# Patient Record
Sex: Female | Born: 1940 | Race: White | Hispanic: No | Marital: Married | Smoking: Never smoker
Health system: Southern US, Community
[De-identification: ages and names within clinical notes are randomized; demographics above are authoritative.]

## PROBLEM LIST (undated history)

## (undated) DIAGNOSIS — J45909 Unspecified asthma, uncomplicated: Secondary | ICD-10-CM

## (undated) DIAGNOSIS — E7800 Pure hypercholesterolemia, unspecified: Secondary | ICD-10-CM

## (undated) DIAGNOSIS — Z862 Personal history of diseases of the blood and blood-forming organs and certain disorders involving the immune mechanism: Secondary | ICD-10-CM

## (undated) DIAGNOSIS — I2699 Other pulmonary embolism without acute cor pulmonale: Secondary | ICD-10-CM

## (undated) DIAGNOSIS — I639 Cerebral infarction, unspecified: Secondary | ICD-10-CM

## (undated) DIAGNOSIS — G629 Polyneuropathy, unspecified: Secondary | ICD-10-CM

## (undated) DIAGNOSIS — C801 Malignant (primary) neoplasm, unspecified: Secondary | ICD-10-CM

## (undated) DIAGNOSIS — G454 Transient global amnesia: Principal | ICD-10-CM

## (undated) DIAGNOSIS — J189 Pneumonia, unspecified organism: Secondary | ICD-10-CM

## (undated) DIAGNOSIS — C4491 Basal cell carcinoma of skin, unspecified: Secondary | ICD-10-CM

## (undated) DIAGNOSIS — K219 Gastro-esophageal reflux disease without esophagitis: Secondary | ICD-10-CM

## (undated) DIAGNOSIS — E669 Obesity, unspecified: Secondary | ICD-10-CM

## (undated) DIAGNOSIS — J449 Chronic obstructive pulmonary disease, unspecified: Secondary | ICD-10-CM

## (undated) DIAGNOSIS — R112 Nausea with vomiting, unspecified: Secondary | ICD-10-CM

## (undated) DIAGNOSIS — M1990 Unspecified osteoarthritis, unspecified site: Secondary | ICD-10-CM

## (undated) DIAGNOSIS — R002 Palpitations: Secondary | ICD-10-CM

## (undated) DIAGNOSIS — Z9889 Other specified postprocedural states: Secondary | ICD-10-CM

## (undated) DIAGNOSIS — U071 COVID-19: Secondary | ICD-10-CM

## (undated) DIAGNOSIS — G4733 Obstructive sleep apnea (adult) (pediatric): Secondary | ICD-10-CM

## (undated) DIAGNOSIS — R0602 Shortness of breath: Secondary | ICD-10-CM

## (undated) DIAGNOSIS — G459 Transient cerebral ischemic attack, unspecified: Secondary | ICD-10-CM

## (undated) DIAGNOSIS — Z8669 Personal history of other diseases of the nervous system and sense organs: Secondary | ICD-10-CM

## (undated) HISTORY — PX: KNEE SURGERY: SHX244

## (undated) HISTORY — PX: TUBAL LIGATION: SHX77

## (undated) HISTORY — DX: Transient global amnesia: G45.4

## (undated) HISTORY — PX: DILATION AND CURETTAGE OF UTERUS: SHX78

## (undated) HISTORY — PX: TONSILLECTOMY: SUR1361

## (undated) HISTORY — PX: COLONOSCOPY: SHX174

## (undated) HISTORY — PX: LUMBAR FUSION: SHX111

## (undated) HISTORY — PX: BREAST CYST EXCISION: SHX579

## (undated) HISTORY — DX: Obesity, unspecified: E66.9

## (undated) HISTORY — DX: Transient cerebral ischemic attack, unspecified: G45.9

## (undated) HISTORY — PX: BREAST BIOPSY: SHX20

---

## 1969-05-20 HISTORY — PX: FINGER SURGERY: SHX640

## 1989-05-20 DIAGNOSIS — Z8719 Personal history of other diseases of the digestive system: Secondary | ICD-10-CM

## 1989-05-20 HISTORY — DX: Personal history of other diseases of the digestive system: Z87.19

## 1999-02-07 ENCOUNTER — Other Ambulatory Visit: Admission: RE | Admit: 1999-02-07 | Discharge: 1999-02-07 | Payer: Self-pay | Admitting: *Deleted

## 1999-04-11 ENCOUNTER — Other Ambulatory Visit: Admission: RE | Admit: 1999-04-11 | Discharge: 1999-04-11 | Payer: Self-pay | Admitting: Family Medicine

## 1999-10-04 ENCOUNTER — Ambulatory Visit (HOSPITAL_COMMUNITY): Admission: RE | Admit: 1999-10-04 | Discharge: 1999-10-04 | Payer: Self-pay | Admitting: *Deleted

## 1999-10-18 ENCOUNTER — Encounter: Admission: RE | Admit: 1999-10-18 | Discharge: 1999-10-18 | Payer: Self-pay | Admitting: *Deleted

## 1999-10-18 ENCOUNTER — Encounter: Payer: Self-pay | Admitting: *Deleted

## 1999-10-24 ENCOUNTER — Other Ambulatory Visit: Admission: RE | Admit: 1999-10-24 | Discharge: 1999-10-24 | Payer: Self-pay | Admitting: *Deleted

## 2000-09-05 ENCOUNTER — Ambulatory Visit: Admission: RE | Admit: 2000-09-05 | Discharge: 2000-09-05 | Payer: Self-pay | Admitting: Pulmonary Disease

## 2000-11-18 ENCOUNTER — Other Ambulatory Visit: Admission: RE | Admit: 2000-11-18 | Discharge: 2000-11-18 | Payer: Self-pay | Admitting: Obstetrics and Gynecology

## 2001-12-08 ENCOUNTER — Other Ambulatory Visit: Admission: RE | Admit: 2001-12-08 | Discharge: 2001-12-08 | Payer: Self-pay | Admitting: Obstetrics and Gynecology

## 2003-01-06 ENCOUNTER — Other Ambulatory Visit: Admission: RE | Admit: 2003-01-06 | Discharge: 2003-01-06 | Payer: Self-pay | Admitting: Obstetrics and Gynecology

## 2003-03-29 ENCOUNTER — Ambulatory Visit (HOSPITAL_COMMUNITY): Admission: RE | Admit: 2003-03-29 | Discharge: 2003-03-29 | Payer: Self-pay | Admitting: Oncology

## 2004-07-16 ENCOUNTER — Other Ambulatory Visit: Admission: RE | Admit: 2004-07-16 | Discharge: 2004-07-16 | Payer: Self-pay | Admitting: Obstetrics and Gynecology

## 2005-10-23 ENCOUNTER — Observation Stay (HOSPITAL_COMMUNITY): Admission: EM | Admit: 2005-10-23 | Discharge: 2005-10-24 | Payer: Self-pay | Admitting: Emergency Medicine

## 2008-04-26 ENCOUNTER — Encounter: Payer: Self-pay | Admitting: Cardiovascular Disease

## 2008-11-07 ENCOUNTER — Encounter (INDEPENDENT_AMBULATORY_CARE_PROVIDER_SITE_OTHER): Payer: Self-pay | Admitting: Orthopaedic Surgery

## 2008-11-07 ENCOUNTER — Ambulatory Visit: Admission: RE | Admit: 2008-11-07 | Discharge: 2008-11-07 | Payer: Self-pay | Admitting: Orthopaedic Surgery

## 2008-11-07 ENCOUNTER — Ambulatory Visit: Payer: Self-pay | Admitting: Surgery

## 2009-02-15 ENCOUNTER — Encounter: Payer: Self-pay | Admitting: Cardiovascular Disease

## 2009-10-12 ENCOUNTER — Encounter: Payer: Self-pay | Admitting: Cardiovascular Disease

## 2010-01-11 ENCOUNTER — Encounter: Payer: Self-pay | Admitting: Cardiovascular Disease

## 2010-02-28 DIAGNOSIS — G603 Idiopathic progressive neuropathy: Secondary | ICD-10-CM

## 2010-02-28 DIAGNOSIS — G4733 Obstructive sleep apnea (adult) (pediatric): Secondary | ICD-10-CM

## 2010-02-28 DIAGNOSIS — R413 Other amnesia: Secondary | ICD-10-CM

## 2010-02-28 DIAGNOSIS — J45909 Unspecified asthma, uncomplicated: Secondary | ICD-10-CM

## 2010-03-01 ENCOUNTER — Ambulatory Visit: Payer: Self-pay | Admitting: Cardiovascular Disease

## 2010-03-01 DIAGNOSIS — R0789 Other chest pain: Secondary | ICD-10-CM | POA: Insufficient documentation

## 2010-03-01 DIAGNOSIS — R0602 Shortness of breath: Secondary | ICD-10-CM

## 2010-03-01 DIAGNOSIS — R9431 Abnormal electrocardiogram [ECG] [EKG]: Secondary | ICD-10-CM

## 2010-06-19 NOTE — Letter (Signed)
Summary: [facility]  [facility]   Imported By: Marylou Mccoy 02/28/2010 15:23:43  _____________________________________________________________________  External Attachment:    Type:   Image     Comment:   External Document

## 2010-06-19 NOTE — Letter (Signed)
Summary: [facility]   Imported By: Marylou Mccoy 02/28/2010 15:20:24  _____________________________________________________________________  External Attachment:    Type:   Image     Comment:   External Document

## 2010-06-19 NOTE — Assessment & Plan Note (Signed)
Summary: NP6/CARDIAC WORKUP   CC:  referal from Dr. Clarene Duke...pt has been sob.  History of Present Illness: Christy Stephenson is seen today at the request of Dr Clarene Duke.  She has profound exhaustion and dyspnea with an abnormal ECG.  Positive family history for CAD.  Denies depression or previous cardiac problems.  Non smoker with no chronic lung diseae.  Indicates routine blood work and thyroid have been normal.  Sedentary.  Denies SSCP but has a sense of heaviness on bad days.  Symptoms can last all day and resolve the next.  Feels exhausted with dyspnea and "pressure" that is not exertional.  No cardiac tests in the last 5 years.  Reviewed ECG from Dr Clarene Duke' office. 10/12/09  normal except for poor R wave progression and low voltage likely related to lead placement and body habitus.  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 1 Tab By Mouth Once Daily 2)  Neurontin 300 Mg Caps (Gabapentin) .Marland Kitchen.. 1 Tab By Mouth Two Times A Day 3)  Aspirin 81 Mg  Tabs (Aspirin) .Marland Kitchen.. 1 Tab By Mouth Once Daily 4)  Calcium-Vitamin D 600-200 Mg-Unit Tabs (Calcium-Vitamin D) .Marland Kitchen.. 1 Tab By Mouth Once Daily 5)  Vitamin E 400 Unit Caps (Vitamin E) .Marland Kitchen.. 1 Tab By Mouth Once Daily 6)  Prilosec 20 Mg Cpdr (Omeprazole) .Marland Kitchen.. 1 Tab By Mouth Once Daily 7)  Advair Diskus 100-50 Mcg/dose Aepb (Fluticasone-Salmeterol) .... As Directed 8)  Diclofenac Sodium 75 Mg Tbec (Diclofenac Sodium) .Marland Kitchen.. 1  Tab By Mouth Two Times A Day 9)  Estraderm 0.05 Mg/24hr Pttw (Estradiol) .... Monthly  Allergies (verified): 1)  ! Actifed Cold/allergy (Chlorpheniramine-Phenylephrine) 2)  ! Wellbutrin  Past History:  Past Medical History: Last updated: 02/28/2010 ASTHMA  HYPERSOMNIA UNSPECIFIED  IDIOPATHIC  PROGRESSIVE POLYNEUROPATHY MEMORY LOSS   Past Surgical History: Last updated: 02/28/2010  Esophagogastroduodenoscopy.  Tonsillectomy and adenoidectomy.   D&C.  Family History: Last updated: 02/28/2010 father: lung cancer Mother: Diabetes, Thyroid  Social History: Last updated: 02/28/2010  The patient denies alcohol or tobacco use.  She is a  retired [employer] fundraiser.  Review of Systems       Denies fever, malais, weight loss, blurry vision, decreased visual acuity, cough, sputum,  hemoptysis, pleuritic pain, palpitaitons, heartburn, abdominal pain, melena, lower extremity edema, claudication, or rash.   Vital Signs:  Patient profile:   70 year old female Height:      65 inches Weight:      220 pounds BMI:     36.74 Pulse rate:   74 / minute Resp:     14 per minute BP sitting:   130 / 80  (left arm)  Vitals Entered By: Kem Parkinson (March 01, 2010 9:17 AM)  Physical Exam  General:  Affect appropriate Healthy:  appears stated age HEENT: normal Neck supple with no adenopathy JVP normal no bruits no thyromegaly Lungs clear with no wheezing and good diaphragmatic motion Heart:  S1/S2 no murmur,rub, gallop or click PMI normal Abdomen: benighn, BS positve, no tenderness, no AAA no bruit.  No HSM or HJR Distal pulses intact with no bruits No edema Neuro non-focal Skin warm and dry    Impression & Recommendations:  Problem # 1:  DYSPNEA (ICD-786.05) Check echo and CPX.  No evidence of severe cardiopulmonary disease  Her updated medication list for this problem includes:    Aspirin 81 Mg Tabs (Aspirin) .Marland Kitchen... 1 tab by mouth once daily  Orders: CPX Test at [facility] (CPX Test) Echocardiogram (Echo)  Problem # 2:  OTHER CHEST PAIN (ICD-786.59) Very atypical with limited risk factors.  CPX and echo for other symtoms but dont think myovue indicated Her updated medication list for this problem includes:    Aspirin 81 Mg Tabs (Aspirin) .Marland Kitchen... 1 tab by mouth once  daily  Problem # 3:  ELECTROCARDIOGRAM, ABNORMAL (ICD-794.31) Voltage and progression likely from body habitus and lead placement  Echo to R/O RWMA's and DCM Her updated medication list for this problem includes:    Aspirin 81 Mg Tabs (Aspirin) .Marland Kitchen... 1 tab by mouth once daily  Patient Instructions: 1)  Your physician recommends that you schedule a follow-up appointment in: AFTER TESTS DONE 2)  Your physician recommends that you continue on your current medications as directed. Please refer to the Current Medication list given to you today. 3)  Your physician has requested that you have an echocardiogram.  Echocardiography is a painless test that uses sound waves to create images of your heart. It provides your doctor with information about the size and shape of your heart and how well your heart's chambers and valves are working.  This procedure takes approximately one hour. There are no restrictions for this procedure. 4)  Your physician has recommended that you have a cardiopulmonary stress test (CPX).  CPX testing is a non-invasive measurement of heart and lung function. It replaces a traditional treadmill stress test. This type of test provides a tremendous amount of information that relates not only to your present condition but also for future outcomes.  This test combines measurements of your ventilation, respiratory gas exchange in the lungs, electrocardiogram (EKG), blood pressure and physical response before, during, and following an exercise protocol.   EKG Report  Procedure date:  10/12/2009  Findings:      NSR  Low voltage Poor R wave progression

## 2010-10-05 NOTE — Cardiovascular Report (Signed)
[facility]  Patient:    Christy Stephenson, Christy Stephenson                  MRN: [id number] Proc. Date: 10/04/99 Adm. Date:  [id number] Disc. Date: [id number] Attending:  Mora Appl CC:         Thereasa Solo. Little, M.D.                        Cardiac Catheterization  INDICATIONS FOR PROCEDURE:  Chest pain and small area of reversible ischemia in anterior apex associated with EKG changes.  REFERRING PHYSICIAN:  Gaspar Garbe B. Little, M.D.  DESCRIPTION OF PROCEDURE:  After obtaining written and informed consent, the patient was brought to the cardiac catheterization lab in a postabsorptive state.  Sedation was achieved using IV Versed.  The right femoral _____ was identified using radiographic technique.  A 6-French hemostasis sheath was placed into the right femoral artery using modified Seldinger technique. Selective coronary angiography was performed using a JL4 and JR4 Judkins catheter.  Nonionic contrast was used and was hand injected.  A single plane ventriculogram was performed in the RAO position using a 6-French pigtail curved catheter.  All catheter exchanges were made over a guidewire and hemostasis was placed following each engagement.  Following review of the films, there was no identifiable disease.  Hemostasis sheath was removed and hemostasis was achieved using digital pressure.  FINDINGS:  The aortic pressure was 126/64, LV pressure is 133/15.  Single plane ventriculogram revealed normal wall motion with an ejection fraction of approximately 70%.  Left main coronary artery bifurcated into the left anterior descending and circumflex vessel.  There was no significant disease in the left main coronary artery.  Left anterior descending - the left anterior descending gave rise to a moderate D1, small D2, small D3, and into an apical recurrent branch.  There is no significant disease in the left anterior descending or its branches.  Circumflex vessel  - the circumflex vessel gave rise to a large trifurcating OM1, and a small A-V groove vessel.  There is no significant disease in the circumflex vessel.  Right coronary artery - the right coronary artery was dominant.  It gave rise to a small R-V marginal #1 and small R-V marginal #2, and it has a PDA vessel. There was no significant disease in the right coronary artery or its branches.  IMPRESSION:  Normal coronary angiography, normal ventriculogram ________ positive stress test.  RECOMMENDATION:  Consider other etiologies of chest pain. DD:  10/04/99 TD:  10/08/99 Job: [id number]

## 2010-10-05 NOTE — H&P (Signed)
[name], Stephenson                ACCOUNT NO.:  [id number]   MEDICAL RECORD NO.:  [id number]          PATIENT TYPE:  EMS   LOCATION:  MAJO                         FACILITY:  [facility]   PHYSICIAN:  John C. Madilyn Fireman, M.D.    DATE OF BIRTH:  Jul 27, 1940   DATE OF ADMISSION:  10/23/2005  DATE OF DISCHARGE:                                HISTORY & PHYSICAL   CHIEF COMPLAINT:  Melena.   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  She had  absolutely no abdominal pain, weakness, dizziness, or any orthostatic  symptoms at all.  Her vital signs are stable here, and her hemoglobin is  14.4 with a BUN of 16, PT 12.6.  She is on Celebrex 200 mg a day for 2-3  years and takes a baby aspirin a day.  She has also been on Nexium 40 mg a  day for 2 months for heartburn.   PAST MEDICAL HISTORY:  Asthma.   SURGERY:  1.  Tonsillectomy and adenoidectomy.  2.  D&C.   ALLERGIES:  ACTIFED.   SOCIAL HISTORY:  The patient denies alcohol or tobacco use.  She is a  retired [employer] fundraiser.   MEDICINES:  Celebrex, baby aspirin, Nexium 40 mg a day.   PHYSICAL EXAMINATION:  GENERAL:  A well-developed, well-nourished, white  female in no acute distress.  HEART:  Regular rate and rhythm without murmur.  LUNGS:  Clear.  ABDOMEN:  Soft, nondistended with normoactive bowel sounds.  No  hepatosplenomegaly, mass, or guarding.  RECTAL:  Reveals a semiformed maroon to black stool with a red tinge  strongly heme positive.   IMPRESSION:  Gastrointestinal bleeding, unclear whether upper or lower.   PLAN:  We will proceed with EGD today and treat accordingly.  If this is  unremarkable, we will keep in the hospital and prep for colonoscopy tomorrow  or the next day.           ______________________________  Everardo All. Madilyn Fireman, M.D.     JCH/MEDQ  D:  10/23/2005   T:  10/23/2005  Job:  [id number]   cc:   Christy Stephenson, M.D.  Fax: [phone]

## 2010-10-05 NOTE — Op Note (Signed)
NAMEUNNAMED, HINO                ACCOUNT NO.:  [id number]   MEDICAL RECORD NO.:  [id number]          PATIENT TYPE:  OBV   LOCATION:  5739                         FACILITY:  [facility]   PHYSICIAN:  John C. Madilyn Fireman, M.D.    DATE OF BIRTH:  [birth year]   DATE OF PROCEDURE:  10/24/2005  DATE OF DISCHARGE:                                 OPERATIVE REPORT   INDICATIONS FOR PROCEDURE:  GI bleeding with normal endoscopy.   PROCEDURE:  The patient was placed in the left lateral decubitus position  and placed on the pulse monitor with continuous low-flow oxygen delivered by  nasal cannula.  She was sedated with 75 mcg IV fentanyl and 6 mg IV Versed.  Olympus video colonoscope was inserted into the rectum and advanced to  cecum, confirmed by transillumination of McBurney's point and visualization  of ileocecal valve and appendiceal orifice.  Prep was excellent.  The cecum,  ascending and transverse all appeared normal with no masses, polyps,  diverticula or other mucosal abnormalities.  There were a few small  diverticula in the descending colon, a few more in the sigmoid.  There was  no stigma of hemorrhage, no old or fresh blood seen in the colon.  The  rectum appeared normal.  Retroflexed view of the anus revealed no obvious  internal hemorrhoids.  Scope was then withdrawn and the patient returned to  the recovery room in stable condition.  She tolerated the procedure well.  There were no immediate complications.   IMPRESSION:  1.  Left-sided diverticulosis.  2.  Internal hemorrhoids.   PLAN:  Observe for further bleeding, consider capsule endoscopy if she  presents at again.           ______________________________  Everardo All. Madilyn Fireman, M.D.     JCH/MEDQ  D:  10/24/2005  T:  10/25/2005  Job:  [id number]

## 2010-10-05 NOTE — Op Note (Signed)
Christy Stephenson, Christy Stephenson                ACCOUNT NO.:  [id number]   MEDICAL RECORD NO.:  [id number]          PATIENT TYPE:  OBV   LOCATION:  5739                         FACILITY:  [facility]   PHYSICIAN:  John C. Madilyn Fireman, M.D.    DATE OF BIRTH:  06/17/1940   DATE OF PROCEDURE:  10/23/2005  DATE OF DISCHARGE:                                 OPERATIVE REPORT   PROCEDURE:  Esophagogastroduodenoscopy.   INDICATIONS FOR PROCEDURE:  Melenic stools beginning today.   PROCEDURE:  The patient was placed in the left lateral decubitus position  and placed on the pulse monitor with continuous low-flow oxygen delivered by  nasal cannula.  She was sedated with 75 mcg IV fentanyl and 7.5 mg IV  Versed.  Olympus video endoscope was advanced under direct vision into the  oropharynx and esophagus.  The esophagus was straight and of normal caliber.  The squamocolumnar line was at 38 cm above a 2-cm hiatal hernia.  There is  no visible esophagitis or other abnormality of the GE junction or esophagus.  The stomach was entered, and a small amount of liquid secretions were  suctioned from the fundus.  Retroflexed view of cardia was unremarkable.  The fundus, body, antrum and pylorus all appeared normal.  The duodenum was  entered and both bulb and second portions were well inspected and appeared  to be within normal limits.  The scope was then withdrawn, and the patient  returned to the recovery room in stable condition.  She tolerated the  procedure well.  There were no immediate complications.   IMPRESSION:  Small hiatal hernia, otherwise normal study.   PLAN:  We will proceed to colonoscopy tomorrow.           ______________________________  Everardo All Madilyn Fireman, M.D.     JCH/MEDQ  D:  10/23/2005  T:  10/24/2005  Job:  [id number]   cc:   Marcelino Duster L. Vincente Poli, M.D.  Fax: [phone]

## 2010-12-07 ENCOUNTER — Other Ambulatory Visit: Payer: Self-pay | Admitting: Neurosurgery

## 2010-12-07 DIAGNOSIS — M47816 Spondylosis without myelopathy or radiculopathy, lumbar region: Secondary | ICD-10-CM

## 2010-12-10 ENCOUNTER — Other Ambulatory Visit: Payer: Self-pay

## 2010-12-24 ENCOUNTER — Ambulatory Visit
Admission: RE | Admit: 2010-12-24 | Discharge: 2010-12-24 | Disposition: A | Discharge status: Home / Self Care | Payer: Medicare Other | Source: Ambulatory Visit | Attending: Neurosurgery | Admitting: Neurosurgery

## 2010-12-24 DIAGNOSIS — M47816 Spondylosis without myelopathy or radiculopathy, lumbar region: Secondary | ICD-10-CM

## 2010-12-24 MED ORDER — IOHEXOL 180 MG/ML  SOLN
1.0000 mL | Freq: Once | INTRAMUSCULAR | Status: AC | PRN
  Administered 2010-12-24: 1 mL via EPIDURAL

## 2010-12-24 MED ORDER — METHYLPREDNISOLONE ACETATE 40 MG/ML INJ SUSP (RADIOLOG
120.0000 mg | Freq: Once | INTRAMUSCULAR | Status: AC
  Administered 2010-12-24: 120 mg via EPIDURAL

## 2011-05-21 HISTORY — PX: OTHER SURGICAL HISTORY: SHX169

## 2011-06-04 DIAGNOSIS — R319 Hematuria, unspecified: Secondary | ICD-10-CM | POA: Diagnosis not present

## 2011-06-04 DIAGNOSIS — R7989 Other specified abnormal findings of blood chemistry: Secondary | ICD-10-CM | POA: Diagnosis not present

## 2011-06-04 DIAGNOSIS — E785 Hyperlipidemia, unspecified: Secondary | ICD-10-CM | POA: Diagnosis not present

## 2011-06-04 DIAGNOSIS — R5381 Other malaise: Secondary | ICD-10-CM | POA: Diagnosis not present

## 2011-06-04 DIAGNOSIS — R7309 Other abnormal glucose: Secondary | ICD-10-CM | POA: Diagnosis not present

## 2011-06-04 DIAGNOSIS — E7800 Pure hypercholesterolemia, unspecified: Secondary | ICD-10-CM | POA: Diagnosis not present

## 2011-06-11 DIAGNOSIS — M1710 Unilateral primary osteoarthritis, unspecified knee: Secondary | ICD-10-CM | POA: Diagnosis not present

## 2011-06-11 DIAGNOSIS — M545 Low back pain: Secondary | ICD-10-CM | POA: Diagnosis not present

## 2011-06-11 DIAGNOSIS — R5383 Other fatigue: Secondary | ICD-10-CM | POA: Diagnosis not present

## 2011-06-11 DIAGNOSIS — R319 Hematuria, unspecified: Secondary | ICD-10-CM | POA: Diagnosis not present

## 2011-06-11 DIAGNOSIS — E162 Hypoglycemia, unspecified: Secondary | ICD-10-CM | POA: Diagnosis not present

## 2011-06-11 DIAGNOSIS — E785 Hyperlipidemia, unspecified: Secondary | ICD-10-CM | POA: Diagnosis not present

## 2011-06-11 DIAGNOSIS — E7800 Pure hypercholesterolemia, unspecified: Secondary | ICD-10-CM | POA: Diagnosis not present

## 2011-06-11 DIAGNOSIS — R7989 Other specified abnormal findings of blood chemistry: Secondary | ICD-10-CM | POA: Diagnosis not present

## 2011-06-11 DIAGNOSIS — R5381 Other malaise: Secondary | ICD-10-CM | POA: Diagnosis not present

## 2011-07-02 DIAGNOSIS — M6281 Muscle weakness (generalized): Secondary | ICD-10-CM | POA: Diagnosis not present

## 2011-07-02 DIAGNOSIS — N8111 Cystocele, midline: Secondary | ICD-10-CM | POA: Diagnosis not present

## 2011-07-02 DIAGNOSIS — R279 Unspecified lack of coordination: Secondary | ICD-10-CM | POA: Diagnosis not present

## 2011-07-02 DIAGNOSIS — N3946 Mixed incontinence: Secondary | ICD-10-CM | POA: Diagnosis not present

## 2011-07-17 DIAGNOSIS — M6281 Muscle weakness (generalized): Secondary | ICD-10-CM | POA: Diagnosis not present

## 2011-07-17 DIAGNOSIS — R350 Frequency of micturition: Secondary | ICD-10-CM | POA: Diagnosis not present

## 2011-07-17 DIAGNOSIS — N3946 Mixed incontinence: Secondary | ICD-10-CM | POA: Diagnosis not present

## 2011-07-17 DIAGNOSIS — R279 Unspecified lack of coordination: Secondary | ICD-10-CM | POA: Diagnosis not present

## 2011-08-02 DIAGNOSIS — N816 Rectocele: Secondary | ICD-10-CM | POA: Diagnosis not present

## 2011-08-15 ENCOUNTER — Other Ambulatory Visit (HOSPITAL_COMMUNITY): Payer: Self-pay | Admitting: Family Medicine

## 2011-08-26 ENCOUNTER — Encounter (HOSPITAL_COMMUNITY): Payer: Medicare Other

## 2011-08-26 ENCOUNTER — Ambulatory Visit (HOSPITAL_COMMUNITY): Payer: Medicare Other

## 2011-08-29 ENCOUNTER — Ambulatory Visit (HOSPITAL_COMMUNITY): Payer: Medicare Other

## 2011-08-29 ENCOUNTER — Encounter (HOSPITAL_COMMUNITY)
Admission: RE | Admit: 2011-08-29 | Discharge: 2011-08-29 | Disposition: A | Discharge status: Home / Self Care | Payer: Medicare Other | Source: Ambulatory Visit | Attending: Family Medicine | Admitting: Family Medicine

## 2011-08-29 DIAGNOSIS — Z85828 Personal history of other malignant neoplasm of skin: Secondary | ICD-10-CM | POA: Diagnosis not present

## 2011-09-03 DIAGNOSIS — Z8781 Personal history of (healed) traumatic fracture: Secondary | ICD-10-CM | POA: Diagnosis not present

## 2011-09-03 DIAGNOSIS — Z8262 Family history of osteoporosis: Secondary | ICD-10-CM | POA: Diagnosis not present

## 2011-09-03 DIAGNOSIS — M899 Disorder of bone, unspecified: Secondary | ICD-10-CM | POA: Diagnosis not present

## 2011-09-03 DIAGNOSIS — R29890 Loss of height: Secondary | ICD-10-CM | POA: Diagnosis not present

## 2011-10-07 DIAGNOSIS — S20219A Contusion of unspecified front wall of thorax, initial encounter: Secondary | ICD-10-CM | POA: Diagnosis not present

## 2011-10-07 DIAGNOSIS — S298XXA Other specified injuries of thorax, initial encounter: Secondary | ICD-10-CM | POA: Diagnosis not present

## 2011-10-07 DIAGNOSIS — R0789 Other chest pain: Secondary | ICD-10-CM | POA: Diagnosis not present

## 2011-10-07 DIAGNOSIS — Y929 Unspecified place or not applicable: Secondary | ICD-10-CM | POA: Diagnosis not present

## 2011-10-07 DIAGNOSIS — W19XXXA Unspecified fall, initial encounter: Secondary | ICD-10-CM | POA: Diagnosis not present

## 2011-10-07 DIAGNOSIS — J984 Other disorders of lung: Secondary | ICD-10-CM | POA: Diagnosis not present

## 2011-10-15 DIAGNOSIS — E785 Hyperlipidemia, unspecified: Secondary | ICD-10-CM | POA: Diagnosis not present

## 2011-10-15 DIAGNOSIS — M1710 Unilateral primary osteoarthritis, unspecified knee: Secondary | ICD-10-CM | POA: Diagnosis not present

## 2011-10-15 DIAGNOSIS — R7989 Other specified abnormal findings of blood chemistry: Secondary | ICD-10-CM | POA: Diagnosis not present

## 2011-10-15 DIAGNOSIS — R5383 Other fatigue: Secondary | ICD-10-CM | POA: Diagnosis not present

## 2011-10-15 DIAGNOSIS — E7800 Pure hypercholesterolemia, unspecified: Secondary | ICD-10-CM | POA: Diagnosis not present

## 2011-10-15 DIAGNOSIS — R319 Hematuria, unspecified: Secondary | ICD-10-CM | POA: Diagnosis not present

## 2011-10-15 DIAGNOSIS — M545 Low back pain: Secondary | ICD-10-CM | POA: Diagnosis not present

## 2011-10-22 DIAGNOSIS — R319 Hematuria, unspecified: Secondary | ICD-10-CM | POA: Diagnosis not present

## 2011-10-22 DIAGNOSIS — M5430 Sciatica, unspecified side: Secondary | ICD-10-CM | POA: Diagnosis not present

## 2011-10-22 DIAGNOSIS — R945 Abnormal results of liver function studies: Secondary | ICD-10-CM | POA: Diagnosis not present

## 2011-10-22 DIAGNOSIS — R7989 Other specified abnormal findings of blood chemistry: Secondary | ICD-10-CM | POA: Diagnosis not present

## 2011-10-22 DIAGNOSIS — E785 Hyperlipidemia, unspecified: Secondary | ICD-10-CM | POA: Diagnosis not present

## 2011-11-28 DIAGNOSIS — E785 Hyperlipidemia, unspecified: Secondary | ICD-10-CM | POA: Diagnosis not present

## 2011-11-28 DIAGNOSIS — E669 Obesity, unspecified: Secondary | ICD-10-CM | POA: Diagnosis not present

## 2011-11-28 DIAGNOSIS — G609 Hereditary and idiopathic neuropathy, unspecified: Secondary | ICD-10-CM | POA: Diagnosis not present

## 2011-11-28 DIAGNOSIS — M899 Disorder of bone, unspecified: Secondary | ICD-10-CM | POA: Diagnosis not present

## 2011-11-28 DIAGNOSIS — J45909 Unspecified asthma, uncomplicated: Secondary | ICD-10-CM | POA: Diagnosis not present

## 2011-11-28 DIAGNOSIS — Z713 Dietary counseling and surveillance: Secondary | ICD-10-CM | POA: Diagnosis not present

## 2011-11-28 DIAGNOSIS — M549 Dorsalgia, unspecified: Secondary | ICD-10-CM | POA: Diagnosis not present

## 2012-02-17 DIAGNOSIS — H2510 Age-related nuclear cataract, unspecified eye: Secondary | ICD-10-CM | POA: Diagnosis not present

## 2012-03-20 DIAGNOSIS — Z1231 Encounter for screening mammogram for malignant neoplasm of breast: Secondary | ICD-10-CM | POA: Diagnosis not present

## 2012-03-23 DIAGNOSIS — H16229 Keratoconjunctivitis sicca, not specified as Sjogren's, unspecified eye: Secondary | ICD-10-CM | POA: Diagnosis not present

## 2012-03-23 DIAGNOSIS — H2510 Age-related nuclear cataract, unspecified eye: Secondary | ICD-10-CM | POA: Diagnosis not present

## 2012-03-23 DIAGNOSIS — H35369 Drusen (degenerative) of macula, unspecified eye: Secondary | ICD-10-CM | POA: Diagnosis not present

## 2012-04-01 DIAGNOSIS — R079 Chest pain, unspecified: Secondary | ICD-10-CM | POA: Diagnosis not present

## 2012-04-07 DIAGNOSIS — H2510 Age-related nuclear cataract, unspecified eye: Secondary | ICD-10-CM | POA: Diagnosis not present

## 2012-04-07 DIAGNOSIS — H2589 Other age-related cataract: Secondary | ICD-10-CM | POA: Diagnosis not present

## 2012-04-07 DIAGNOSIS — H269 Unspecified cataract: Secondary | ICD-10-CM | POA: Diagnosis not present

## 2012-05-14 DIAGNOSIS — H2510 Age-related nuclear cataract, unspecified eye: Secondary | ICD-10-CM | POA: Diagnosis not present

## 2012-05-14 DIAGNOSIS — R0789 Other chest pain: Secondary | ICD-10-CM | POA: Diagnosis not present

## 2012-05-20 DIAGNOSIS — G459 Transient cerebral ischemic attack, unspecified: Secondary | ICD-10-CM

## 2012-05-20 HISTORY — PX: EYE SURGERY: SHX253

## 2012-05-20 HISTORY — DX: Transient cerebral ischemic attack, unspecified: G45.9

## 2012-06-02 DIAGNOSIS — H2510 Age-related nuclear cataract, unspecified eye: Secondary | ICD-10-CM | POA: Diagnosis not present

## 2012-06-02 DIAGNOSIS — H269 Unspecified cataract: Secondary | ICD-10-CM | POA: Diagnosis not present

## 2012-06-10 DIAGNOSIS — J45909 Unspecified asthma, uncomplicated: Secondary | ICD-10-CM | POA: Diagnosis not present

## 2012-06-10 DIAGNOSIS — E785 Hyperlipidemia, unspecified: Secondary | ICD-10-CM | POA: Diagnosis not present

## 2012-06-10 DIAGNOSIS — E669 Obesity, unspecified: Secondary | ICD-10-CM | POA: Diagnosis not present

## 2012-06-10 DIAGNOSIS — R9431 Abnormal electrocardiogram [ECG] [EKG]: Secondary | ICD-10-CM | POA: Diagnosis not present

## 2012-06-10 DIAGNOSIS — R0789 Other chest pain: Secondary | ICD-10-CM | POA: Diagnosis not present

## 2012-06-19 DIAGNOSIS — R9431 Abnormal electrocardiogram [ECG] [EKG]: Secondary | ICD-10-CM | POA: Diagnosis not present

## 2012-06-19 DIAGNOSIS — E785 Hyperlipidemia, unspecified: Secondary | ICD-10-CM | POA: Diagnosis not present

## 2012-06-19 DIAGNOSIS — E669 Obesity, unspecified: Secondary | ICD-10-CM | POA: Diagnosis not present

## 2012-06-19 DIAGNOSIS — R0789 Other chest pain: Secondary | ICD-10-CM | POA: Diagnosis not present

## 2012-06-19 DIAGNOSIS — J45909 Unspecified asthma, uncomplicated: Secondary | ICD-10-CM | POA: Diagnosis not present

## 2012-06-30 ENCOUNTER — Other Ambulatory Visit: Payer: Self-pay | Admitting: Interventional Cardiology

## 2012-06-30 DIAGNOSIS — E785 Hyperlipidemia, unspecified: Secondary | ICD-10-CM | POA: Diagnosis not present

## 2012-06-30 DIAGNOSIS — R9439 Abnormal result of other cardiovascular function study: Secondary | ICD-10-CM | POA: Diagnosis not present

## 2012-06-30 DIAGNOSIS — R9431 Abnormal electrocardiogram [ECG] [EKG]: Secondary | ICD-10-CM | POA: Diagnosis not present

## 2012-06-30 DIAGNOSIS — R9430 Abnormal result of cardiovascular function study, unspecified: Secondary | ICD-10-CM | POA: Diagnosis not present

## 2012-06-30 DIAGNOSIS — R072 Precordial pain: Secondary | ICD-10-CM | POA: Diagnosis not present

## 2012-07-01 DIAGNOSIS — R300 Dysuria: Secondary | ICD-10-CM | POA: Diagnosis not present

## 2012-07-01 DIAGNOSIS — R9439 Abnormal result of other cardiovascular function study: Secondary | ICD-10-CM | POA: Diagnosis not present

## 2012-07-01 DIAGNOSIS — J45909 Unspecified asthma, uncomplicated: Secondary | ICD-10-CM | POA: Diagnosis not present

## 2012-07-01 DIAGNOSIS — N390 Urinary tract infection, site not specified: Secondary | ICD-10-CM | POA: Diagnosis not present

## 2012-07-02 ENCOUNTER — Ambulatory Visit (HOSPITAL_COMMUNITY)
Admission: RE | Admit: 2012-07-02 | Payer: Medicare Other | Source: Ambulatory Visit | Admitting: Interventional Cardiology

## 2012-07-02 ENCOUNTER — Encounter (HOSPITAL_COMMUNITY): Admission: RE | Payer: Self-pay | Source: Ambulatory Visit

## 2012-07-02 SURGERY — LEFT HEART CATHETERIZATION WITH CORONARY ANGIOGRAM
Anesthesia: LOCAL

## 2012-07-09 ENCOUNTER — Ambulatory Visit (HOSPITAL_COMMUNITY)
Admission: RE | Admit: 2012-07-09 | Payer: Medicare Other | Source: Ambulatory Visit | Admitting: Interventional Cardiology

## 2012-07-09 ENCOUNTER — Encounter (HOSPITAL_COMMUNITY): Payer: Self-pay | Admitting: *Deleted

## 2012-07-09 ENCOUNTER — Encounter (HOSPITAL_COMMUNITY)
Admission: EM | Disposition: A | Discharge status: Home / Self Care | Payer: Self-pay | Source: Home / Self Care | Attending: Emergency Medicine

## 2012-07-09 ENCOUNTER — Observation Stay (HOSPITAL_COMMUNITY)
Admission: EM | Admit: 2012-07-09 | Discharge: 2012-07-09 | Disposition: A | Discharge status: Home / Self Care | Payer: Medicare Other | Attending: Interventional Cardiology | Admitting: Interventional Cardiology

## 2012-07-09 ENCOUNTER — Other Ambulatory Visit: Payer: Self-pay

## 2012-07-09 DIAGNOSIS — R079 Chest pain, unspecified: Principal | ICD-10-CM | POA: Insufficient documentation

## 2012-07-09 DIAGNOSIS — I2510 Atherosclerotic heart disease of native coronary artery without angina pectoris: Secondary | ICD-10-CM | POA: Insufficient documentation

## 2012-07-09 DIAGNOSIS — I10 Essential (primary) hypertension: Secondary | ICD-10-CM | POA: Diagnosis not present

## 2012-07-09 DIAGNOSIS — E669 Obesity, unspecified: Secondary | ICD-10-CM | POA: Diagnosis not present

## 2012-07-09 DIAGNOSIS — E785 Hyperlipidemia, unspecified: Secondary | ICD-10-CM | POA: Diagnosis not present

## 2012-07-09 DIAGNOSIS — I748 Embolism and thrombosis of other arteries: Secondary | ICD-10-CM | POA: Insufficient documentation

## 2012-07-09 DIAGNOSIS — R0609 Other forms of dyspnea: Secondary | ICD-10-CM | POA: Diagnosis not present

## 2012-07-09 DIAGNOSIS — G4733 Obstructive sleep apnea (adult) (pediatric): Secondary | ICD-10-CM | POA: Diagnosis not present

## 2012-07-09 DIAGNOSIS — R0989 Other specified symptoms and signs involving the circulatory and respiratory systems: Secondary | ICD-10-CM | POA: Insufficient documentation

## 2012-07-09 DIAGNOSIS — R0982 Postnasal drip: Secondary | ICD-10-CM | POA: Diagnosis not present

## 2012-07-09 DIAGNOSIS — R9439 Abnormal result of other cardiovascular function study: Secondary | ICD-10-CM | POA: Diagnosis present

## 2012-07-09 HISTORY — DX: Unspecified asthma, uncomplicated: J45.909

## 2012-07-09 HISTORY — PX: CARDIAC CATHETERIZATION: SHX172

## 2012-07-09 HISTORY — DX: Obstructive sleep apnea (adult) (pediatric): G47.33

## 2012-07-09 HISTORY — DX: Gastro-esophageal reflux disease without esophagitis: K21.9

## 2012-07-09 HISTORY — DX: Unspecified osteoarthritis, unspecified site: M19.90

## 2012-07-09 HISTORY — DX: Polyneuropathy, unspecified: G62.9

## 2012-07-09 HISTORY — PX: LEFT HEART CATHETERIZATION WITH CORONARY ANGIOGRAM: SHX5451

## 2012-07-09 HISTORY — DX: Pure hypercholesterolemia, unspecified: E78.00

## 2012-07-09 HISTORY — DX: Shortness of breath: R06.02

## 2012-07-09 LAB — CBC
MCV: 91.7 fL (ref 78.0–100.0)
Platelets: 220 10*3/uL (ref 150–400)
RBC: 4.47 MIL/uL (ref 3.87–5.11)
WBC: 6.9 10*3/uL (ref 4.0–10.5)

## 2012-07-09 LAB — TROPONIN I: Troponin I: <0.3 ng/mL (ref <0.30)

## 2012-07-09 LAB — BASIC METABOLIC PANEL
CO2: 27 mEq/L (ref 19–32)
Chloride: 107 mEq/L (ref 96–112)
Creatinine, Ser: 0.86 mg/dL (ref 0.50–1.10)
Potassium: 4 mEq/L (ref 3.5–5.1)
Sodium: 143 mEq/L (ref 135–145)

## 2012-07-09 LAB — PROTIME-INR
INR: 0.91 (ref 0.00–1.49)
Prothrombin Time: 12.2 seconds (ref 11.6–15.2)

## 2012-07-09 SURGERY — LEFT HEART CATHETERIZATION WITH CORONARY ANGIOGRAM
Anesthesia: LOCAL

## 2012-07-09 MED ORDER — PANTOPRAZOLE SODIUM 40 MG PO TBEC: 40.0000 mg | DELAYED_RELEASE_TABLET | Freq: Every day | ORAL | Status: DC

## 2012-07-09 MED ORDER — DIAZEPAM 5 MG PO TABS
5.0000 mg | ORAL_TABLET | ORAL | Status: AC
  Administered 2012-07-09: 5 mg via ORAL
  Filled 2012-07-09: qty 1

## 2012-07-09 MED ORDER — ASPIRIN 81 MG PO CHEW: 243.0000 mg | CHEWABLE_TABLET | Freq: Once | ORAL | Status: AC

## 2012-07-09 MED ORDER — SODIUM CHLORIDE 0.9 % IV SOLN: 250.0000 mL | INTRAVENOUS | Status: DC | PRN

## 2012-07-09 MED ORDER — SODIUM CHLORIDE 0.9 % IV SOLN: INTRAVENOUS | Status: AC

## 2012-07-09 MED ORDER — VERAPAMIL HCL 2.5 MG/ML IV SOLN
INTRAVENOUS | Status: AC
  Filled 2012-07-09: qty 2

## 2012-07-09 MED ORDER — ALBUTEROL SULFATE HFA 108 (90 BASE) MCG/ACT IN AERS: 2.0000 | INHALATION_SPRAY | Freq: Four times a day (QID) | RESPIRATORY_TRACT | Status: DC | PRN

## 2012-07-09 MED ORDER — SODIUM CHLORIDE 0.9 % IV SOLN: INTRAVENOUS | Status: DC

## 2012-07-09 MED ORDER — HEPARIN SODIUM (PORCINE) 1000 UNIT/ML IJ SOLN
INTRAMUSCULAR | Status: AC
  Filled 2012-07-09: qty 1

## 2012-07-09 MED ORDER — LIDOCAINE HCL (PF) 1 % IJ SOLN
INTRAMUSCULAR | Status: AC
  Filled 2012-07-09: qty 30

## 2012-07-09 MED ORDER — ADULT MULTIVITAMIN W/MINERALS CH: 1.0000 | ORAL_TABLET | Freq: Every day | ORAL | Status: DC

## 2012-07-09 MED ORDER — MIDAZOLAM HCL 2 MG/2ML IJ SOLN
INTRAMUSCULAR | Status: AC
  Filled 2012-07-09: qty 2

## 2012-07-09 MED ORDER — ASPIRIN EC 81 MG PO TBEC: 81.0000 mg | DELAYED_RELEASE_TABLET | Freq: Every day | ORAL | Status: DC

## 2012-07-09 MED ORDER — NITROGLYCERIN 0.4 MG SL SUBL: 0.4000 mg | SUBLINGUAL_TABLET | SUBLINGUAL | Status: DC | PRN

## 2012-07-09 MED ORDER — MOMETASONE FURO-FORMOTEROL FUM 100-5 MCG/ACT IN AERO: 2.0000 | INHALATION_SPRAY | Freq: Two times a day (BID) | RESPIRATORY_TRACT | Status: DC

## 2012-07-09 MED ORDER — CIPROFLOXACIN HCL 500 MG PO TABS: 500.0000 mg | ORAL_TABLET | Freq: Two times a day (BID) | ORAL | Status: DC

## 2012-07-09 MED ORDER — ESTRADIOL-NORETHINDRONE ACET 1-0.5 MG PO TABS: 1.0000 | ORAL_TABLET | Freq: Every day | ORAL | Status: DC

## 2012-07-09 MED ORDER — VITAMIN D 50 MCG (2000 UT) PO CAPS: 2000.0000 [IU] | ORAL_CAPSULE | Freq: Every day | ORAL | Status: DC

## 2012-07-09 MED ORDER — HEPARIN (PORCINE) IN NACL 2-0.9 UNIT/ML-% IJ SOLN
INTRAMUSCULAR | Status: AC
  Filled 2012-07-09: qty 2000

## 2012-07-09 MED ORDER — SODIUM CHLORIDE 0.9 % IJ SOLN: 3.0000 mL | INTRAMUSCULAR | Status: DC | PRN

## 2012-07-09 MED ORDER — ASPIRIN 325 MG PO TABS
325.0000 mg | ORAL_TABLET | ORAL | Status: DC
Start: 2012-07-09 — End: 2012-07-09

## 2012-07-09 MED ORDER — DICLOFENAC SODIUM 75 MG PO TBEC: 75.0000 mg | DELAYED_RELEASE_TABLET | Freq: Every day | ORAL | Status: DC

## 2012-07-09 MED ORDER — ONDANSETRON HCL 4 MG/2ML IJ SOLN: 4.0000 mg | Freq: Four times a day (QID) | INTRAMUSCULAR | Status: DC | PRN

## 2012-07-09 MED ORDER — VITAMIN B-12 5000 MCG SL SUBL: 1.0000 | SUBLINGUAL_TABLET | Freq: Every day | SUBLINGUAL | Status: DC

## 2012-07-09 MED ORDER — ACETAMINOPHEN 325 MG PO TABS: 650.0000 mg | ORAL_TABLET | ORAL | Status: DC | PRN

## 2012-07-09 MED ORDER — GABAPENTIN 300 MG PO CAPS: 300.0000 mg | ORAL_CAPSULE | Freq: Four times a day (QID) | ORAL | Status: DC

## 2012-07-09 MED ORDER — FENTANYL CITRATE 0.05 MG/ML IJ SOLN
INTRAMUSCULAR | Status: AC
  Filled 2012-07-09: qty 2

## 2012-07-09 MED ORDER — SENNOSIDES 8.6 MG PO TABS: 1.0000 | ORAL_TABLET | Freq: Every day | ORAL | Status: DC

## 2012-07-09 MED ORDER — ASPIRIN 81 MG PO CHEW: 324.0000 mg | CHEWABLE_TABLET | ORAL | Status: DC

## 2012-07-09 MED ORDER — ASPIRIN 81 MG PO CHEW
CHEWABLE_TABLET | ORAL | Status: AC
  Administered 2012-07-09: 243 mg via ORAL
  Filled 2012-07-09: qty 3

## 2012-07-09 NOTE — ED Notes (Signed)
Pt has had chest pressure for the past month and had a cardiac stress test which she failed.  She is scheduled to have a cardiac cath in a week.  Pt has had increasing chest pressure today with pain in jaw and some nausea.  Pt here for evaluation of this.

## 2012-07-09 NOTE — H&P (Addendum)
See the recently completed H&P.  The patient comes in today with complaints of chest tenderness that had been continuous. Her EKG is without significant abnormality. We have been planning to perform diagnostic catheterization. With very emergency room presentation we will proceed with coronary angiography to exclude CAD as the source of her symptoms.  Current suspicion for underlying coronary disease producing angina at rest. Other possibilities include noncardiac source including reflux, lung disease, musculoskeletal, versus other.

## 2012-07-09 NOTE — CV Procedure (Signed)
     Diagnostic Cardiac Catheterization Report  Christy Stephenson  72 y.o.  female May 06, 1941  Procedure Date: 07/09/2012 Referring Physician: Clent Ridges MD Primary Cardiologist: HWBSmith, III   PROCEDURE:  Left heart catheterization with selective coronary angiography, left ventriculogram.  INDICATIONS:  Right jaw discomfort, dyspnea, and abnormal nuclear study.  The risks, benefits, and details of the procedure were explained to the patient.  The patient verbalized understanding and wanted to proceed.  Informed written consent was obtained.  PROCEDURE TECHNIQUE:  After Xylocaine anesthesia a 5 French sheath was placed in the right radial artery with a single anterior needle wall stick.   A Versacore wire was then used to advance the multipurpose catheter to the subclavian artery. The Versacore would not course into the innominate artery. We then advanced a multipurpose catheter further in the subclavian and did a hand injection. This demonstrated occlusion of the innominate artery.  5000 units of heparin arterywas administered after placing the sheath of the right radial artery.  We then  went to the right femoral approach. After 1% local Xylocaine anesthesia a 5 French sheath was placed in the right femoral artery. Coronary angiography was done using a  5 [place] A2 MP  catheter.  Left ventriculography was done using a The same  catheter.    CONTRAST:  Total of  80 cc.  COMPLICATIONS:  None.    HEMODYNAMICS:  Aortic pressure was  137/67 mmHg ; LV pressure was  142/3 mmHg ; LVEDP  17 mmHg.  There was no gradient between the left ventricle and aorta.    ANGIOGRAPHIC DATA:   The left main coronary artery is  normal .  The left anterior descending artery is normal .  The left circumflex artery is  Normal .  The right coronary artery is  dominant and normal .  LEFT VENTRICULOGRAM:  Left ventricular angiogram was done in the 30 RAO projection and revealed normal left ventricular wall motion  and systolic function with an estimated ejection fraction of  60%.   IMPRESSIONS:  1. 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  2. Normal coronary arteries.  3. Normal left ventricular function with upper normal LVEDP.    RECOMMENDATION:  Aspirin, statin therapy, bilateral carotid Doppler study as an outpatient. Plan home later today.Marland Kitchen
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
## 2012-07-12 NOTE — Discharge Summary (Signed)
Patient ID: Christy Stephenson MRN: [id number] DOB/AGE: 07/15/1940 72 y.o.  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 Placed in obsevation and cath performed the same day. No significant coronary obstruction was found. 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  Upper extremity and carotid doppler will be done as OP.   Discharge Exam: Blood pressure 148/45, pulse 69, temperature 98.3 F (36.8 C), temperature source Oral, resp. rate 16, SpO2 98.00%.   No abnormality found on exam at discharge. The right radial is patent and the the right femoral cath sight unremarkable with no hematoma or local complication. Labs:   Lab Results  Component Value Date   WBC 6.9 07/09/2012   HGB 14.0 07/09/2012   HCT 41.0 07/09/2012   MCV 91.7 07/09/2012   PLT 220 07/09/2012    Recent Labs Lab 07/09/12 1106  NA 143  K 4.0  CL 107  CO2 27  BUN 15  CREATININE 0.86  CALCIUM 9.4  GLUCOSE 89   Lab Results  Component Value Date   TROPONINI <0.30 07/09/2012      Radiology: No new  EKG: normal  FOLLOW UP PLANS AND APPOINTMENTS    Medication List    TAKE these medications       albuterol 108 (90 BASE) MCG/ACT inhaler  Commonly known as:  PROVENTIL HFA;VENTOLIN HFA  Inhale 2 puffs into the lungs every 6 (six) hours as needed for wheezing or shortness of breath.     aspirin EC 81 MG tablet  Take 81 mg by mouth daily.     ciprofloxacin 500 MG tablet  Commonly known as:  CIPRO  Take 500 mg by mouth 2  (two) times daily. Starting 07/03/12 for 7 days. Pt on day 6     diclofenac 75 MG EC tablet  Commonly known as:  VOLTAREN  Take 75 mg by mouth daily.     estradiol-norethindrone 1-0.5 MG per tablet  Commonly known as:  ACTIVELLA  Take 1 tablet by mouth daily.     Fluticasone-Salmeterol 100-50 MCG/DOSE Aepb  Commonly known as:  ADVAIR  Inhale 1 puff into the lungs every 12 (twelve) hours.     gabapentin 300 MG capsule  Commonly known as:  NEURONTIN  Take 300 mg by mouth 4 (four) times daily.     multivitamin with minerals Tabs  Take 1 tablet by mouth daily.     omeprazole 20 MG capsule  Commonly known as:  PRILOSEC  Take 20 mg by mouth 2 (two) times daily.     senna 8.6 MG tablet  Commonly known as:  SENOKOT  Take 1 tablet by mouth daily.     VITAMIN B-1 PO  Take 1 tablet by mouth 4 (four) times daily.     Vitamin B-12 5000 MCG Subl  Place 1 tablet under the tongue daily.     Vitamin D 2000 UNITS Caps  Take 2,000 Units by mouth daily.         BRING ALL MEDICATIONS WITH YOU TO FOLLOW UP APPOINTMENTS  Time spent with patient to include physician time:  Signed: Veatrice Kells  W 07/12/2012, 12:43 PM

## 2012-07-17 ENCOUNTER — Other Ambulatory Visit (HOSPITAL_COMMUNITY): Payer: Self-pay | Admitting: Internal Medicine

## 2012-07-23 ENCOUNTER — Ambulatory Visit (HOSPITAL_COMMUNITY)
Admission: RE | Admit: 2012-07-23 | Discharge: 2012-07-23 | Disposition: A | Discharge status: Home / Self Care | Payer: Medicare Other | Source: Ambulatory Visit | Attending: Internal Medicine | Admitting: Internal Medicine

## 2012-07-23 DIAGNOSIS — J45909 Unspecified asthma, uncomplicated: Secondary | ICD-10-CM | POA: Insufficient documentation

## 2012-07-23 LAB — PULMONARY FUNCTION TEST

## 2012-07-23 MED ORDER — ALBUTEROL SULFATE (5 MG/ML) 0.5% IN NEBU
2.5000 mg | INHALATION_SOLUTION | Freq: Once | RESPIRATORY_TRACT | Status: AC
  Administered 2012-07-23: 2.5 mg via RESPIRATORY_TRACT

## 2012-08-04 DIAGNOSIS — J479 Bronchiectasis, uncomplicated: Secondary | ICD-10-CM | POA: Diagnosis not present

## 2012-08-25 DIAGNOSIS — I639 Cerebral infarction, unspecified: Secondary | ICD-10-CM

## 2012-08-25 HISTORY — DX: Cerebral infarction, unspecified: I63.9

## 2012-08-26 ENCOUNTER — Other Ambulatory Visit: Payer: Self-pay | Admitting: Internal Medicine

## 2012-08-26 DIAGNOSIS — M129 Arthropathy, unspecified: Secondary | ICD-10-CM | POA: Diagnosis not present

## 2012-08-26 DIAGNOSIS — I2510 Atherosclerotic heart disease of native coronary artery without angina pectoris: Secondary | ICD-10-CM | POA: Diagnosis not present

## 2012-08-26 DIAGNOSIS — E785 Hyperlipidemia, unspecified: Secondary | ICD-10-CM | POA: Diagnosis not present

## 2012-08-26 DIAGNOSIS — I6789 Other cerebrovascular disease: Secondary | ICD-10-CM | POA: Diagnosis not present

## 2012-08-26 DIAGNOSIS — J449 Chronic obstructive pulmonary disease, unspecified: Secondary | ICD-10-CM | POA: Diagnosis not present

## 2012-08-26 DIAGNOSIS — G459 Transient cerebral ischemic attack, unspecified: Secondary | ICD-10-CM | POA: Diagnosis not present

## 2012-08-26 DIAGNOSIS — Z823 Family history of stroke: Secondary | ICD-10-CM | POA: Diagnosis not present

## 2012-08-26 DIAGNOSIS — I6350 Cerebral infarction due to unspecified occlusion or stenosis of unspecified cerebral artery: Secondary | ICD-10-CM | POA: Diagnosis not present

## 2012-08-27 ENCOUNTER — Ambulatory Visit
Admission: RE | Admit: 2012-08-27 | Discharge: 2012-08-27 | Disposition: A | Discharge status: Home / Self Care | Payer: Medicare Other | Source: Ambulatory Visit | Attending: Internal Medicine | Admitting: Internal Medicine

## 2012-08-27 DIAGNOSIS — G459 Transient cerebral ischemic attack, unspecified: Secondary | ICD-10-CM

## 2012-08-27 DIAGNOSIS — G9389 Other specified disorders of brain: Secondary | ICD-10-CM | POA: Diagnosis not present

## 2012-09-15 ENCOUNTER — Ambulatory Visit (INDEPENDENT_AMBULATORY_CARE_PROVIDER_SITE_OTHER): Payer: Medicare Other | Admitting: Neurology

## 2012-09-15 ENCOUNTER — Encounter: Payer: Self-pay | Admitting: Neurology

## 2012-09-15 VITALS — BP 126/81 | HR 74 | Ht 66.0 in | Wt 233.0 lb

## 2012-09-15 DIAGNOSIS — G454 Transient global amnesia: Secondary | ICD-10-CM

## 2012-09-15 HISTORY — DX: Transient global amnesia: G45.4

## 2012-09-15 NOTE — Progress Notes (Signed)
Reason for visit: Amnesia  Christy Stephenson is a 72 y.o. female  History of present illness:  Christy Stephenson is a 22 year old right-handed white female with a history of an event that occurred approximately 3 weeks ago. The patient had just gone to bed, and then she began asking the same question again and again. The husband realized that there was something wrong with her, and he took her to the emergency room at [facility]. The patient underwent a CT scan of the head that was unremarkable, and she was observed for several hours. Within 2 hours, the episodes seem to resolve. The patient was increased from 81 mg to 325 mg daily of aspirin. The patient had just undergone a 2-D echocardiogram and a carotid Doppler study prior to this hospitalization, and she was told of these were unremarkable. The results of the studies are not available to me. The patient has since been set up for MRI evaluation of the brain that showed minimal small vessel disease, and MRA of the head that was unremarkable. The patient is sent to this office for further evaluation. During the 2 or three-hour event, the patient demonstrated no slurred speech, weakness of the extremities, gait instability, and she did not report a headache. The patient has never had similar events before or since.   Past Medical History  Diagnosis Date  . Asthma   . Arthritis     osteo  . Hypercholesteremia   . Shortness of breath   . Obstructive sleep apnea 07/09/2012  . Peripheral neuropathy   . GERD (gastroesophageal reflux disease)   . Asthma   . TIA (transient ischemic attack)   . 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 Obesity     Past Surgical History  Procedure Laterality Date  . Tubal ligation    . Knee surgery Right   . Dilation and curettage of uterus    . Tonsillectomy    . Cardiac catheterization  07/09/2012  . Breast cyst excision    . Cataracts Left 2013  . Finger surgery Right 1971    ring finger repair    Family  History  Problem Relation Age of Onset  . Diabetes Mellitus II Mother   . CVA Father   . Heart attack Father   . Lung cancer Father   . Tuberculosis Sister     bovine    Social history:  reports that she has never smoked. She has never used smokeless tobacco. She reports that  drinks alcohol. She reports that she does not use illicit drugs.  Medications:  Current Outpatient Prescriptions on File Prior to Visit  Medication Sig Dispense Refill  . aspirin EC 81 MG tablet Take 81 mg by mouth daily.      . Cholecalciferol (VITAMIN D) 2000 UNITS CAPS Take 2,000 Units by mouth daily.      . Cyanocobalamin (VITAMIN B-12) 5000 MCG SUBL Place 1 tablet under the tongue daily.      . diclofenac (VOLTAREN) 75 MG EC tablet Take 75 mg by mouth daily.      Marland Kitchen estradiol-norethindrone (ACTIVELLA) 1-0.5 MG per tablet Take 1 tablet by mouth daily.      Marland Kitchen gabapentin (NEURONTIN) 300 MG capsule Take 300 mg by mouth 4 (four) times daily.      . Multiple Vitamin (MULTIVITAMIN WITH MINERALS) TABS Take 1 tablet by mouth daily.      Marland Kitchen omeprazole (PRILOSEC) 20 MG capsule Take 20 mg by mouth 2 (two) times daily.      Marland Kitchen  senna (SENOKOT) 8.6 MG tablet Take 1 tablet by mouth daily.      . Thiamine HCl (VITAMIN B-1 PO) Take 1 tablet by mouth 4 (four) times daily.       No current facility-administered medications on file prior to visit.    Allergies:  Allergies  Allergen Reactions  . Actifed (Triprolidine-Pse) Palpitations  . Bupropion Hcl Itching and Rash    ROS:  Out of a complete 14 system review of symptoms, the patient complains only of the following symptoms, and all other reviewed systems are negative.  Weight gain  Short of breath Incontinence of bladder Headache, numbness Dizziness Decreased energy  Blood pressure 126/81, pulse 74, height 5\' 6"  (1.676 m), weight 233 lb (105.688 kg).  Physical Exam  General: The patient is alert and cooperative at the time of the examination. The patient is  moderately obese.   Head: Pupils are equal, round, and reactive to light. Discs are flat bilaterally.  Neck: The neck is supple, no carotid bruits are noted.  Respiratory: The respiratory examination is clear.  Cardiovascular: The cardiovascular examination reveals a regular rate and rhythm, no obvious murmurs or rubs are noted.  Skin: Extremities are without significant edema.  Neurologic Exam  Mental status:  Cranial nerves: Facial symmetry is present. There is good sensation of the face to pinprick and soft touch bilaterally. The strength of the facial muscles and the muscles to head turning and shoulder shrug are normal bilaterally. Speech is well enunciated, no aphasia or dysarthria is noted. Extraocular movements are full. Visual fields are full.  Motor: The motor testing reveals 5 over 5 strength of all 4 extremities. Good symmetric motor tone is noted throughout.  Sensory: Sensory testing is intact to pinprick, soft touch, vibration sensation, and position sense on all 4 extremities. No evidence of extinction is noted.  Coordination: Cerebellar testing reveals good finger-nose-finger and heel-to-shin bilaterally.  Gait and station: Gait is normal. Tandem gait is normal. Romberg is negative. No drift is seen.  Reflexes: Deep tendon reflexes are symmetric and normal bilaterally. The ankle jerk reflexes are well-maintained.  Toes are downgoing bilaterally.   Assessment/Plan:  One. Transient global amnesia  The patient appeared to have a brief episode of transient global amnesia. The patient had a normal sensorium, but she has no memory or recollection of the events that occurred for 2 or 3 hours. The MRI study of the brain was unremarkable. The patient will be set up for an EEG study, and she will remain on aspirin. The patient will followup through this office if needed.  Marlan Palau MD 09/15/2012 7:19 PM  [facility] [address] [place], [place] [address]  Phone [phone] Fax [phone]

## 2012-09-15 NOTE — Patient Instructions (Signed)
Transient Global Amnesia Your exam shows you may have a rare problem that causes temporary amnesia, an inability to remember what has happened in the past several hours or day. Transient global amnesia (TGA) means you cannot remember recent events, even though you may look and act normally. There are no physical problems in TGA; your vision, strength, coordination, and sensations are all normal. TGA occurs most often in older patients, and in patients with high blood pressure. The exact cause of TGA is not known, although it is thought to be due to vascular disease in your brain. There is usually a complete return to normal memory capacity after an episode is over. About 20-30% of patients with TGA will have more than one episode, and some studies show a slight increased risk for stroke. Although no special treatment is needed, taking up to one adult aspirin daily reduces the risk of having a stroke. You should consider taking aspirin daily if you are not allergic to it. Medical evaluation may require specialized scans to check for stroke or other brain problems, an EEG (brain wave test), or blood tests. Avoid alcohol or any sedating medicines until you are completely recovered. Call your doctor right away if your memory is not fully recovered after 24 hours, or if you have any other serious problems including:  Severe headache, nausea, vomiting, fever, or other symptoms of an infection.  Weakness, numbness, difficulty with movement, or incoordination.  Blurred or double vision, unusual sleepiness, seizures, or fainting. Document Released: 06/13/2004 Document Revised: 07/29/2011 Document Reviewed: 05/06/2005 [facility] Patient Information 2013 [place], [place].

## 2012-09-17 ENCOUNTER — Telehealth: Payer: Self-pay | Admitting: Neurology

## 2012-09-17 ENCOUNTER — Ambulatory Visit (INDEPENDENT_AMBULATORY_CARE_PROVIDER_SITE_OTHER): Payer: Medicare Other

## 2012-09-17 DIAGNOSIS — G454 Transient global amnesia: Secondary | ICD-10-CM

## 2012-09-17 NOTE — Procedures (Signed)
  History:  Christy Stephenson is a 72 year old patient with an episode of transient amnesia lasting 2 or 3 hours that occurred 3 weeks prior to this evaluation. The patient is being evaluated for this episode that is thought to represent transient global amnesia.  This is a routine EEG. No skull defects are noted. Medications include aspirin, vitamin B12, diclofenac, gabapentin, multivitamins, Prilosec, Senokot, and thiamine.  EEG classification: Normal awake and drowsy  Description of the recording: The background rhythms of this recording consists of a fairly well modulated medium amplitude alpha rhythm of 9 Hz that is reactive to eye opening and closure. As the record progresses, the patient appears to remain in the waking state throughout the recording. Photic stimulation was performed, resulting in a bilateral and symmetric photic driving response. Hyperventilation was also performed, resulting in a minimal buildup of the background rhythm activities without significant slowing seen. Toward the end of the recording, the patient enters the drowsy state with slight symmetric slowing seen. The patient never enters stage II sleep. At no time during the recording does there appear to be evidence of spike or spike wave discharges or evidence of focal slowing. EKG monitor shows no evidence of cardiac rhythm abnormalities with a heart rate of 78.  Impression: This is a normal EEG recording in the waking and drowsy state. No evidence of ictal or interictal discharges are seen.

## 2012-09-17 NOTE — Telephone Encounter (Signed)
I called patient. The EEG study was unremarkable. No further workup is indicated. The patient is to remain on aspirin.

## 2012-09-22 ENCOUNTER — Other Ambulatory Visit: Payer: Medicare Other

## 2012-09-22 ENCOUNTER — Encounter: Payer: Self-pay | Admitting: Internal Medicine

## 2012-09-22 ENCOUNTER — Ambulatory Visit (INDEPENDENT_AMBULATORY_CARE_PROVIDER_SITE_OTHER): Payer: Medicare Other | Admitting: Internal Medicine

## 2012-09-22 VITALS — BP 110/78 | HR 77 | Ht 64.5 in | Wt 233.2 lb

## 2012-09-22 DIAGNOSIS — J45909 Unspecified asthma, uncomplicated: Secondary | ICD-10-CM

## 2012-09-22 DIAGNOSIS — G4733 Obstructive sleep apnea (adult) (pediatric): Secondary | ICD-10-CM

## 2012-09-22 DIAGNOSIS — J984 Other disorders of lung: Secondary | ICD-10-CM

## 2012-09-22 DIAGNOSIS — R918 Other nonspecific abnormal finding of lung field: Secondary | ICD-10-CM

## 2012-09-22 DIAGNOSIS — J8410 Pulmonary fibrosis, unspecified: Secondary | ICD-10-CM

## 2012-09-22 MED ORDER — UMECLIDINIUM-VILANTEROL 62.5-25 MCG/INH IN AEPB: 1.0000 | INHALATION_SPRAY | Freq: Every day | RESPIRATORY_TRACT | Status: DC

## 2012-09-22 NOTE — Progress Notes (Signed)
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 Husband here. Has been aware of shortness of breath over the last 6 months with exertion. Dr. Halina Andreas Cardiology had ruled out cardiac dyspnea. He sent her to [facility] for CT. Her first husband and father smoked. Rare cough. Using Spiriva, Advair 250. Some help. History of pneumonia as a child and diagnosed with asthma but no seasonal rhinitis. She has a history of granulomas. Sister died of tuberculosis but her own PPD skin test was always negative. History of anemia. History of sleep apnea treated CPAP 12 [facility]. Former Engineer, civil (consulting). PFT [facility] 07/23/12- mild obstructive airways disease with response to bronchodilator. Normal lung volumes, normal diffusion. FVC 2.36/79%, FEV1 1.89/83%, FEV1/FVC 0.80, FEF 25-75% 1.31/69%. TLC 96%, DLCO 95%. CT angiogram chest chest [facility] 08/04/2012: Prior granulomatous disease. 5 mm none, supplied nodule right mid lung. Bilateral lower lobe bronchiectatic change and underlying emphysema. Hiatal hernia. Fatty liver.  Prior to Admission medications   Medication Sig Start Date End Date Taking? Authorizing Provider  aspirin 325 MG tablet Take 325 mg by mouth daily.   Yes Historical Provider, MD  Cholecalciferol (VITAMIN D) 2000 UNITS CAPS Take 2,000 Units by mouth daily.   Yes Historical Provider, MD  Cyanocobalamin (VITAMIN B-12) 5000 MCG SUBL Place 1 tablet under the tongue daily.   Yes Historical Provider, MD  diclofenac (VOLTAREN) 75 MG EC tablet Take 75 mg by mouth 2 (two) times daily.    Yes Historical Provider, MD  estradiol-norethindrone (ACTIVELLA) 1-0.5 MG per tablet Take 1 tablet by mouth daily.   Yes Historical Provider, MD  Fluticasone Propionate (FLONASE NA) Place into the nose. 2 puffs in each nostril as needed   Yes Historical Provider, MD   Fluticasone-Salmeterol (ADVAIR) 250-50 MCG/DOSE AEPB Inhale 1 puff into the lungs every 12 (twelve) hours.   Yes Historical Provider, MD  gabapentin (NEURONTIN) 300 MG capsule Take 300 mg by mouth 4 (four) times daily.   Yes Historical Provider, MD  loratadine (CLARITIN) 10 MG tablet Take 10 mg by mouth daily as needed.    Yes Historical Provider, MD  Magnesium 200 MG TABS Take 400 mg by mouth daily.   Yes Historical Provider, MD  Multiple Vitamin (MULTIVITAMIN WITH MINERALS) TABS Take 1 tablet by mouth daily.   Yes Historical Provider, MD  omeprazole (PRILOSEC) 20 MG capsule Take 20 mg by mouth daily.    Yes Historical Provider, MD  senna (SENOKOT) 8.6 MG tablet Take 1 tablet by mouth daily.   Yes Historical Provider, MD  Thiamine HCl (VITAMIN B-1 PO) Take 1 tablet by mouth 4 (four) times daily.   Yes Historical Provider, MD  Tiotropium Bromide Monohydrate (SPIRIVA HANDIHALER IN) Inhale into the lungs. daily   Yes Historical Provider, MD  Umeclidinium-Vilanterol (ANORO ELLIPTA) 62.5-25 MCG/INH AEPB Inhale 1 puff into the lungs daily. RINSE MOUTH AFTER USE 09/22/12   Waymon Budge, MD    Family History  Problem Relation Age of Onset  . Diabetes Mellitus II Mother   . CVA Father   . Heart attack Father   . Lung cancer Father   . Tuberculosis Sister     bovine   History  Substance Use Topics  . Smoking status: Never Smoker   . Smokeless tobacco: Never Used     Comment: Had tried when younger  . Alcohol Use: Yes     Comment:  one glass of wine 2 times yearly   ROS-see HPI Constitutional:   No-   weight loss, night sweats, fevers, chills, fatigue, lassitude. HEENT:   No-  headaches, difficulty swallowing, tooth/dental problems, sore throat,       No-  sneezing, itching, ear ache, nasal congestion, post nasal drip,  CV:  No-   chest pain, orthopnea, PND, swelling in lower extremities, anasarca,                                  dizziness, palpitations Resp: +  shortness of breath with  exertion or at rest.              No-   productive cough,  No non-productive cough,  No- coughing up of blood.              No-   change in color of mucus.  No- wheezing.   Skin: No-   rash or lesions. GI:  No-   heartburn, indigestion, abdominal pain, nausea, vomiting, diarrhea,                 change in bowel habits, loss of appetite GU: No-   dysuria, change in color of urine, no urgency or frequency.  No- flank pain. MS:  No-   joint pain or swelling.  No- decreased range of motion.  No- back pain. Neuro-     nothing unusual Psych:  No- change in mood or affect. No depression or anxiety.  No memory loss.  OBJ- Physical Exam General- Alert, Oriented, Affect-appropriate, Distress- none acute. overweight Skin- rash-none, lesions- none, excoriation- none Lymphadenopathy- none Head- atraumatic            Eyes- Gross vision intact, PERRLA, conjunctivae and secretions clear            Ears- Hearing, canals-normal            Nose- Clear, no-Septal dev, mucus, polyps, erosion, perforation             Throat- Mallampati III , mucosa clear , drainage- none, tonsils- atrophic Neck- flexible , trachea midline, no stridor , thyroid nl, carotid no bruit Chest - symmetrical excursion , unlabored           Heart/CV- RRR , no murmur , no gallop  , no rub, nl s1 s2                           - JVD- none , edema- none, stasis changes- none, varices- none           Lung- + few crackles left base, wheeze- none, cough- none , dullness-none, rub- none           Chest wall-  Abd- tender-no, distended-no, bowel sounds-present, HSM- no Br/ Gen/ Rectal- Not done, not indicated Extrem- cyanosis- none, clubbing, none, atrophy- none, strength- nl Neuro- grossly intact to observation

## 2012-09-22 NOTE — Patient Instructions (Addendum)
Order- lab- TB Quantiferon gold assay      Dx lung nodules  We will have you return in 6 months unles you need me sooner. At that time, we will schedule repeat CT chest at [facility] without contrast to look again at the lung nodules  Sample Anoro Ellipta    1 puff then rinse, one time daily      Try this intead of Advair and Spiriva. When the sample runs out, then go back to your Advair and Spiriva as before. See if you can tell any difference.  See if you can walk and exercise to build some stamina this summer to help with shortness of breath on exertion.

## 2012-09-24 LAB — QUANTIFERON TB GOLD ASSAY (BLOOD)
Mitogen value: 9.17 IU/mL
Quantiferon Nil Value: 0.02 IU/mL
Quantiferon Tb Ag Minus Nil Value: 0.02 IU/mL

## 2012-09-29 DIAGNOSIS — Z124 Encounter for screening for malignant neoplasm of cervix: Secondary | ICD-10-CM | POA: Diagnosis not present

## 2012-10-03 DIAGNOSIS — J8410 Pulmonary fibrosis, unspecified: Secondary | ICD-10-CM | POA: Insufficient documentation

## 2012-10-03 NOTE — Assessment & Plan Note (Signed)
Grown PPD skin test were always negative. We need to make sure nodules are stable. Plan- Quantiferon TB Gold Assay. Followup chest CT, noncontrast, in 6 months as per radiology, to be done at [facility] for comparison.

## 2012-10-03 NOTE — Assessment & Plan Note (Signed)
There is not major pulmonary abnormality demonstrated on PFT. This should not be enough to cause significant dyspnea on exertion unless she was having more bronchospasm earlier in the spring. Her CT scan indicated at least some emphysema. Plan-I encouraged an exercise program to build some stamina and loose some weight. Try sample Anoro inhaler.

## 2012-10-13 ENCOUNTER — Other Ambulatory Visit: Payer: Self-pay

## 2012-10-22 ENCOUNTER — Telehealth: Payer: Self-pay | Admitting: Internal Medicine

## 2012-10-22 MED ORDER — FLUTICASONE PROPIONATE 50 MCG/ACT NA SUSP: 2.0000 | Freq: Every day | NASAL | Status: DC

## 2012-10-22 MED ORDER — TIOTROPIUM BROMIDE MONOHYDRATE 18 MCG IN CAPS: 18.0000 ug | ORAL_CAPSULE | Freq: Every day | RESPIRATORY_TRACT | Status: DC

## 2012-10-22 MED ORDER — FLUTICASONE-SALMETEROL 250-50 MCG/DOSE IN AEPB: 1.0000 | INHALATION_SPRAY | Freq: Two times a day (BID) | RESPIRATORY_TRACT | Status: DC

## 2012-10-22 NOTE — Telephone Encounter (Signed)
I spoke with pt. She stated the anoro helped her "COPD" but did not help with her "asthma". She stated when she would use the inhaler she would have an "asthma cough". When asked for pt to better explain she just stated " you know I have both COPD and asthma". She is wanting to go back on advair and requesting RX for flonase. Please advise Dr. Maple Hudson thanks Last OV 09/22/12 Pending 03/25/13

## 2012-10-22 NOTE — Telephone Encounter (Signed)
Per CY- d/c Anoro; refill her previous Advair 1 puff BID with prn refill and refill Flonase.

## 2012-10-22 NOTE — Telephone Encounter (Signed)
Refill sent and pt aware. Annleigh Knueppel, CMA  

## 2012-12-07 ENCOUNTER — Telehealth: Payer: Self-pay | Admitting: Internal Medicine

## 2012-12-07 DIAGNOSIS — J479 Bronchiectasis, uncomplicated: Secondary | ICD-10-CM

## 2012-12-07 DIAGNOSIS — R042 Hemoptysis: Secondary | ICD-10-CM

## 2012-12-07 MED ORDER — AMOXICILLIN-POT CLAVULANATE 875-125 MG PO TABS: 1.0000 | ORAL_TABLET | Freq: Two times a day (BID) | ORAL | Status: DC

## 2012-12-07 NOTE — Telephone Encounter (Signed)
Pt returned call. Christy Stephenson °

## 2012-12-07 NOTE — Telephone Encounter (Signed)
Would like to hold off on repeat CT a little longer- don't think we would see much change yet.Instead would like to treat now for bronchitis seen at last CT. Order- augmentin 875 # 14, 1 twice daily            Lab- Sputum culture and sens for routine and AFB.    Dx bronchiectasis, hemoptysis

## 2012-12-07 NOTE — Telephone Encounter (Signed)
Called spoke with patient Pink-tinged sputum x1 episode last week and x3 the week before.  Asked pt for the amount per episode and she replied "oh gosh, hardly any.  Maybe an eighth of a teaspoon" Not mixed with any discolored mucus Pt denies any increased SOB, wheezing, chest tightness, f/c/s. She would like her CT and appt with CY moved up  Dr Maple Hudson please advise, thank you.

## 2012-12-07 NOTE — Telephone Encounter (Signed)
Per 5.6.14 ov w/ CY: Patient Instructions    Order- lab- TB Quantiferon gold assay Dx lung nodules  We will have you return in 6 months unles you need me sooner. At that time, we will schedule repeat CT chest at [facility] without contrast to look again at the lung nodules  Sample Anoro Ellipta 1 puff then rinse, one time daily Try this intead of Advair and Spiriva. When the sample runs out, then go back to your Advair and Spiriva as before. See if you can tell any difference.  See if you can walk and exercise to build some stamina this summer to help with shortness of breath on exertion.    LMOM TCB x1 for pt

## 2012-12-07 NOTE — Telephone Encounter (Signed)
Called spoke with patient, advised of CY's recs as stated below Pt okay with holding the CT and beginning the augmentin w/ sputum culture Rx sent to verified pharmacy Order placed for culture as CY dictated below Pt to come at her convenience to pick up a specimen cup to take home - lab is aware (spoke with Chip Boer) Nothing further needed at this time; will sign off.

## 2012-12-15 ENCOUNTER — Other Ambulatory Visit: Payer: Medicare Other

## 2013-02-15 DIAGNOSIS — J45909 Unspecified asthma, uncomplicated: Secondary | ICD-10-CM | POA: Diagnosis not present

## 2013-02-15 DIAGNOSIS — G459 Transient cerebral ischemic attack, unspecified: Secondary | ICD-10-CM | POA: Diagnosis not present

## 2013-02-15 DIAGNOSIS — G4733 Obstructive sleep apnea (adult) (pediatric): Secondary | ICD-10-CM | POA: Diagnosis not present

## 2013-02-15 DIAGNOSIS — K219 Gastro-esophageal reflux disease without esophagitis: Secondary | ICD-10-CM | POA: Diagnosis not present

## 2013-02-15 DIAGNOSIS — E785 Hyperlipidemia, unspecified: Secondary | ICD-10-CM | POA: Diagnosis not present

## 2013-02-15 DIAGNOSIS — I771 Stricture of artery: Secondary | ICD-10-CM | POA: Diagnosis not present

## 2013-02-15 DIAGNOSIS — G609 Hereditary and idiopathic neuropathy, unspecified: Secondary | ICD-10-CM | POA: Diagnosis not present

## 2013-02-25 DIAGNOSIS — R197 Diarrhea, unspecified: Secondary | ICD-10-CM | POA: Diagnosis not present

## 2013-02-25 DIAGNOSIS — R1011 Right upper quadrant pain: Secondary | ICD-10-CM | POA: Diagnosis not present

## 2013-03-10 DIAGNOSIS — R933 Abnormal findings on diagnostic imaging of other parts of digestive tract: Secondary | ICD-10-CM | POA: Diagnosis not present

## 2013-03-12 ENCOUNTER — Other Ambulatory Visit (HOSPITAL_COMMUNITY): Payer: Self-pay | Admitting: Gastroenterology

## 2013-03-12 DIAGNOSIS — K838 Other specified diseases of biliary tract: Secondary | ICD-10-CM

## 2013-03-15 ENCOUNTER — Ambulatory Visit (HOSPITAL_COMMUNITY)
Admission: RE | Admit: 2013-03-15 | Discharge: 2013-03-15 | Disposition: A | Discharge status: Home / Self Care | Payer: Medicare Other | Source: Ambulatory Visit | Attending: Gastroenterology | Admitting: Gastroenterology

## 2013-03-15 DIAGNOSIS — R1011 Right upper quadrant pain: Secondary | ICD-10-CM | POA: Diagnosis not present

## 2013-03-15 DIAGNOSIS — K838 Other specified diseases of biliary tract: Secondary | ICD-10-CM

## 2013-03-15 DIAGNOSIS — R109 Unspecified abdominal pain: Secondary | ICD-10-CM | POA: Diagnosis not present

## 2013-03-15 MED ORDER — SINCALIDE 5 MCG IJ SOLR
INTRAMUSCULAR | Status: AC
  Administered 2013-03-15: 4.73 ug
  Filled 2013-03-15: qty 5

## 2013-03-15 MED ORDER — TECHNETIUM TC 99M MEBROFENIN IV KIT
5.0000 | PACK | Freq: Once | INTRAVENOUS | Status: AC | PRN
  Administered 2013-03-15: 5 via INTRAVENOUS

## 2013-03-15 MED ORDER — STERILE WATER FOR INJECTION IJ SOLN
INTRAMUSCULAR | Status: AC
  Filled 2013-03-15: qty 10

## 2013-03-23 ENCOUNTER — Telehealth: Payer: Self-pay | Admitting: Internal Medicine

## 2013-03-23 DIAGNOSIS — R911 Solitary pulmonary nodule: Secondary | ICD-10-CM

## 2013-03-23 NOTE — Telephone Encounter (Signed)
Ok to order CT chest without contrast to be done at [facility] where she had the last one, for dx lung nodules We can discuss report at next ov here.

## 2013-03-23 NOTE — Telephone Encounter (Signed)
Pt is aware that order will be placed for CT. Nothing further is needed at this time.

## 2013-03-23 NOTE — Telephone Encounter (Signed)
Per last OV with CDY on 09/22/12:  Patient Instructions    Order- lab- TB Quantiferon gold assay Dx lung nodules  We will have you return in 6 months unles you need me sooner. At that time, we will schedule repeat CT chest at [facility] without contrast to look again at the lung nodules  Sample Anoro Ellipta 1 puff then rinse, one time daily Try this intead of Advair and Spiriva. When the sample runs out, then go back to your Advair and Spiriva as before. See if you can tell any difference.  See if you can walk and exercise to build some stamina this summer to help with shortness of breath on exertion.    ------  Dr. Maple Hudson, did you want pt to have CT Chest prior to her OV with you on 11/6 or was this going to be ordered during upcoming appt?  Pt is calling because she hasn't heard anything about having CT Chest scheduled yet.  Please advise.  Thank you.

## 2013-03-24 DIAGNOSIS — R911 Solitary pulmonary nodule: Secondary | ICD-10-CM | POA: Diagnosis not present

## 2013-03-25 ENCOUNTER — Encounter: Payer: Self-pay | Admitting: Internal Medicine

## 2013-03-25 ENCOUNTER — Ambulatory Visit (INDEPENDENT_AMBULATORY_CARE_PROVIDER_SITE_OTHER): Payer: Medicare Other | Admitting: Internal Medicine

## 2013-03-25 VITALS — BP 130/62 | HR 76 | Ht 64.5 in | Wt 212.8 lb

## 2013-03-25 DIAGNOSIS — J984 Other disorders of lung: Secondary | ICD-10-CM

## 2013-03-25 DIAGNOSIS — J45909 Unspecified asthma, uncomplicated: Secondary | ICD-10-CM | POA: Diagnosis not present

## 2013-03-25 DIAGNOSIS — J8410 Pulmonary fibrosis, unspecified: Secondary | ICD-10-CM

## 2013-03-25 DIAGNOSIS — J4520 Mild intermittent asthma, uncomplicated: Secondary | ICD-10-CM

## 2013-03-25 NOTE — Patient Instructions (Signed)
Flu vax- hi dose  We will be happy to see you again if needed. For now you can follow with your primary physician at [facility]

## 2013-03-25 NOTE — Progress Notes (Signed)
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 Husband here. Has been aware of shortness of breath over the last 6 months with exertion. Dr. Halina Andreas Cardiology had ruled out cardiac dyspnea. He sent her to [facility] for [facility]. Her first husband and father smoked. Rare cough. Using Spiriva, Advair 250. Some help. History of pneumonia as a child and diagnosed with asthma but no seasonal rhinitis. She has a history of granulomas. Sister died of tuberculosis but her own PPD skin test was always negative. History of anemia. History of sleep apnea treated CPAP 12 [facility]. Former Engineer, civil (consulting). PFT [facility] 07/23/12- mild obstructive airways disease with response to bronchodilator. Normal lung volumes, normal diffusion. FVC 2.36/79%, FEV1 1.89/83%, FEV1/FVC 0.80, FEF 25-75% 1.31/69%. TLC 96%, DLCO 95%. CT angiogram chest chest [facility] 08/04/2012: Prior granulomatous disease. 5 mm none, supplied nodule right mid lung. Bilateral lower lobe bronchiectatic change and underlying emphysema. Hiatal hernia. Fatty liver.  03/25/13- 71yoF never smoker, lung nodules, emphysema-based on Ct Chest 08-04-12; Mild obstructive/ asthma. Husband here. FOLLOWS FOR: Pt states she has been doing well since last visit; Denies any troubles with SOB, wheezing, cough, or congesiton. She feels well and denies fever, night sweats, significant cough or swollen glands. Quant AFB assay 10/15/12- NEG CT chest at [facility] 03/24/13- prior granulomatous disease. Stable right middle lobe nodule which likely represents a benign calcified granuloma  ROS-see HPI Constitutional:   No-   weight loss, night sweats, fevers, chills, fatigue, lassitude. HEENT:   No-  headaches, difficulty swallowing, tooth/dental problems, sore throat,       No-  sneezing, itching, ear ache, nasal congestion,  post nasal drip,  CV:  No-   chest pain, orthopnea, PND, swelling in lower extremities, anasarca, dizziness, palpitations Resp: +  shortness of breath with exertion or at rest.              No-   productive cough,  No non-productive cough,  No- coughing up of blood.              No-   change in color of mucus.  No- wheezing.   Skin: No-   rash or lesions. GI:  No-   heartburn, indigestion, abdominal pain, nausea, vomiting, GU: . MS:  No-   joint pain or swelling.   Neuro-     nothing unusual Psych:  No- change in mood or affect. No depression or anxiety.  No memory loss.  OBJ- Physical Exam General- Alert, Oriented, Affect-appropriate, Distress- none acute. overweight Skin- rash-none, lesions- none, excoriation- none Lymphadenopathy- none Head- atraumatic            Eyes- Gross vision intact, PERRLA, conjunctivae and secretions clear            Ears- Hearing, canals-normal            Nose- Clear, no-Septal dev, mucus, polyps, erosion, perforation             Throat- Mallampati III , mucosa clear , drainage- none, tonsils- atrophic Neck- flexible , trachea midline, no stridor , thyroid nl, carotid no bruit Chest - symmetrical excursion , unlabored           Heart/CV- RRR , no murmur , no gallop  , no rub, nl s1 s2                           -  JVD- none , edema- none, stasis changes- none, varices- none           Lung-  clear, wheeze- none, cough- none , dullness-none, rub- none           Chest wall-  Abd-  Br/ Gen/ Rectal- Not done, not indicated Extrem- cyanosis- none, clubbing, none, atrophy- none, strength- nl Neuro- grossly intact to observation

## 2013-04-09 NOTE — Assessment & Plan Note (Signed)
Mild obstructive airways disease which may be a very mild asthma pattern, given response to bronchodilator. Plan-flu shot. She can followup with her primary physician and we will see her again as needed

## 2013-04-09 NOTE — Assessment & Plan Note (Signed)
Quant AFB assay 10/15/12- NEG  CT chest at [facility] 03/24/13- prior granulomatous disease. Stable right middle lobe nodule which likely represents a benign calcified granuloma Nonspecific old granulomatous disease. This looks inactive now.

## 2013-04-13 DIAGNOSIS — R197 Diarrhea, unspecified: Secondary | ICD-10-CM | POA: Diagnosis not present

## 2013-04-13 DIAGNOSIS — R1011 Right upper quadrant pain: Secondary | ICD-10-CM | POA: Diagnosis not present

## 2013-04-21 ENCOUNTER — Encounter: Payer: Self-pay | Admitting: Internal Medicine

## 2013-04-23 DIAGNOSIS — K6389 Other specified diseases of intestine: Secondary | ICD-10-CM | POA: Diagnosis not present

## 2013-04-23 DIAGNOSIS — K5730 Diverticulosis of large intestine without perforation or abscess without bleeding: Secondary | ICD-10-CM | POA: Diagnosis not present

## 2013-04-23 DIAGNOSIS — R197 Diarrhea, unspecified: Secondary | ICD-10-CM | POA: Diagnosis not present

## 2013-05-16 DIAGNOSIS — J040 Acute laryngitis: Secondary | ICD-10-CM | POA: Diagnosis not present

## 2013-05-27 DIAGNOSIS — R1011 Right upper quadrant pain: Secondary | ICD-10-CM | POA: Diagnosis not present

## 2013-05-27 DIAGNOSIS — K449 Diaphragmatic hernia without obstruction or gangrene: Secondary | ICD-10-CM | POA: Diagnosis not present

## 2013-05-27 DIAGNOSIS — K319 Disease of stomach and duodenum, unspecified: Secondary | ICD-10-CM | POA: Diagnosis not present

## 2013-05-28 DIAGNOSIS — H2640 Unspecified secondary cataract: Secondary | ICD-10-CM | POA: Diagnosis not present

## 2013-05-31 ENCOUNTER — Ambulatory Visit
Admission: RE | Admit: 2013-05-31 | Discharge: 2013-05-31 | Disposition: A | Discharge status: Home / Self Care | Payer: Medicare Other | Source: Ambulatory Visit | Attending: Internal Medicine | Admitting: Internal Medicine

## 2013-05-31 ENCOUNTER — Other Ambulatory Visit: Payer: Self-pay | Admitting: Internal Medicine

## 2013-05-31 DIAGNOSIS — R05 Cough: Secondary | ICD-10-CM | POA: Diagnosis not present

## 2013-05-31 DIAGNOSIS — R490 Dysphonia: Secondary | ICD-10-CM | POA: Diagnosis not present

## 2013-05-31 DIAGNOSIS — J040 Acute laryngitis: Secondary | ICD-10-CM | POA: Diagnosis not present

## 2013-05-31 DIAGNOSIS — R059 Cough, unspecified: Secondary | ICD-10-CM | POA: Diagnosis not present

## 2013-06-25 ENCOUNTER — Ambulatory Visit
Admission: RE | Admit: 2013-06-25 | Discharge: 2013-06-25 | Disposition: A | Discharge status: Home / Self Care | Payer: Medicare Other | Source: Ambulatory Visit | Attending: Internal Medicine | Admitting: Internal Medicine

## 2013-06-25 ENCOUNTER — Other Ambulatory Visit: Payer: Self-pay | Admitting: Internal Medicine

## 2013-06-25 DIAGNOSIS — M4800 Spinal stenosis, site unspecified: Secondary | ICD-10-CM | POA: Diagnosis not present

## 2013-06-25 DIAGNOSIS — R1011 Right upper quadrant pain: Secondary | ICD-10-CM | POA: Diagnosis not present

## 2013-06-25 DIAGNOSIS — M5030 Other cervical disc degeneration, unspecified cervical region: Secondary | ICD-10-CM | POA: Diagnosis not present

## 2013-06-25 DIAGNOSIS — M542 Cervicalgia: Secondary | ICD-10-CM

## 2013-06-25 DIAGNOSIS — M4120 Other idiopathic scoliosis, site unspecified: Secondary | ICD-10-CM | POA: Diagnosis not present

## 2013-06-25 DIAGNOSIS — M4802 Spinal stenosis, cervical region: Secondary | ICD-10-CM | POA: Diagnosis not present

## 2013-06-25 DIAGNOSIS — M4310 Spondylolisthesis, site unspecified: Secondary | ICD-10-CM | POA: Diagnosis not present

## 2013-06-25 DIAGNOSIS — M549 Dorsalgia, unspecified: Secondary | ICD-10-CM | POA: Diagnosis not present

## 2013-06-25 DIAGNOSIS — M5137 Other intervertebral disc degeneration, lumbosacral region: Secondary | ICD-10-CM | POA: Diagnosis not present

## 2013-06-25 DIAGNOSIS — M47812 Spondylosis without myelopathy or radiculopathy, cervical region: Secondary | ICD-10-CM | POA: Diagnosis not present

## 2013-06-25 DIAGNOSIS — R197 Diarrhea, unspecified: Secondary | ICD-10-CM | POA: Diagnosis not present

## 2013-06-25 DIAGNOSIS — R413 Other amnesia: Secondary | ICD-10-CM | POA: Diagnosis not present

## 2013-06-30 ENCOUNTER — Other Ambulatory Visit: Payer: Medicare Other

## 2013-07-02 ENCOUNTER — Other Ambulatory Visit: Payer: Medicare Other

## 2013-07-26 ENCOUNTER — Encounter: Payer: Self-pay | Admitting: Physical Medicine & Rehabilitation

## 2013-08-16 DIAGNOSIS — J45909 Unspecified asthma, uncomplicated: Secondary | ICD-10-CM | POA: Diagnosis not present

## 2013-08-16 DIAGNOSIS — M549 Dorsalgia, unspecified: Secondary | ICD-10-CM | POA: Diagnosis not present

## 2013-08-16 DIAGNOSIS — K219 Gastro-esophageal reflux disease without esophagitis: Secondary | ICD-10-CM | POA: Diagnosis not present

## 2013-08-16 DIAGNOSIS — Z87448 Personal history of other diseases of urinary system: Secondary | ICD-10-CM | POA: Diagnosis not present

## 2013-08-31 DIAGNOSIS — M47817 Spondylosis without myelopathy or radiculopathy, lumbosacral region: Secondary | ICD-10-CM | POA: Diagnosis not present

## 2013-08-31 DIAGNOSIS — M47812 Spondylosis without myelopathy or radiculopathy, cervical region: Secondary | ICD-10-CM | POA: Diagnosis not present

## 2013-09-02 ENCOUNTER — Other Ambulatory Visit: Payer: Self-pay | Admitting: Neurosurgery

## 2013-09-02 DIAGNOSIS — M47817 Spondylosis without myelopathy or radiculopathy, lumbosacral region: Secondary | ICD-10-CM

## 2013-09-09 ENCOUNTER — Ambulatory Visit
Admission: RE | Admit: 2013-09-09 | Discharge: 2013-09-09 | Disposition: A | Discharge status: Home / Self Care | Payer: Medicare Other | Source: Ambulatory Visit | Attending: Neurosurgery | Admitting: Neurosurgery

## 2013-09-09 DIAGNOSIS — M47817 Spondylosis without myelopathy or radiculopathy, lumbosacral region: Secondary | ICD-10-CM

## 2013-09-09 DIAGNOSIS — M5137 Other intervertebral disc degeneration, lumbosacral region: Secondary | ICD-10-CM | POA: Diagnosis not present

## 2013-09-09 DIAGNOSIS — M4120 Other idiopathic scoliosis, site unspecified: Secondary | ICD-10-CM | POA: Diagnosis not present

## 2013-09-09 DIAGNOSIS — M48061 Spinal stenosis, lumbar region without neurogenic claudication: Secondary | ICD-10-CM | POA: Diagnosis not present

## 2013-09-21 ENCOUNTER — Ambulatory Visit: Payer: Medicare Other | Admitting: Physical Medicine & Rehabilitation

## 2013-09-28 DIAGNOSIS — M47817 Spondylosis without myelopathy or radiculopathy, lumbosacral region: Secondary | ICD-10-CM | POA: Diagnosis not present

## 2013-09-28 DIAGNOSIS — M47812 Spondylosis without myelopathy or radiculopathy, cervical region: Secondary | ICD-10-CM | POA: Diagnosis not present

## 2013-11-04 ENCOUNTER — Other Ambulatory Visit: Payer: Self-pay | Admitting: Internal Medicine

## 2013-11-15 DIAGNOSIS — D485 Neoplasm of uncertain behavior of skin: Secondary | ICD-10-CM | POA: Diagnosis not present

## 2013-11-15 DIAGNOSIS — L820 Inflamed seborrheic keratosis: Secondary | ICD-10-CM | POA: Diagnosis not present

## 2013-11-25 ENCOUNTER — Other Ambulatory Visit: Payer: Self-pay | Admitting: Internal Medicine

## 2013-12-02 ENCOUNTER — Other Ambulatory Visit: Payer: Self-pay | Admitting: Internal Medicine

## 2013-12-02 DIAGNOSIS — Z01810 Encounter for preprocedural cardiovascular examination: Secondary | ICD-10-CM | POA: Diagnosis not present

## 2013-12-02 DIAGNOSIS — M47817 Spondylosis without myelopathy or radiculopathy, lumbosacral region: Secondary | ICD-10-CM | POA: Diagnosis not present

## 2013-12-02 DIAGNOSIS — J40 Bronchitis, not specified as acute or chronic: Secondary | ICD-10-CM | POA: Diagnosis not present

## 2013-12-02 DIAGNOSIS — Z01818 Encounter for other preprocedural examination: Secondary | ICD-10-CM | POA: Diagnosis not present

## 2013-12-03 DIAGNOSIS — Z136 Encounter for screening for cardiovascular disorders: Secondary | ICD-10-CM | POA: Diagnosis not present

## 2013-12-03 NOTE — Telephone Encounter (Signed)
Pt last seen 03-25-13 with check out instructions as follows:  Patient Instructions     Flu vax- hi dose  We will be happy to see you again if needed. For now you can follow with your primary physician at [facility], Please advise if you want to continue filling Advair for patient or have her get through her PCP; pt does not have f/u appt scheduled. Thanks.

## 2013-12-03 NOTE — Telephone Encounter (Signed)
Ok to refill 

## 2013-12-07 DIAGNOSIS — M47817 Spondylosis without myelopathy or radiculopathy, lumbosacral region: Secondary | ICD-10-CM | POA: Diagnosis not present

## 2013-12-07 DIAGNOSIS — I951 Orthostatic hypotension: Secondary | ICD-10-CM | POA: Diagnosis not present

## 2013-12-07 DIAGNOSIS — Z79899 Other long term (current) drug therapy: Secondary | ICD-10-CM | POA: Diagnosis not present

## 2013-12-07 DIAGNOSIS — M439 Deforming dorsopathy, unspecified: Secondary | ICD-10-CM | POA: Diagnosis not present

## 2013-12-07 DIAGNOSIS — M539 Dorsopathy, unspecified: Secondary | ICD-10-CM | POA: Diagnosis not present

## 2013-12-07 DIAGNOSIS — E7800 Pure hypercholesterolemia, unspecified: Secondary | ICD-10-CM | POA: Diagnosis not present

## 2013-12-07 DIAGNOSIS — J449 Chronic obstructive pulmonary disease, unspecified: Secondary | ICD-10-CM | POA: Diagnosis not present

## 2013-12-07 DIAGNOSIS — G4730 Sleep apnea, unspecified: Secondary | ICD-10-CM | POA: Diagnosis not present

## 2013-12-07 DIAGNOSIS — E669 Obesity, unspecified: Secondary | ICD-10-CM | POA: Diagnosis not present

## 2013-12-07 DIAGNOSIS — Z888 Allergy status to other drugs, medicaments and biological substances status: Secondary | ICD-10-CM | POA: Diagnosis not present

## 2013-12-07 DIAGNOSIS — Z6834 Body mass index (BMI) 34.0-34.9, adult: Secondary | ICD-10-CM | POA: Diagnosis not present

## 2013-12-07 DIAGNOSIS — J4489 Other specified chronic obstructive pulmonary disease: Secondary | ICD-10-CM | POA: Diagnosis not present

## 2013-12-07 DIAGNOSIS — IMO0002 Reserved for concepts with insufficient information to code with codable children: Secondary | ICD-10-CM | POA: Diagnosis not present

## 2013-12-15 DIAGNOSIS — M47817 Spondylosis without myelopathy or radiculopathy, lumbosacral region: Secondary | ICD-10-CM | POA: Diagnosis not present

## 2013-12-15 DIAGNOSIS — M4120 Other idiopathic scoliosis, site unspecified: Secondary | ICD-10-CM | POA: Diagnosis not present

## 2013-12-15 DIAGNOSIS — Z9889 Other specified postprocedural states: Secondary | ICD-10-CM | POA: Diagnosis not present

## 2014-01-11 ENCOUNTER — Other Ambulatory Visit: Payer: Self-pay | Admitting: Internal Medicine

## 2014-02-17 DIAGNOSIS — Z23 Encounter for immunization: Secondary | ICD-10-CM | POA: Diagnosis not present

## 2014-02-17 DIAGNOSIS — G609 Hereditary and idiopathic neuropathy, unspecified: Secondary | ICD-10-CM | POA: Diagnosis not present

## 2014-02-17 DIAGNOSIS — Z1389 Encounter for screening for other disorder: Secondary | ICD-10-CM | POA: Diagnosis not present

## 2014-02-17 DIAGNOSIS — Z0000 Encounter for general adult medical examination without abnormal findings: Secondary | ICD-10-CM | POA: Diagnosis not present

## 2014-02-17 DIAGNOSIS — G4733 Obstructive sleep apnea (adult) (pediatric): Secondary | ICD-10-CM | POA: Diagnosis not present

## 2014-02-17 DIAGNOSIS — J45909 Unspecified asthma, uncomplicated: Secondary | ICD-10-CM | POA: Diagnosis not present

## 2014-02-17 DIAGNOSIS — M8580 Other specified disorders of bone density and structure, unspecified site: Secondary | ICD-10-CM | POA: Diagnosis not present

## 2014-02-17 DIAGNOSIS — E785 Hyperlipidemia, unspecified: Secondary | ICD-10-CM | POA: Diagnosis not present

## 2014-02-21 DIAGNOSIS — M8580 Other specified disorders of bone density and structure, unspecified site: Secondary | ICD-10-CM | POA: Diagnosis not present

## 2014-02-21 DIAGNOSIS — M899 Disorder of bone, unspecified: Secondary | ICD-10-CM | POA: Diagnosis not present

## 2014-03-24 ENCOUNTER — Other Ambulatory Visit: Payer: Self-pay | Admitting: Internal Medicine

## 2014-03-29 ENCOUNTER — Other Ambulatory Visit: Payer: Self-pay | Admitting: Internal Medicine

## 2014-04-05 ENCOUNTER — Telehealth: Payer: Self-pay | Admitting: Internal Medicine

## 2014-04-05 MED ORDER — TIOTROPIUM BROMIDE MONOHYDRATE 18 MCG IN CAPS: ORAL_CAPSULE | RESPIRATORY_TRACT | Status: DC

## 2014-04-05 NOTE — Telephone Encounter (Signed)
lmtcb for pt. Pt last saw CY on 03/25/2013. Pt will need OV with CY.

## 2014-04-05 NOTE — Telephone Encounter (Signed)
Spiriva refill sent to [facility] per pt request- #30 x 1 refill Pt scheduled for OV 05/16/14 at 4pm with CY Nothing further needed.

## 2014-04-07 DIAGNOSIS — M4302 Spondylolysis, cervical region: Secondary | ICD-10-CM | POA: Diagnosis not present

## 2014-04-07 DIAGNOSIS — M47816 Spondylosis without myelopathy or radiculopathy, lumbar region: Secondary | ICD-10-CM | POA: Diagnosis not present

## 2014-04-25 ENCOUNTER — Other Ambulatory Visit: Payer: Self-pay | Admitting: Internal Medicine

## 2014-04-28 ENCOUNTER — Encounter (HOSPITAL_COMMUNITY): Payer: Self-pay | Admitting: Interventional Cardiology

## 2014-05-16 ENCOUNTER — Encounter: Payer: Self-pay | Admitting: Internal Medicine

## 2014-05-16 ENCOUNTER — Ambulatory Visit (INDEPENDENT_AMBULATORY_CARE_PROVIDER_SITE_OTHER): Payer: Medicare Other | Admitting: Internal Medicine

## 2014-05-16 VITALS — BP 124/70 | HR 82 | Ht 64.5 in | Wt 212.4 lb

## 2014-05-16 DIAGNOSIS — J4520 Mild intermittent asthma, uncomplicated: Secondary | ICD-10-CM | POA: Diagnosis not present

## 2014-05-16 DIAGNOSIS — G4733 Obstructive sleep apnea (adult) (pediatric): Secondary | ICD-10-CM | POA: Diagnosis not present

## 2014-05-16 DIAGNOSIS — J8410 Pulmonary fibrosis, unspecified: Secondary | ICD-10-CM

## 2014-05-16 DIAGNOSIS — J984 Other disorders of lung: Secondary | ICD-10-CM

## 2014-05-16 MED ORDER — FLUTICASONE-SALMETEROL 250-50 MCG/DOSE IN AEPB: INHALATION_SPRAY | RESPIRATORY_TRACT | Status: DC

## 2014-05-16 MED ORDER — TIOTROPIUM BROMIDE MONOHYDRATE 18 MCG IN CAPS: ORAL_CAPSULE | RESPIRATORY_TRACT | Status: DC

## 2014-05-16 NOTE — Assessment & Plan Note (Signed)
This is looking stable and benign. Discussed occasional CXR.

## 2014-05-16 NOTE — Assessment & Plan Note (Signed)
Asthma mild intermittent. Plan- drop off Spiriva and reduce Advair to 1 puff daily as we watch stability with meds revealed. Resume previous schedule if needed.

## 2014-05-16 NOTE — Patient Instructions (Addendum)
Try reducing Advair to 1 x daily  Try staying off the Spiriva, restarting when you feel you need it.  Refill scripts sent  Please call if we can help

## 2014-05-16 NOTE — Assessment & Plan Note (Signed)
Managed by her PCP. She describes good compliance and control.

## 2014-05-16 NOTE — Progress Notes (Signed)
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 Husband here. Has been aware of shortness of breath over the last 6 months with exertion. Dr. Cecille Aver Cardiology had ruled out cardiac dyspnea. He sent her to [facility] for [place]. Her first husband and father smoked. Rare cough. Using Spiriva, Advair 250. Some help. History of pneumonia as a child and diagnosed with asthma but no seasonal rhinitis. She has a history of granulomas. Sister died of tuberculosis but her own PPD skin test was always negative. History of anemia. History of sleep apnea treated CPAP 12 [facility]. Former Marine scientist. PFT [facility] 07/23/12- mild obstructive airways disease with response to bronchodilator. Normal lung volumes, normal diffusion. FVC 2.36/79%, FEV1 1.89/83%, FEV1/FVC 0.80, FEF 25-75% 1.31/69%. TLC 96%, DLCO 95%. CT angiogram chest chest [facility] 08/04/2012: Prior granulomatous disease. 5 mm none, supplied nodule right mid lung. Bilateral lower lobe bronchiectatic change and underlying emphysema. Hiatal hernia. Fatty liver.  03/25/13- 71yoF never smoker, lung nodules, emphysema-based on Ct Chest 08-04-12; Mild obstructive/ asthma. Husband here. FOLLOWS FOR: Pt states she has been doing well since last visit; Denies any troubles with SOB, wheezing, cough, or congesiton. She feels well and denies fever, night sweats, significant cough or swollen glands. Quant AFB assay 10/15/12- NEG CT chest at [facility] 03/24/13- prior granulomatous disease. Stable right middle lobe nodule which likely represents a benign calcified granuloma  05/16/14-71yoF never smoker, lung nodules/ granulomas, emphysema-based on Ct Chest 08-04-12; Mild obstructive/ asthma, complicated by polyneuropathy, hx OSA   Husband here FOLLOWS FOR: Denies any SOB, wheezing, cough, or congestion. OSA-CPAP 12/  [facility] Pt managed by PCP Lumbar spine sgy uncomplicated this summer. Asthma stable on Advair and Spiriva. Never rescue inhaler or sleep disturbance. CXR 05/31/13 IMPRESSION: No interval change or acute cardiopulmonary disease. Old granulomatous disease, pulmonary parenchymal scarring and old left rib fractures. Electronically Signed  By: Dereck Ligas M.D.  On: 05/31/2013 17:12  ROS-see HPI Constitutional:   No-   weight loss, night sweats, fevers, chills, fatigue, lassitude. HEENT:   No-  headaches, difficulty swallowing, tooth/dental problems, sore throat,       No-  sneezing, itching, ear ache, nasal congestion, post nasal drip,  CV:  No-   chest pain, orthopnea, PND, swelling in lower extremities, anasarca, dizziness, palpitations Resp: +  shortness of breath with exertion or at rest.              No-   productive cough,  No non-productive cough,  No- coughing up of blood.              No-   change in color of mucus.  No- wheezing.   Skin: No-   rash or lesions. GI:  No-   heartburn, indigestion, abdominal pain, nausea, vomiting, GU: . MS:  No-   joint pain or swelling.   Neuro-     nothing unusual Psych:  No- change in mood or affect. No depression or anxiety.  No memory loss.  OBJ- Physical Exam General- Alert, Oriented, Affect-appropriate, Distress- none acute. overweight Skin- rash-none, lesions- none, excoriation- none Lymphadenopathy- none Head- atraumatic            Eyes- Gross vision intact, PERRLA, conjunctivae and secretions clear            Ears- Hearing, canals-normal            Nose- Clear, no-Septal  dev, mucus, polyps, erosion, perforation             Throat- Mallampati III , mucosa clear , drainage- none, tonsils- atrophic Neck- flexible , trachea midline, no stridor , thyroid nl, carotid no bruit Chest - symmetrical excursion , unlabored           Heart/CV- RRR , no murmur , no gallop  , no rub, nl s1 s2                           - JVD+1 cm , edema-  none, stasis changes- none, varices- none           Lung-  clear, wheeze- none, cough- none , dullness-none, rub- none           Chest wall-  Abd-  Br/ Gen/ Rectal- Not done, not indicated Extrem- cyanosis- none, clubbing, none, atrophy- none, strength- nl Neuro- grossly intact to observation

## 2014-08-19 DIAGNOSIS — M4302 Spondylolysis, cervical region: Secondary | ICD-10-CM | POA: Diagnosis not present

## 2014-08-19 DIAGNOSIS — M47816 Spondylosis without myelopathy or radiculopathy, lumbar region: Secondary | ICD-10-CM | POA: Diagnosis not present

## 2014-09-19 DIAGNOSIS — Z1231 Encounter for screening mammogram for malignant neoplasm of breast: Secondary | ICD-10-CM | POA: Diagnosis not present

## 2014-09-19 DIAGNOSIS — Z01419 Encounter for gynecological examination (general) (routine) without abnormal findings: Secondary | ICD-10-CM | POA: Diagnosis not present

## 2014-09-22 ENCOUNTER — Other Ambulatory Visit: Payer: Self-pay | Admitting: Obstetrics and Gynecology

## 2014-09-22 DIAGNOSIS — R928 Other abnormal and inconclusive findings on diagnostic imaging of breast: Secondary | ICD-10-CM

## 2014-09-29 ENCOUNTER — Ambulatory Visit
Admission: RE | Admit: 2014-09-29 | Discharge: 2014-09-29 | Disposition: A | Discharge status: Home / Self Care | Payer: Medicare Other | Source: Ambulatory Visit | Attending: Obstetrics and Gynecology | Admitting: Obstetrics and Gynecology

## 2014-09-29 DIAGNOSIS — R928 Other abnormal and inconclusive findings on diagnostic imaging of breast: Secondary | ICD-10-CM | POA: Diagnosis not present

## 2014-10-26 DIAGNOSIS — H43819 Vitreous degeneration, unspecified eye: Secondary | ICD-10-CM | POA: Diagnosis not present

## 2014-10-26 DIAGNOSIS — H5712 Ocular pain, left eye: Secondary | ICD-10-CM | POA: Diagnosis not present

## 2014-10-26 DIAGNOSIS — H2640 Unspecified secondary cataract: Secondary | ICD-10-CM | POA: Diagnosis not present

## 2014-11-22 DIAGNOSIS — M47816 Spondylosis without myelopathy or radiculopathy, lumbar region: Secondary | ICD-10-CM | POA: Diagnosis not present

## 2014-12-26 DIAGNOSIS — M1711 Unilateral primary osteoarthritis, right knee: Secondary | ICD-10-CM | POA: Diagnosis not present

## 2015-02-21 DIAGNOSIS — Z0001 Encounter for general adult medical examination with abnormal findings: Secondary | ICD-10-CM | POA: Diagnosis not present

## 2015-02-21 DIAGNOSIS — G609 Hereditary and idiopathic neuropathy, unspecified: Secondary | ICD-10-CM | POA: Diagnosis not present

## 2015-02-21 DIAGNOSIS — Z79899 Other long term (current) drug therapy: Secondary | ICD-10-CM | POA: Diagnosis not present

## 2015-02-21 DIAGNOSIS — E785 Hyperlipidemia, unspecified: Secondary | ICD-10-CM | POA: Diagnosis not present

## 2015-02-21 DIAGNOSIS — K219 Gastro-esophageal reflux disease without esophagitis: Secondary | ICD-10-CM | POA: Diagnosis not present

## 2015-02-21 DIAGNOSIS — G4733 Obstructive sleep apnea (adult) (pediatric): Secondary | ICD-10-CM | POA: Diagnosis not present

## 2015-02-21 DIAGNOSIS — Z23 Encounter for immunization: Secondary | ICD-10-CM | POA: Diagnosis not present

## 2015-02-21 DIAGNOSIS — Z1389 Encounter for screening for other disorder: Secondary | ICD-10-CM | POA: Diagnosis not present

## 2015-02-21 DIAGNOSIS — J4520 Mild intermittent asthma, uncomplicated: Secondary | ICD-10-CM | POA: Diagnosis not present

## 2015-03-22 DIAGNOSIS — M47816 Spondylosis without myelopathy or radiculopathy, lumbar region: Secondary | ICD-10-CM | POA: Diagnosis not present

## 2015-03-22 DIAGNOSIS — M4302 Spondylolysis, cervical region: Secondary | ICD-10-CM | POA: Diagnosis not present

## 2015-04-03 DIAGNOSIS — G4733 Obstructive sleep apnea (adult) (pediatric): Secondary | ICD-10-CM | POA: Diagnosis not present

## 2015-04-25 DIAGNOSIS — G609 Hereditary and idiopathic neuropathy, unspecified: Secondary | ICD-10-CM | POA: Diagnosis not present

## 2015-04-25 DIAGNOSIS — M1711 Unilateral primary osteoarthritis, right knee: Secondary | ICD-10-CM | POA: Diagnosis not present

## 2015-04-25 DIAGNOSIS — M47817 Spondylosis without myelopathy or radiculopathy, lumbosacral region: Secondary | ICD-10-CM | POA: Diagnosis not present

## 2015-04-25 DIAGNOSIS — M25561 Pain in right knee: Secondary | ICD-10-CM | POA: Diagnosis not present

## 2015-05-25 ENCOUNTER — Other Ambulatory Visit: Payer: Self-pay | Admitting: Internal Medicine

## 2015-05-25 DIAGNOSIS — Z888 Allergy status to other drugs, medicaments and biological substances status: Secondary | ICD-10-CM | POA: Diagnosis not present

## 2015-05-25 DIAGNOSIS — J45909 Unspecified asthma, uncomplicated: Secondary | ICD-10-CM | POA: Diagnosis not present

## 2015-05-25 DIAGNOSIS — Z7982 Long term (current) use of aspirin: Secondary | ICD-10-CM | POA: Diagnosis not present

## 2015-05-25 DIAGNOSIS — Z7951 Long term (current) use of inhaled steroids: Secondary | ICD-10-CM | POA: Diagnosis not present

## 2015-05-25 DIAGNOSIS — Z8249 Family history of ischemic heart disease and other diseases of the circulatory system: Secondary | ICD-10-CM | POA: Diagnosis not present

## 2015-05-25 DIAGNOSIS — Z833 Family history of diabetes mellitus: Secondary | ICD-10-CM | POA: Diagnosis not present

## 2015-05-25 DIAGNOSIS — M2241 Chondromalacia patellae, right knee: Secondary | ICD-10-CM | POA: Diagnosis not present

## 2015-05-25 DIAGNOSIS — Z823 Family history of stroke: Secondary | ICD-10-CM | POA: Diagnosis not present

## 2015-05-25 DIAGNOSIS — M1711 Unilateral primary osteoarthritis, right knee: Secondary | ICD-10-CM | POA: Diagnosis not present

## 2015-05-25 DIAGNOSIS — Z79899 Other long term (current) drug therapy: Secondary | ICD-10-CM | POA: Diagnosis not present

## 2015-05-25 DIAGNOSIS — M25561 Pain in right knee: Secondary | ICD-10-CM | POA: Diagnosis not present

## 2015-05-25 DIAGNOSIS — Z809 Family history of malignant neoplasm, unspecified: Secondary | ICD-10-CM | POA: Diagnosis not present

## 2015-06-19 DIAGNOSIS — Z79899 Other long term (current) drug therapy: Secondary | ICD-10-CM | POA: Diagnosis not present

## 2015-06-19 DIAGNOSIS — M1711 Unilateral primary osteoarthritis, right knee: Secondary | ICD-10-CM | POA: Diagnosis not present

## 2015-06-19 DIAGNOSIS — M2241 Chondromalacia patellae, right knee: Secondary | ICD-10-CM | POA: Diagnosis not present

## 2015-06-19 DIAGNOSIS — Z7982 Long term (current) use of aspirin: Secondary | ICD-10-CM | POA: Diagnosis not present

## 2015-06-19 DIAGNOSIS — Z7951 Long term (current) use of inhaled steroids: Secondary | ICD-10-CM | POA: Diagnosis not present

## 2015-06-19 DIAGNOSIS — M25561 Pain in right knee: Secondary | ICD-10-CM | POA: Diagnosis not present

## 2015-07-25 DIAGNOSIS — Z79891 Long term (current) use of opiate analgesic: Secondary | ICD-10-CM | POA: Diagnosis not present

## 2015-07-25 DIAGNOSIS — M25561 Pain in right knee: Secondary | ICD-10-CM | POA: Diagnosis not present

## 2015-08-23 DIAGNOSIS — M4302 Spondylolysis, cervical region: Secondary | ICD-10-CM | POA: Diagnosis not present

## 2015-08-23 DIAGNOSIS — M47816 Spondylosis without myelopathy or radiculopathy, lumbar region: Secondary | ICD-10-CM | POA: Diagnosis not present

## 2015-09-25 DIAGNOSIS — L820 Inflamed seborrheic keratosis: Secondary | ICD-10-CM | POA: Diagnosis not present

## 2015-09-25 DIAGNOSIS — B079 Viral wart, unspecified: Secondary | ICD-10-CM | POA: Diagnosis not present

## 2015-09-27 DIAGNOSIS — Z1231 Encounter for screening mammogram for malignant neoplasm of breast: Secondary | ICD-10-CM | POA: Diagnosis not present

## 2015-09-28 DIAGNOSIS — G4733 Obstructive sleep apnea (adult) (pediatric): Secondary | ICD-10-CM | POA: Diagnosis not present

## 2015-10-23 ENCOUNTER — Other Ambulatory Visit: Payer: Self-pay | Admitting: Internal Medicine

## 2015-11-09 DIAGNOSIS — M1711 Unilateral primary osteoarthritis, right knee: Secondary | ICD-10-CM | POA: Diagnosis not present

## 2015-12-19 DIAGNOSIS — M1711 Unilateral primary osteoarthritis, right knee: Secondary | ICD-10-CM | POA: Diagnosis not present

## 2015-12-28 DIAGNOSIS — M1711 Unilateral primary osteoarthritis, right knee: Secondary | ICD-10-CM | POA: Diagnosis not present

## 2016-01-05 DIAGNOSIS — M1711 Unilateral primary osteoarthritis, right knee: Secondary | ICD-10-CM | POA: Diagnosis not present

## 2016-02-20 DIAGNOSIS — M4302 Spondylolysis, cervical region: Secondary | ICD-10-CM | POA: Diagnosis not present

## 2016-02-20 DIAGNOSIS — M47816 Spondylosis without myelopathy or radiculopathy, lumbar region: Secondary | ICD-10-CM | POA: Diagnosis not present

## 2016-02-22 DIAGNOSIS — E669 Obesity, unspecified: Secondary | ICD-10-CM | POA: Diagnosis not present

## 2016-02-22 DIAGNOSIS — Z23 Encounter for immunization: Secondary | ICD-10-CM | POA: Diagnosis not present

## 2016-02-22 DIAGNOSIS — G894 Chronic pain syndrome: Secondary | ICD-10-CM | POA: Diagnosis not present

## 2016-02-22 DIAGNOSIS — M8588 Other specified disorders of bone density and structure, other site: Secondary | ICD-10-CM | POA: Diagnosis not present

## 2016-02-22 DIAGNOSIS — Z6834 Body mass index (BMI) 34.0-34.9, adult: Secondary | ICD-10-CM | POA: Diagnosis not present

## 2016-02-22 DIAGNOSIS — Z0000 Encounter for general adult medical examination without abnormal findings: Secondary | ICD-10-CM | POA: Diagnosis not present

## 2016-02-22 DIAGNOSIS — J45909 Unspecified asthma, uncomplicated: Secondary | ICD-10-CM | POA: Diagnosis not present

## 2016-02-22 DIAGNOSIS — G609 Hereditary and idiopathic neuropathy, unspecified: Secondary | ICD-10-CM | POA: Diagnosis not present

## 2016-02-22 DIAGNOSIS — M1711 Unilateral primary osteoarthritis, right knee: Secondary | ICD-10-CM | POA: Diagnosis not present

## 2016-02-22 DIAGNOSIS — K219 Gastro-esophageal reflux disease without esophagitis: Secondary | ICD-10-CM | POA: Diagnosis not present

## 2016-02-22 DIAGNOSIS — M810 Age-related osteoporosis without current pathological fracture: Secondary | ICD-10-CM | POA: Diagnosis not present

## 2016-02-22 DIAGNOSIS — M8580 Other specified disorders of bone density and structure, unspecified site: Secondary | ICD-10-CM | POA: Diagnosis not present

## 2016-02-22 DIAGNOSIS — G4733 Obstructive sleep apnea (adult) (pediatric): Secondary | ICD-10-CM | POA: Diagnosis not present

## 2016-02-22 DIAGNOSIS — E785 Hyperlipidemia, unspecified: Secondary | ICD-10-CM | POA: Diagnosis not present

## 2016-02-22 DIAGNOSIS — Z1389 Encounter for screening for other disorder: Secondary | ICD-10-CM | POA: Diagnosis not present

## 2016-02-26 DIAGNOSIS — M8588 Other specified disorders of bone density and structure, other site: Secondary | ICD-10-CM | POA: Diagnosis not present

## 2016-02-28 DIAGNOSIS — L821 Other seborrheic keratosis: Secondary | ICD-10-CM | POA: Diagnosis not present

## 2016-05-16 DIAGNOSIS — I872 Venous insufficiency (chronic) (peripheral): Secondary | ICD-10-CM | POA: Diagnosis not present

## 2016-05-16 DIAGNOSIS — L308 Other specified dermatitis: Secondary | ICD-10-CM | POA: Diagnosis not present

## 2016-08-09 DIAGNOSIS — H26493 Other secondary cataract, bilateral: Secondary | ICD-10-CM | POA: Diagnosis not present

## 2016-08-28 DIAGNOSIS — Z961 Presence of intraocular lens: Secondary | ICD-10-CM | POA: Diagnosis not present

## 2016-08-28 DIAGNOSIS — H04123 Dry eye syndrome of bilateral lacrimal glands: Secondary | ICD-10-CM | POA: Diagnosis not present

## 2016-08-28 DIAGNOSIS — H26493 Other secondary cataract, bilateral: Secondary | ICD-10-CM | POA: Diagnosis not present

## 2016-08-28 DIAGNOSIS — H1851 Endothelial corneal dystrophy: Secondary | ICD-10-CM | POA: Diagnosis not present

## 2016-09-10 DIAGNOSIS — H43393 Other vitreous opacities, bilateral: Secondary | ICD-10-CM | POA: Diagnosis not present

## 2016-09-27 DIAGNOSIS — Z124 Encounter for screening for malignant neoplasm of cervix: Secondary | ICD-10-CM | POA: Diagnosis not present

## 2016-09-27 DIAGNOSIS — Z1231 Encounter for screening mammogram for malignant neoplasm of breast: Secondary | ICD-10-CM | POA: Diagnosis not present

## 2016-09-27 DIAGNOSIS — Z6835 Body mass index (BMI) 35.0-35.9, adult: Secondary | ICD-10-CM | POA: Diagnosis not present

## 2016-09-27 DIAGNOSIS — Z01419 Encounter for gynecological examination (general) (routine) without abnormal findings: Secondary | ICD-10-CM | POA: Diagnosis not present

## 2016-09-27 DIAGNOSIS — N3945 Continuous leakage: Secondary | ICD-10-CM | POA: Diagnosis not present

## 2016-11-05 DIAGNOSIS — G4733 Obstructive sleep apnea (adult) (pediatric): Secondary | ICD-10-CM | POA: Diagnosis not present

## 2017-01-22 ENCOUNTER — Other Ambulatory Visit: Payer: Self-pay | Admitting: Physician Assistant

## 2017-01-22 DIAGNOSIS — R197 Diarrhea, unspecified: Secondary | ICD-10-CM | POA: Diagnosis not present

## 2017-01-22 DIAGNOSIS — R1011 Right upper quadrant pain: Secondary | ICD-10-CM

## 2017-01-22 DIAGNOSIS — K838 Other specified diseases of biliary tract: Secondary | ICD-10-CM

## 2017-01-22 DIAGNOSIS — R110 Nausea: Secondary | ICD-10-CM | POA: Diagnosis not present

## 2017-01-24 ENCOUNTER — Ambulatory Visit
Admission: RE | Admit: 2017-01-24 | Discharge: 2017-01-24 | Disposition: A | Discharge status: Home / Self Care | Payer: Medicare Other | Source: Ambulatory Visit | Attending: Physician Assistant | Admitting: Physician Assistant

## 2017-01-24 DIAGNOSIS — R110 Nausea: Secondary | ICD-10-CM

## 2017-01-24 DIAGNOSIS — K8020 Calculus of gallbladder without cholecystitis without obstruction: Secondary | ICD-10-CM | POA: Diagnosis not present

## 2017-01-24 DIAGNOSIS — K838 Other specified diseases of biliary tract: Secondary | ICD-10-CM

## 2017-01-24 DIAGNOSIS — R1011 Right upper quadrant pain: Secondary | ICD-10-CM

## 2017-01-28 ENCOUNTER — Other Ambulatory Visit (HOSPITAL_COMMUNITY): Payer: Self-pay | Admitting: Physician Assistant

## 2017-01-28 DIAGNOSIS — K838 Other specified diseases of biliary tract: Secondary | ICD-10-CM

## 2017-01-28 DIAGNOSIS — R1011 Right upper quadrant pain: Secondary | ICD-10-CM

## 2017-01-30 ENCOUNTER — Other Ambulatory Visit (HOSPITAL_COMMUNITY): Payer: Self-pay | Admitting: Physician Assistant

## 2017-01-30 DIAGNOSIS — R1011 Right upper quadrant pain: Secondary | ICD-10-CM

## 2017-01-30 DIAGNOSIS — K838 Other specified diseases of biliary tract: Secondary | ICD-10-CM

## 2017-02-03 ENCOUNTER — Ambulatory Visit (HOSPITAL_COMMUNITY)
Admission: RE | Admit: 2017-02-03 | Discharge: 2017-02-03 | Disposition: A | Discharge status: Home / Self Care | Payer: Medicare Other | Source: Ambulatory Visit | Attending: Physician Assistant | Admitting: Physician Assistant

## 2017-02-03 DIAGNOSIS — K838 Other specified diseases of biliary tract: Secondary | ICD-10-CM | POA: Insufficient documentation

## 2017-02-03 DIAGNOSIS — R1011 Right upper quadrant pain: Secondary | ICD-10-CM

## 2017-02-03 LAB — POCT I-STAT CREATININE: CREATININE: 1 mg/dL (ref 0.44–1.00)

## 2017-02-03 MED ORDER — GADOBENATE DIMEGLUMINE 529 MG/ML IV SOLN
20.0000 mL | Freq: Once | INTRAVENOUS | Status: AC | PRN
  Administered 2017-02-03: 20 mL via INTRAVENOUS

## 2017-02-05 DIAGNOSIS — G4733 Obstructive sleep apnea (adult) (pediatric): Secondary | ICD-10-CM | POA: Diagnosis not present

## 2017-02-12 ENCOUNTER — Emergency Department (HOSPITAL_COMMUNITY)
Admission: EM | Admit: 2017-02-12 | Discharge: 2017-02-12 | Disposition: A | Discharge status: Home / Self Care | Payer: Medicare Other | Attending: Emergency Medicine | Admitting: Emergency Medicine

## 2017-02-12 ENCOUNTER — Encounter (HOSPITAL_COMMUNITY): Payer: Self-pay | Admitting: Nurse Practitioner

## 2017-02-12 DIAGNOSIS — Z5321 Procedure and treatment not carried out due to patient leaving prior to being seen by health care provider: Secondary | ICD-10-CM | POA: Insufficient documentation

## 2017-02-12 DIAGNOSIS — R109 Unspecified abdominal pain: Secondary | ICD-10-CM | POA: Insufficient documentation

## 2017-02-12 LAB — COMPREHENSIVE METABOLIC PANEL
ALBUMIN: 4 g/dL (ref 3.5–5.0)
ALT: 20 U/L (ref 14–54)
ANION GAP: 10 (ref 5–15)
AST: 22 U/L (ref 15–41)
Alkaline Phosphatase: 172 U/L — ABNORMAL HIGH (ref 38–126)
BILIRUBIN TOTAL: 0.6 mg/dL (ref 0.3–1.2)
BUN: 9 mg/dL (ref 6–20)
CHLORIDE: 105 mmol/L (ref 101–111)
CO2: 24 mmol/L (ref 22–32)
Calcium: 9.3 mg/dL (ref 8.9–10.3)
Creatinine, Ser: 0.93 mg/dL (ref 0.44–1.00)
GFR calc Af Amer: >60 mL/min (ref >60)
GFR calc non Af Amer: 59 mL/min — ABNORMAL LOW (ref >60)
Glucose, Bld: 94 mg/dL (ref 65–99)
POTASSIUM: 4.6 mmol/L (ref 3.5–5.1)
SODIUM: 139 mmol/L (ref 135–145)
TOTAL PROTEIN: 7 g/dL (ref 6.5–8.1)

## 2017-02-12 LAB — CBC
HEMATOCRIT: 39.2 % (ref 36.0–46.0)
HEMOGLOBIN: 13.4 g/dL (ref 12.0–15.0)
MCH: 31.2 pg (ref 26.0–34.0)
MCHC: 34.2 g/dL (ref 30.0–36.0)
MCV: 91.4 fL (ref 78.0–100.0)
Platelets: 254 10*3/uL (ref 150–400)
RBC: 4.29 MIL/uL (ref 3.87–5.11)
RDW: 12.5 % (ref 11.5–15.5)
WBC: 8.2 10*3/uL (ref 4.0–10.5)

## 2017-02-12 LAB — LIPASE, BLOOD: LIPASE: 20 U/L (ref 11–51)

## 2017-02-12 MED ORDER — ONDANSETRON 4 MG PO TBDP
4.0000 mg | ORAL_TABLET | Freq: Once | ORAL | Status: AC | PRN
  Administered 2017-02-12: 4 mg via ORAL
  Filled 2017-02-12: qty 1

## 2017-02-12 NOTE — ED Triage Notes (Signed)
Patient reports having RUQ/RLQ pain. She reports she has known gallstones and has an appointment with [facility] on next week. However symptoms started again on yesterday. She contacted them this morning and they could not see her and sent her to ER for work up. Patient reports they informed her that Dr. Windle Guard is oncall and it may be that she has to have surgery sooner than later. Patient is ambulatory but guarding abdomen. Continuous nausea and vomiting.

## 2017-02-12 NOTE — ED Notes (Signed)
Pt left without being seen states she didn't want to wait anymore

## 2017-02-14 ENCOUNTER — Ambulatory Visit: Payer: Self-pay | Admitting: Surgery

## 2017-02-14 DIAGNOSIS — K8010 Calculus of gallbladder with chronic cholecystitis without obstruction: Secondary | ICD-10-CM | POA: Diagnosis not present

## 2017-02-14 NOTE — H&P (Signed)
Christy Stephenson 02/14/2017 9:42 AM Location: [facility] Patient #: [id number] DOB: 25-Sep-1940 Married / Language: English / Race: White Female  History of Present Illness [name] A. Randie Tallarico MD; 02/14/2017 10:42 AM) Patient words: Patient said request of Christy Goody PA for gallstones. The patient is a history of right upper quadrant bowel pain dating back 2 years ago. She had extensive workup which was negative. The patient is recurred after eating fatty greasy meals and ultrasound was done which showed a gallstone. She had an MRCP which was negative for obstruction. Her pains become more frequent and she desires cholecystectomy.                           CLINICAL DATA: Right upper quadrant abdominal pain. EXAM: ULTRASOUND ABDOMEN LIMITED RIGHT UPPER QUADRANT COMPARISON: Ultrasound of February 25, 2013. FINDINGS: Gallbladder: Probable small gallstone measuring 5 mm is noted. No gallbladder wall thickening or pericholecystic fluid is noted. The presence or absence of sonographic Percell Miller sign was not reported by the technologist. Common bile duct: Diameter: 9.5 mm which is mildly dilated and increased compared to prior exam. Liver: Focal calcification is seen in right hepatic lobe. Echogenicity is within normal limits. Portal vein is patent on color Doppler imaging with normal direction of blood flow towards the liver. IMPRESSION: Small solitary gallstone without evidence of cholecystitis. Common bile duct is dilated at 9.5 mm ; correlation with liver function tests or MRCP is recommended to rule out distal common bile duct obstruction. Right hepatic lobe calcification is noted which most likely is benign. No significant hepatic abnormality is noted. Electronically Signed By: Marijo Conception, M.D. On: 01/24/2017 09:53                Abnormal ultrasound. Dilated common bile duct. RIGHT upper quadrant pain for  EXAM: MRI  ABDOMEN WITHOUT AND WITH CONTRAST (INCLUDING MRCP)  TECHNIQUE: Multiplanar multisequence MR imaging of the abdomen was performed both before and after the administration of intravenous contrast. Heavily T2-weighted images of the biliary and pancreatic ducts were obtained, and three-dimensional MRCP images were rendered by post processing.  CONTRAST: 73mL MULTIHANCE GADOBENATE DIMEGLUMINE 529 MG/ML IV SOLN  COMPARISON: Ultrasound 01/24/2017  FINDINGS: Lower chest: Lung bases are clear.  Hepatobiliary: The common hepatic duct measures 7 mm. The common bile duct through the pancreatic head measures 7 mm. This mild dilatation is fusiform without obstructing lesion. No filling defects within the common bile duct. The common bile duct tapers normally to the ampulla. No pancreatic duct dilatation.  There is no intrahepatic duct dilatation. Gallbladder is normal without evidence of gallstones.  There is no focal hepatic lesion. No enhancing hepatic lesion.  Pancreas: Normal pancreatic parenchymal intensity. No ductal dilatation or inflammation.  Spleen: Normal spleen.  Adrenals/urinary tract: Adrenal glands and kidneys are normal.  Stomach/Bowel: Stomach and limited of the small bowel is unremarkable  Vascular/Lymphatic: Abdominal aortic normal caliber. No retroperitoneal periportal lymphadenopathy.  Musculoskeletal: No aggressive osseous lesion  IMPRESSION: 1. Minimal dilatation of the common bile duct is favored to represent benign senescent dilatation. No obstructing lesion. 2. Normal gallbladder. 3. Normal liver parenchyma. 4. Normal pancreas.   Electronically Signed By: [name] M.D. On: 02/04/2017 08:43.  The patient is a 76 year old female.   Past Surgical History Christy Stephenson, [place]; 02/14/2017 9:42 AM) Breast Biopsy Bilateral. Cataract Surgery Bilateral. Knee Surgery Right. Tonsillectomy  Diagnostic Studies History Christy Stephenson, [place];  02/14/2017 9:42 AM) Colonoscopy 1-5 years  ago Mammogram within last year Pap Smear 1-5 years ago  Allergies Christy Stephenson, [place]; 02/14/2017 9:46 AM) Lipitor *ANTIHYPERLIPIDEMICS* Wellbutrin *ANTIDEPRESSANTS* Actifed *COUGH/COLD/ALLERGY* Allergies Reconciled  Medication History Christy Stephenson, RMA; 02/14/2017 9:49 AM) Diclofenac Sodium (75MG  Tablet DR, Oral) Active. Estradiol (1MG  Tablet, Oral) Active. Gabapentin (600MG  Tablet, Oral) Active. Progesterone Micronized (100MG  Capsule, Oral) Active. NexIUM (20MG  Capsule DR, Oral) Active. Multivitamin Adult (Oral) Active. Vitamin C (100MG  Tablet, Oral) Active. Vitamin D (Cholecalciferol) (1000UNIT Capsule, Oral) Active. Vitamin E (100UNIT Capsule, Oral) Active. Advair Diskus (250-50MCG/DOSE Aero Pow Br Act, Inhalation) Active. Gabapentin (600MG  Tablet, Oral) Active. Calcium (500MG  Tablet, Oral) Active. Medications Reconciled  Social History Christy Stephenson, [place]; 02/14/2017 9:42 AM) Caffeine use Carbonated beverages, Coffee. Illicit drug use Prefer to discuss with provider. No alcohol use Tobacco use Never smoker.  Family History Christy Stephenson, [place]; 02/14/2017 9:42 AM) Alcohol Abuse Father. Arthritis Mother. Cerebrovascular Accident Father. Diabetes Mellitus Mother. Heart Disease Father. Heart disease in female family member before age 75 Hypertension Father, Mother. Respiratory Condition Father. Thyroid problems Mother.  Pregnancy / Birth History Christy Stephenson, [place]; 02/14/2017 9:42 AM) Age at menarche 69 years. Age of menopause 49-50 Contraceptive History Contraceptive implant, Oral contraceptives. Gravida 2 Maternal age 65-20 Para 2 Regular periods  Other Problems Christy Stephenson, [place]; 02/14/2017 9:42 AM) Arthritis Cerebrovascular Accident Gastroesophageal Reflux Disease Hemorrhoids Hypercholesterolemia Lump In Breast Migraine Headache Sleep Apnea     Review of Systems  Christy Stephenson RMA; 02/14/2017 9:42 AM) General Present- Appetite Loss and Chills. Not Present- Fatigue, Fever, Night Sweats, Weight Gain and Weight Loss. Skin Not Present- Change in Wart/Mole, Dryness, Hives, Jaundice, New Lesions, Non-Healing Wounds, Rash and Ulcer. HEENT Not Present- Earache, Hearing Loss, Hoarseness, Nose Bleed, Oral Ulcers, Ringing in the Ears, Seasonal Allergies, Sinus Pain, Sore Throat, Visual Disturbances, Wears glasses/contact lenses and Yellow Eyes. Respiratory Not Present- Bloody sputum, Chronic Cough, Difficulty Breathing, Snoring and Wheezing. Breast Not Present- Breast Mass, Breast Pain, Nipple Discharge and Skin Changes. Cardiovascular Not Present- Chest Pain, Difficulty Breathing Lying Down, Leg Cramps, Palpitations, Rapid Heart Rate, Shortness of Breath and Swelling of Extremities. Gastrointestinal Present- Abdominal Pain, Bloating, Chronic diarrhea, Excessive gas, Gets full quickly at meals and Nausea. Not Present- Bloody Stool, Change in Bowel Habits, Constipation, Difficulty Swallowing, Hemorrhoids, Indigestion, Rectal Pain and Vomiting. Female Genitourinary Present- Urgency. Not Present- Frequency, Nocturia, Painful Urination and Pelvic Pain. Musculoskeletal Present- Joint Stiffness. Not Present- Back Pain, Joint Pain, Muscle Pain, Muscle Weakness and Swelling of Extremities. Neurological Present- Numbness. Not Present- Decreased Memory, Fainting, Headaches, Seizures, Tingling, Tremor, Trouble walking and Weakness. Psychiatric Not Present- Anxiety, Bipolar, Change in Sleep Pattern, Depression, Fearful and Frequent crying. Endocrine Present- Cold Intolerance. Not Present- Excessive Hunger, Hair Changes, Heat Intolerance, Hot flashes and New Diabetes. Hematology Not Present- Blood Thinners, Easy Bruising, Excessive bleeding, Gland problems, HIV and Persistent Infections.  Vitals Mardene Celeste King RMA; 02/14/2017 9:45 AM) 02/14/2017 9:44 AM Weight: 204 lb Height:  67in Body Surface Area: 2.04 m Body Mass Index: 31.95 kg/m  Temp.: 98.58F  Pulse: 75 (Regular)  BP: 135/72 (Sitting, Left Arm, Standard)      Physical Exam (Johara Lodwick A. Tonyetta Berko MD; 02/14/2017 10:35 AM)  General Mental Status-Alert. General Appearance-Consistent with stated age. Hydration-Well hydrated. Voice-Normal.  Head and Neck Head-normocephalic, atraumatic with no lesions or palpable masses.  Eye Eyeball - Bilateral-Extraocular movements intact. Sclera/Conjunctiva - Bilateral-No scleral icterus.  Chest and Lung Exam Chest and lung exam reveals -quiet, even and easy respiratory effort with no use of accessory muscles and on  auscultation, normal breath sounds, no adventitious sounds and normal vocal resonance. Inspection Chest Wall - Normal. Back - normal.  Cardiovascular Cardiovascular examination reveals -on palpation PMI is normal in location and amplitude, no palpable S3 or S4. Normal cardiac borders., normal heart sounds, regular rate and rhythm with no murmurs, carotid auscultation reveals no bruits and normal pedal pulses bilaterally.  Abdomen Inspection Inspection of the abdomen reveals - No Hernias. Skin - Scar - no surgical scars. Palpation/Percussion Palpation and Percussion of the abdomen reveal - Soft, Non Tender, No Rebound tenderness, No Rigidity (guarding) and No hepatosplenomegaly. Auscultation Auscultation of the abdomen reveals - Bowel sounds normal.  Neurologic Neurologic evaluation reveals -alert and oriented x 3 with no impairment of recent or remote memory. Mental Status-Normal.  Musculoskeletal Normal Exam - Left-Upper Extremity Strength Normal and Lower Extremity Strength Normal. Normal Exam - Right-Upper Extremity Strength Normal, Lower Extremity Weakness.    Assessment & Plan (Dynasty Holquin A. Ayaansh Smail MD; 02/14/2017 10:41 AM)  CHRONIC CHOLECYSTITIS WITH CALCULUS (K80.10) Impression: recommend laparoscopic  cholecystecomy The procedure has been discussed with the patient. Risks of laparoscopic cholecystectomy include bleeding, infection, bile duct injury, leak, death, open surgery, diarrhea, other surgery, organ injury, blood vessel injury, DVT, and additional care.  Current Plans You are being scheduled for surgery- Our schedulers will call you.  You should hear from our office's scheduling department within 5 working days about the location, date, and time of surgery. We try to make accommodations for patient's preferences in scheduling surgery, but sometimes the OR schedule or the surgeon's schedule prevents [place] from making those accommodations.  If you have not heard from our office [phone]) in 5 working days, call the office and ask for your surgeon's nurse.  If you have other questions about your diagnosis, plan, or surgery, call the office and ask for your surgeon's nurse.  Pt Education - Pamphlet Given - Laparoscopic Gallbladder Surgery: discussed with patient and provided information. Written instructions provided The anatomy & physiology of hepatobiliary & pancreatic function was discussed. The pathophysiology of gallbladder dysfunction was discussed. Natural history risks without surgery was discussed. I feel the risks of no intervention will lead to serious problems that outweigh the operative risks; therefore, I recommended cholecystectomy to remove the pathology. I explained laparoscopic techniques with possible need for an open approach. Probable cholangiogram to evaluate the bilary tract was explained as well.  Risks such as bleeding, infection, abscess, leak, injury to other organs, need for further treatment, heart attack, death, and other risks were discussed. I noted a good likelihood this will help address the problem. Possibility that this will not correct all abdominal symptoms was explained. Goals of post-operative recovery were discussed as well. We will work  to minimize complications. An educational handout further explaining the pathology and treatment options was given as well. Questions were answered. The patient expresses understanding & wishes to proceed with surgery.  Pt Education - [facility] Laparosopic Post Op HCI Pt Education - [facility] Good Bowel Health Pt Education - Laparoscopic Cholecystectomy: gallbladder

## 2017-02-17 ENCOUNTER — Encounter (HOSPITAL_COMMUNITY): Payer: Self-pay | Admitting: *Deleted

## 2017-02-17 MED ORDER — CEFAZOLIN SODIUM-DEXTROSE 2-4 GM/100ML-% IV SOLN
2.0000 g | INTRAVENOUS | Status: AC
  Administered 2017-02-18: 2 g via INTRAVENOUS
  Filled 2017-02-17: qty 100

## 2017-02-17 NOTE — Progress Notes (Addendum)
Christy Stephenson denies chest pain or shortness of breath. Pt does not see a cardiologist.  Patient has sleep apnea and has a CPAP, was tested over 7 years ago at [facility]. I instructed patient to bring nasal pillows, just in case she is admitted.  Patient had labs drawn in ED on 02/12/17 , Alk Phos was 176- no need to repeat per Angela Kabbe NP. 

## 2017-02-18 ENCOUNTER — Ambulatory Visit (HOSPITAL_COMMUNITY): Payer: Medicare Other

## 2017-02-18 ENCOUNTER — Encounter (HOSPITAL_COMMUNITY)
Admission: RE | Disposition: A | Discharge status: Home / Self Care | Payer: Self-pay | Source: Ambulatory Visit | Attending: Surgery

## 2017-02-18 ENCOUNTER — Ambulatory Visit (HOSPITAL_COMMUNITY): Payer: Medicare Other | Admitting: Anesthesiology

## 2017-02-18 ENCOUNTER — Ambulatory Visit (HOSPITAL_COMMUNITY)
Admission: RE | Admit: 2017-02-18 | Discharge: 2017-02-18 | Disposition: A | Discharge status: Home / Self Care | Payer: Medicare Other | Source: Ambulatory Visit | Attending: Surgery | Admitting: Surgery

## 2017-02-18 ENCOUNTER — Encounter (HOSPITAL_COMMUNITY): Payer: Self-pay

## 2017-02-18 DIAGNOSIS — Z79899 Other long term (current) drug therapy: Secondary | ICD-10-CM | POA: Diagnosis not present

## 2017-02-18 DIAGNOSIS — Z8673 Personal history of transient ischemic attack (TIA), and cerebral infarction without residual deficits: Secondary | ICD-10-CM | POA: Diagnosis not present

## 2017-02-18 DIAGNOSIS — Z7982 Long term (current) use of aspirin: Secondary | ICD-10-CM | POA: Insufficient documentation

## 2017-02-18 DIAGNOSIS — E7800 Pure hypercholesterolemia, unspecified: Secondary | ICD-10-CM | POA: Insufficient documentation

## 2017-02-18 DIAGNOSIS — R0789 Other chest pain: Secondary | ICD-10-CM | POA: Diagnosis not present

## 2017-02-18 DIAGNOSIS — K8010 Calculus of gallbladder with chronic cholecystitis without obstruction: Secondary | ICD-10-CM | POA: Insufficient documentation

## 2017-02-18 DIAGNOSIS — K8020 Calculus of gallbladder without cholecystitis without obstruction: Secondary | ICD-10-CM | POA: Diagnosis not present

## 2017-02-18 DIAGNOSIS — G4730 Sleep apnea, unspecified: Secondary | ICD-10-CM | POA: Insufficient documentation

## 2017-02-18 DIAGNOSIS — Z419 Encounter for procedure for purposes other than remedying health state, unspecified: Secondary | ICD-10-CM

## 2017-02-18 DIAGNOSIS — M1990 Unspecified osteoarthritis, unspecified site: Secondary | ICD-10-CM | POA: Insufficient documentation

## 2017-02-18 DIAGNOSIS — K219 Gastro-esophageal reflux disease without esophagitis: Secondary | ICD-10-CM | POA: Diagnosis not present

## 2017-02-18 DIAGNOSIS — R0600 Dyspnea, unspecified: Secondary | ICD-10-CM | POA: Diagnosis not present

## 2017-02-18 DIAGNOSIS — K811 Chronic cholecystitis: Secondary | ICD-10-CM | POA: Diagnosis not present

## 2017-02-18 DIAGNOSIS — G4733 Obstructive sleep apnea (adult) (pediatric): Secondary | ICD-10-CM | POA: Diagnosis not present

## 2017-02-18 HISTORY — DX: Polyneuropathy, unspecified: G62.9

## 2017-02-18 HISTORY — DX: Cerebral infarction, unspecified: I63.9

## 2017-02-18 HISTORY — DX: Pneumonia, unspecified organism: J18.9

## 2017-02-18 HISTORY — PX: CHOLECYSTECTOMY: SHX55

## 2017-02-18 HISTORY — DX: Malignant (primary) neoplasm, unspecified: C80.1

## 2017-02-18 SURGERY — LAPAROSCOPIC CHOLECYSTECTOMY WITH INTRAOPERATIVE CHOLANGIOGRAM
Anesthesia: General | Site: Abdomen

## 2017-02-18 MED ORDER — NEOSTIGMINE METHYLSULFATE 5 MG/5ML IV SOSY
PREFILLED_SYRINGE | INTRAVENOUS | Status: DC | PRN
  Administered 2017-02-18: 4 mg via INTRAVENOUS

## 2017-02-18 MED ORDER — OXYCODONE HCL 5 MG PO TABS: 5.0000 mg | ORAL_TABLET | Freq: Four times a day (QID) | ORAL | 0 refills | Status: DC | PRN

## 2017-02-18 MED ORDER — HYDROCODONE-ACETAMINOPHEN 5-325 MG PO TABS
1.0000 | ORAL_TABLET | ORAL | Status: DC | PRN
  Administered 2017-02-18: 1 via ORAL

## 2017-02-18 MED ORDER — FENTANYL CITRATE (PF) 100 MCG/2ML IJ SOLN
INTRAMUSCULAR | Status: AC
  Filled 2017-02-18: qty 2

## 2017-02-18 MED ORDER — METOCLOPRAMIDE HCL 5 MG/ML IJ SOLN
10.0000 mg | Freq: Once | INTRAMUSCULAR | Status: AC | PRN
  Administered 2017-02-18: 10 mg via INTRAVENOUS

## 2017-02-18 MED ORDER — ONDANSETRON HCL 4 MG/2ML IJ SOLN
INTRAMUSCULAR | Status: DC | PRN
  Administered 2017-02-18: 4 mg via INTRAVENOUS

## 2017-02-18 MED ORDER — ROCURONIUM BROMIDE 10 MG/ML (PF) SYRINGE
PREFILLED_SYRINGE | INTRAVENOUS | Status: AC
  Filled 2017-02-18: qty 5

## 2017-02-18 MED ORDER — CHLORHEXIDINE GLUCONATE CLOTH 2 % EX PADS: 6.0000 | MEDICATED_PAD | Freq: Once | CUTANEOUS | Status: DC

## 2017-02-18 MED ORDER — FENTANYL CITRATE (PF) 250 MCG/5ML IJ SOLN
INTRAMUSCULAR | Status: AC
  Filled 2017-02-18: qty 5

## 2017-02-18 MED ORDER — DEXAMETHASONE SODIUM PHOSPHATE 10 MG/ML IJ SOLN
INTRAMUSCULAR | Status: AC
  Filled 2017-02-18: qty 1

## 2017-02-18 MED ORDER — NEOSTIGMINE METHYLSULFATE 5 MG/5ML IV SOSY
PREFILLED_SYRINGE | INTRAVENOUS | Status: AC
  Filled 2017-02-18: qty 5

## 2017-02-18 MED ORDER — SODIUM CHLORIDE 0.9 % IV SOLN
INTRAVENOUS | Status: DC | PRN
  Administered 2017-02-18: 20 mL

## 2017-02-18 MED ORDER — DEXAMETHASONE SODIUM PHOSPHATE 10 MG/ML IJ SOLN
INTRAMUSCULAR | Status: DC | PRN
  Administered 2017-02-18: 10 mg via INTRAVENOUS

## 2017-02-18 MED ORDER — 0.9 % SODIUM CHLORIDE (POUR BTL) OPTIME
TOPICAL | Status: DC | PRN
  Administered 2017-02-18: 1000 mL

## 2017-02-18 MED ORDER — ONDANSETRON HCL 4 MG/2ML IJ SOLN
INTRAMUSCULAR | Status: AC
  Filled 2017-02-18: qty 2

## 2017-02-18 MED ORDER — SODIUM CHLORIDE 0.9 % IR SOLN
Status: DC | PRN
  Administered 2017-02-18: 1000 mL

## 2017-02-18 MED ORDER — IOPAMIDOL (ISOVUE-300) INJECTION 61%
INTRAVENOUS | Status: AC
  Filled 2017-02-18: qty 50

## 2017-02-18 MED ORDER — MEPERIDINE HCL 25 MG/ML IJ SOLN: 6.2500 mg | INTRAMUSCULAR | Status: DC | PRN

## 2017-02-18 MED ORDER — FENTANYL CITRATE (PF) 100 MCG/2ML IJ SOLN
INTRAMUSCULAR | Status: DC | PRN
  Administered 2017-02-18: 50 ug via INTRAVENOUS
  Administered 2017-02-18 (×2): 100 ug via INTRAVENOUS

## 2017-02-18 MED ORDER — METOCLOPRAMIDE HCL 5 MG/ML IJ SOLN
INTRAMUSCULAR | Status: AC
  Filled 2017-02-18: qty 2

## 2017-02-18 MED ORDER — LACTATED RINGERS IV SOLN
INTRAVENOUS | Status: DC
  Administered 2017-02-18 (×3): via INTRAVENOUS

## 2017-02-18 MED ORDER — BUPIVACAINE-EPINEPHRINE (PF) 0.25% -1:200000 IJ SOLN
INTRAMUSCULAR | Status: AC
  Filled 2017-02-18: qty 30

## 2017-02-18 MED ORDER — HYDROCODONE-ACETAMINOPHEN 5-325 MG PO TABS
ORAL_TABLET | ORAL | Status: AC
  Filled 2017-02-18: qty 1

## 2017-02-18 MED ORDER — ROCURONIUM BROMIDE 10 MG/ML (PF) SYRINGE
PREFILLED_SYRINGE | INTRAVENOUS | Status: DC | PRN
  Administered 2017-02-18: 50 mg via INTRAVENOUS

## 2017-02-18 MED ORDER — BUPIVACAINE-EPINEPHRINE 0.25% -1:200000 IJ SOLN
INTRAMUSCULAR | Status: DC | PRN
  Administered 2017-02-18: 10 mL

## 2017-02-18 MED ORDER — LIDOCAINE 2% (20 MG/ML) 5 ML SYRINGE
INTRAMUSCULAR | Status: DC | PRN
  Administered 2017-02-18: 60 mg via INTRAVENOUS

## 2017-02-18 MED ORDER — LIDOCAINE 2% (20 MG/ML) 5 ML SYRINGE
INTRAMUSCULAR | Status: AC
  Filled 2017-02-18: qty 5

## 2017-02-18 MED ORDER — FENTANYL CITRATE (PF) 100 MCG/2ML IJ SOLN
25.0000 ug | INTRAMUSCULAR | Status: DC | PRN
  Administered 2017-02-18 (×5): 25 ug via INTRAVENOUS

## 2017-02-18 MED ORDER — PROPOFOL 10 MG/ML IV BOLUS
INTRAVENOUS | Status: DC | PRN
  Administered 2017-02-18: 100 mg via INTRAVENOUS
  Administered 2017-02-18: 10 mg via INTRAVENOUS

## 2017-02-18 MED ORDER — GLYCOPYRROLATE 0.2 MG/ML IV SOSY
PREFILLED_SYRINGE | INTRAVENOUS | Status: DC | PRN
  Administered 2017-02-18: 0.6 mg via INTRAVENOUS

## 2017-02-18 SURGICAL SUPPLY — 42 items
ADH SKN CLS APL DERMABOND .7 (GAUZE/BANDAGES/DRESSINGS) ×1
APPLIER CLIP ROT 10 11.4 M/L (STAPLE) ×3
APR CLP MED LRG 11.4X10 (STAPLE) ×1
BAG SPEC RTRVL LRG 6X4 10 (ENDOMECHANICALS) ×1
BLADE CLIPPER SURG (BLADE) IMPLANT
CANISTER SUCT 3000ML PPV (MISCELLANEOUS) ×3 IMPLANT
CHLORAPREP W/TINT 26ML (MISCELLANEOUS) ×3 IMPLANT
CLIP APPLIE ROT 10 11.4 M/L (STAPLE) ×1 IMPLANT
COVER MAYO STAND STRL (DRAPES) ×3 IMPLANT
COVER SURGICAL LIGHT HANDLE (MISCELLANEOUS) ×3 IMPLANT
DERMABOND ADVANCED (GAUZE/BANDAGES/DRESSINGS) ×2
DERMABOND ADVANCED .7 DNX12 (GAUZE/BANDAGES/DRESSINGS) ×1 IMPLANT
DRAPE C-ARM 42X72 X-RAY (DRAPES) ×3 IMPLANT
DRAPE WARM FLUID 44X44 (DRAPE) ×3 IMPLANT
ELECT REM PT RETURN 9FT ADLT (ELECTROSURGICAL) ×3
ELECTRODE REM PT RTRN 9FT ADLT (ELECTROSURGICAL) ×1 IMPLANT
GLOVE BIO SURGEON STRL SZ8 (GLOVE) ×3 IMPLANT
GLOVE BIOGEL PI IND STRL 8 (GLOVE) ×1 IMPLANT
GLOVE BIOGEL PI INDICATOR 8 (GLOVE) ×2
GOWN STRL REUS W/ TWL LRG LVL3 (GOWN DISPOSABLE) ×2 IMPLANT
GOWN STRL REUS W/ TWL XL LVL3 (GOWN DISPOSABLE) ×1 IMPLANT
GOWN STRL REUS W/TWL LRG LVL3 (GOWN DISPOSABLE) ×4
GOWN STRL REUS W/TWL XL LVL3 (GOWN DISPOSABLE) ×3
KIT BASIN OR (CUSTOM PROCEDURE TRAY) ×3 IMPLANT
KIT ROOM TURNOVER OR (KITS) ×3 IMPLANT
NS IRRIG 1000ML POUR BTL (IV SOLUTION) ×3 IMPLANT
PAD ARMBOARD 7.5X6 YLW CONV (MISCELLANEOUS) ×3 IMPLANT
POUCH SPECIMEN RETRIEVAL 10MM (ENDOMECHANICALS) ×3 IMPLANT
SCISSORS LAP 5X35 DISP (ENDOMECHANICALS) ×3 IMPLANT
SET CHOLANGIOGRAPH 5 50 .035 (SET/KITS/TRAYS/PACK) ×3 IMPLANT
SET IRRIG TUBING LAPAROSCOPIC (IRRIGATION / IRRIGATOR) ×3 IMPLANT
SLEEVE ENDOPATH XCEL 5M (ENDOMECHANICALS) ×3 IMPLANT
SPECIMEN JAR SMALL (MISCELLANEOUS) ×3 IMPLANT
SUT MNCRL AB 4-0 PS2 18 (SUTURE) ×3 IMPLANT
SUT VICRYL 0 UR6 27IN ABS (SUTURE) ×3 IMPLANT
TOWEL OR 17X24 6PK STRL BLUE (TOWEL DISPOSABLE) ×3 IMPLANT
TOWEL OR 17X26 10 PK STRL BLUE (TOWEL DISPOSABLE) ×3 IMPLANT
TRAY LAPAROSCOPIC MC (CUSTOM PROCEDURE TRAY) ×3 IMPLANT
TROCAR XCEL BLUNT TIP 100MML (ENDOMECHANICALS) ×3 IMPLANT
TROCAR XCEL NON-BLD 11X100MML (ENDOMECHANICALS) ×3 IMPLANT
TROCAR XCEL NON-BLD 5MMX100MML (ENDOMECHANICALS) ×3 IMPLANT
TUBING INSUFFLATION (TUBING) ×3 IMPLANT

## 2017-02-18 NOTE — Interval H&P Note (Signed)
History and Physical Interval Note:  02/18/2017 9:59 AM  Christy Stephenson  has presented today for surgery, with the diagnosis of GALLSTONES  The various methods of treatment have been discussed with the patient and family. After consideration of risks, benefits and other options for treatment, the patient has consented to  Procedure(s): LAPAROSCOPIC CHOLECYSTECTOMY WITH INTRAOPERATIVE CHOLANGIOGRAM (N/A) as a surgical intervention .  The patient's history has been reviewed, patient examined, no change in status, stable for surgery.  I have reviewed the patient's chart and labs.  Questions were answered to the patient's satisfaction.     Allen Basista A.

## 2017-02-18 NOTE — Progress Notes (Signed)
Pt walked to br voided qs in toilet daughter and pt ready to go states they do not need to talk to anyone else breathing clear

## 2017-02-18 NOTE — Anesthesia Postprocedure Evaluation (Addendum)
Anesthesia Post Note  Patient: Nasira A Resendes  Procedure(s) Performed: LAPAROSCOPIC CHOLECYSTECTOMY WITH INTRAOPERATIVE CHOLANGIOGRAM (N/A Abdomen)     Patient location during evaluation: PACU Anesthesia Type: General Level of consciousness: awake and alert Pain management: pain level controlled Vital Signs Assessment: post-procedure vital signs reviewed and stable Respiratory status: spontaneous breathing, nonlabored ventilation, respiratory function stable and patient connected to nasal cannula oxygen Cardiovascular status: blood pressure returned to baseline and stable Postop Assessment: no apparent nausea or vomiting Anesthetic complications: no    Last Vitals:  Vitals:   02/18/17 1253 02/18/17 1310  BP: 138/69 124/60  Pulse: 65 77  Resp: 15 12  Temp:  36.5 C  SpO2: 95% 94%    Last Pain:  Vitals:   02/18/17 1310  TempSrc:   PainSc: 2                  Araf Clugston DAVID

## 2017-02-18 NOTE — H&P (View-Only) (Signed)
Christy Stephenson 02/14/2017 9:42 AM Location: [facility] Patient #: [id number] DOB: [birth year] Married / Language: English / Race: White Female  History of Present Illness [name] A. Jameeka Marcy MD; 02/14/2017 10:42 AM) Patient words: Patient said request of [name] PA for gallstones. The patient is a history of right upper quadrant bowel pain dating back 2 years ago. She had extensive workup which was negative. The patient is recurred after eating fatty greasy meals and ultrasound was done which showed a gallstone. She had an MRCP which was negative for obstruction. Her pains become more frequent and she desires cholecystectomy.                           CLINICAL DATA: Right upper quadrant abdominal pain. EXAM: ULTRASOUND ABDOMEN LIMITED RIGHT UPPER QUADRANT COMPARISON: Ultrasound of February 25, 2013. FINDINGS: Gallbladder: Probable small gallstone measuring 5 mm is noted. No gallbladder wall thickening or pericholecystic fluid is noted. The presence or absence of sonographic Percell Miller sign was not reported by the technologist. Common bile duct: Diameter: 9.5 mm which is mildly dilated and increased compared to prior exam. Liver: Focal calcification is seen in right hepatic lobe. Echogenicity is within normal limits. Portal vein is patent on color Doppler imaging with normal direction of blood flow towards the liver. IMPRESSION: Small solitary gallstone without evidence of cholecystitis. Common bile duct is dilated at 9.5 mm ; correlation with liver function tests or MRCP is recommended to rule out distal common bile duct obstruction. Right hepatic lobe calcification is noted which most likely is benign. No significant hepatic abnormality is noted. Electronically Signed By: Marijo Conception, M.D. On: 01/24/2017 09:53                Abnormal ultrasound. Dilated common bile duct. RIGHT upper quadrant pain for  EXAM: MRI  ABDOMEN WITHOUT AND WITH CONTRAST (INCLUDING MRCP)  TECHNIQUE: Multiplanar multisequence MR imaging of the abdomen was performed both before and after the administration of intravenous contrast. Heavily T2-weighted images of the biliary and pancreatic ducts were obtained, and three-dimensional MRCP images were rendered by post processing.  CONTRAST: 43mL MULTIHANCE GADOBENATE DIMEGLUMINE 529 MG/ML IV SOLN  COMPARISON: Ultrasound 01/24/2017  FINDINGS: Lower chest: Lung bases are clear.  Hepatobiliary: The common hepatic duct measures 7 mm. The common bile duct through the pancreatic head measures 7 mm. This mild dilatation is fusiform without obstructing lesion. No filling defects within the common bile duct. The common bile duct tapers normally to the ampulla. No pancreatic duct dilatation.  There is no intrahepatic duct dilatation. Gallbladder is normal without evidence of gallstones.  There is no focal hepatic lesion. No enhancing hepatic lesion.  Pancreas: Normal pancreatic parenchymal intensity. No ductal dilatation or inflammation.  Spleen: Normal spleen.  Adrenals/urinary tract: Adrenal glands and kidneys are normal.  Stomach/Bowel: Stomach and limited of the small bowel is unremarkable  Vascular/Lymphatic: Abdominal aortic normal caliber. No retroperitoneal periportal lymphadenopathy.  Musculoskeletal: No aggressive osseous lesion  IMPRESSION: 1. Minimal dilatation of the common bile duct is favored to represent benign senescent dilatation. No obstructing lesion. 2. Normal gallbladder. 3. Normal liver parenchyma. 4. Normal pancreas.   Electronically Signed By: [name] M.D. On: 02/04/2017 08:43.  The patient is a 76 year old female.   Past Surgical History [name], [place]; 02/14/2017 9:42 AM) Breast Biopsy Bilateral. Cataract Surgery Bilateral. Knee Surgery Right. Tonsillectomy  Diagnostic Studies History [name], [place];  02/14/2017 9:42 AM) Colonoscopy 1-5 years  ago Mammogram within last year Pap Smear 1-5 years ago  Allergies [name], [place]; 02/14/2017 9:46 AM) Lipitor *ANTIHYPERLIPIDEMICS* Wellbutrin *ANTIDEPRESSANTS* Actifed *COUGH/COLD/ALLERGY* Allergies Reconciled  Medication History [name], RMA; 02/14/2017 9:49 AM) Diclofenac Sodium (75MG  Tablet DR, Oral) Active. Estradiol (1MG  Tablet, Oral) Active. Gabapentin (600MG  Tablet, Oral) Active. Progesterone Micronized (100MG  Capsule, Oral) Active. NexIUM (20MG  Capsule DR, Oral) Active. Multivitamin Adult (Oral) Active. Vitamin C (100MG  Tablet, Oral) Active. Vitamin D (Cholecalciferol) (1000UNIT Capsule, Oral) Active. Vitamin E (100UNIT Capsule, Oral) Active. Advair Diskus (250-50MCG/DOSE Aero Pow Br Act, Inhalation) Active. Gabapentin (600MG  Tablet, Oral) Active. Calcium (500MG  Tablet, Oral) Active. Medications Reconciled  Social History [name], [place]; 02/14/2017 9:42 AM) Caffeine use Carbonated beverages, Coffee. Illicit drug use Prefer to discuss with provider. No alcohol use Tobacco use Never smoker.  Family History [name], [place]; 02/14/2017 9:42 AM) Alcohol Abuse Father. Arthritis Mother. Cerebrovascular Accident Father. Diabetes Mellitus Mother. Heart Disease Father. Heart disease in female family member before age 24 Hypertension Father, Mother. Respiratory Condition Father. Thyroid problems Mother.  Pregnancy / Birth History [name], [place]; 02/14/2017 9:42 AM) Age at menarche 64 years. Age of menopause 72-50 Contraceptive History Contraceptive implant, Oral contraceptives. Gravida 2 Maternal age 59-20 Para 2 Regular periods  Other Problems [name], [place]; 02/14/2017 9:42 AM) Arthritis Cerebrovascular Accident Gastroesophageal Reflux Disease Hemorrhoids Hypercholesterolemia Lump In Breast Migraine Headache Sleep Apnea     Review of Systems  [name] RMA; 02/14/2017 9:42 AM) General Present- Appetite Loss and Chills. Not Present- Fatigue, Fever, Night Sweats, Weight Gain and Weight Loss. Skin Not Present- Change in Wart/Mole, Dryness, Hives, Jaundice, New Lesions, Non-Healing Wounds, Rash and Ulcer. HEENT Not Present- Earache, Hearing Loss, Hoarseness, Nose Bleed, Oral Ulcers, Ringing in the Ears, Seasonal Allergies, Sinus Pain, Sore Throat, Visual Disturbances, Wears glasses/contact lenses and Yellow Eyes. Respiratory Not Present- Bloody sputum, Chronic Cough, Difficulty Breathing, Snoring and Wheezing. Breast Not Present- Breast Mass, Breast Pain, Nipple Discharge and Skin Changes. Cardiovascular Not Present- Chest Pain, Difficulty Breathing Lying Down, Leg Cramps, Palpitations, Rapid Heart Rate, Shortness of Breath and Swelling of Extremities. Gastrointestinal Present- Abdominal Pain, Bloating, Chronic diarrhea, Excessive gas, Gets full quickly at meals and Nausea. Not Present- Bloody Stool, Change in Bowel Habits, Constipation, Difficulty Swallowing, Hemorrhoids, Indigestion, Rectal Pain and Vomiting. Female Genitourinary Present- Urgency. Not Present- Frequency, Nocturia, Painful Urination and Pelvic Pain. Musculoskeletal Present- Joint Stiffness. Not Present- Back Pain, Joint Pain, Muscle Pain, Muscle Weakness and Swelling of Extremities. Neurological Present- Numbness. Not Present- Decreased Memory, Fainting, Headaches, Seizures, Tingling, Tremor, Trouble walking and Weakness. Psychiatric Not Present- Anxiety, Bipolar, Change in Sleep Pattern, Depression, Fearful and Frequent crying. Endocrine Present- Cold Intolerance. Not Present- Excessive Hunger, Hair Changes, Heat Intolerance, Hot flashes and New Diabetes. Hematology Not Present- Blood Thinners, Easy Bruising, Excessive bleeding, Gland problems, HIV and Persistent Infections.  Vitals Mardene Celeste King RMA; 02/14/2017 9:45 AM) 02/14/2017 9:44 AM Weight: 204 lb Height:  67in Body Surface Area: 2.04 m Body Mass Index: 31.95 kg/m  Temp.: 98.51F  Pulse: 75 (Regular)  BP: 135/72 (Sitting, Left Arm, Standard)      Physical Exam (Ubaldo Daywalt A. Derreon Consalvo MD; 02/14/2017 10:35 AM)  General Mental Status-Alert. General Appearance-Consistent with stated age. Hydration-Well hydrated. Voice-Normal.  Head and Neck Head-normocephalic, atraumatic with no lesions or palpable masses.  Eye Eyeball - Bilateral-Extraocular movements intact. Sclera/Conjunctiva - Bilateral-No scleral icterus.  Chest and Lung Exam Chest and lung exam reveals -quiet, even and easy respiratory effort with no use of accessory muscles and on  auscultation, normal breath sounds, no adventitious sounds and normal vocal resonance. Inspection Chest Wall - Normal. Back - normal.  Cardiovascular Cardiovascular examination reveals -on palpation PMI is normal in location and amplitude, no palpable S3 or S4. Normal cardiac borders., normal heart sounds, regular rate and rhythm with no murmurs, carotid auscultation reveals no bruits and normal pedal pulses bilaterally.  Abdomen Inspection Inspection of the abdomen reveals - No Hernias. Skin - Scar - no surgical scars. Palpation/Percussion Palpation and Percussion of the abdomen reveal - Soft, Non Tender, No Rebound tenderness, No Rigidity (guarding) and No hepatosplenomegaly. Auscultation Auscultation of the abdomen reveals - Bowel sounds normal.  Neurologic Neurologic evaluation reveals -alert and oriented x 3 with no impairment of recent or remote memory. Mental Status-Normal.  Musculoskeletal Normal Exam - Left-Upper Extremity Strength Normal and Lower Extremity Strength Normal. Normal Exam - Right-Upper Extremity Strength Normal, Lower Extremity Weakness.    Assessment & Plan (Orlin Kann A. Drianna Chandran MD; 02/14/2017 10:41 AM)  CHRONIC CHOLECYSTITIS WITH CALCULUS (K80.10) Impression: recommend laparoscopic  cholecystecomy The procedure has been discussed with the patient. Risks of laparoscopic cholecystectomy include bleeding, infection, bile duct injury, leak, death, open surgery, diarrhea, other surgery, organ injury, blood vessel injury, DVT, and additional care.  Current Plans You are being scheduled for surgery- Our schedulers will call you.  You should hear from our office's scheduling department within 5 working days about the location, date, and time of surgery. We try to make accommodations for patient's preferences in scheduling surgery, but sometimes the OR schedule or the surgeon's schedule prevents [place] from making those accommodations.  If you have not heard from our office [phone]) in 5 working days, call the office and ask for your surgeon's nurse.  If you have other questions about your diagnosis, plan, or surgery, call the office and ask for your surgeon's nurse.  Pt Education - Pamphlet Given - Laparoscopic Gallbladder Surgery: discussed with patient and provided information. Written instructions provided The anatomy & physiology of hepatobiliary & pancreatic function was discussed. The pathophysiology of gallbladder dysfunction was discussed. Natural history risks without surgery was discussed. I feel the risks of no intervention will lead to serious problems that outweigh the operative risks; therefore, I recommended cholecystectomy to remove the pathology. I explained laparoscopic techniques with possible need for an open approach. Probable cholangiogram to evaluate the bilary tract was explained as well.  Risks such as bleeding, infection, abscess, leak, injury to other organs, need for further treatment, heart attack, death, and other risks were discussed. I noted a good likelihood this will help address the problem. Possibility that this will not correct all abdominal symptoms was explained. Goals of post-operative recovery were discussed as well. We will work  to minimize complications. An educational handout further explaining the pathology and treatment options was given as well. Questions were answered. The patient expresses understanding & wishes to proceed with surgery.  Pt Education - [facility] Laparosopic Post Op HCI Pt Education - [facility] Good Bowel Health Pt Education - Laparoscopic Cholecystectomy: gallbladder

## 2017-02-18 NOTE — Discharge Instructions (Signed)
[facility] ______[facility] °LAPAROSCOPIC SURGERY: POST OP INSTRUCTIONS °Always review your discharge instruction sheet given to you by the facility where your surgery was performed. °IF YOU HAVE DISABILITY OR FAMILY LEAVE FORMS, YOU MUST BRING THEM TO THE OFFICE FOR PROCESSING.   °DO NOT GIVE THEM TO YOUR DOCTOR. ° °1. A prescription for pain medication may be given to you upon discharge.  Take your pain medication as prescribed, if needed.  If narcotic pain medicine is not needed, then you may take acetaminophen (Tylenol) or ibuprofen (Advil) as needed. °2. Take your usually prescribed medications unless otherwise directed. °3. If you need a refill on your pain medication, please contact your pharmacy.  They will contact our office to request authorization. Prescriptions will not be filled after 5pm or on week-ends. °4. You should follow a light diet the first few days after arrival home, such as soup and crackers, etc.  Be sure to include lots of fluids daily. °5. Most patients will experience some swelling and bruising in the area of the incisions.  Ice packs will help.  Swelling and bruising can take several days to resolve.  °6. It is common to experience some constipation if taking pain medication after surgery.  Increasing fluid intake and taking a stool softener (such as Colace) will usually help or prevent this problem from occurring.  A mild laxative (Milk of Magnesia or Miralax) should be taken according to package instructions if there are no bowel movements after 48 hours. °7. Unless discharge instructions indicate otherwise, you may remove your bandages 24-48 hours after surgery, and you may shower at that time.  You may have steri-strips (small skin tapes) in place directly over the incision.  These strips should be left on the skin for 7-10 days.  If your surgeon used skin glue on the incision, you may shower in 24 hours.  The glue will flake off over the next 2-3 weeks.  Any sutures or  staples will be removed at the office during your follow-up visit. °8. ACTIVITIES:  You may resume regular (light) daily activities beginning the next day--such as daily self-care, walking, climbing stairs--gradually increasing activities as tolerated.  You may have sexual intercourse when it is comfortable.  Refrain from any heavy lifting or straining until approved by your doctor. °a. You may drive when you are no longer taking prescription pain medication, you can comfortably wear a seatbelt, and you can safely maneuver your car and apply brakes. °b. RETURN TO WORK:  __________________________________________________________ °9. You should see your doctor in the office for a follow-up appointment approximately 2-3 weeks after your surgery.  Make sure that you call for this appointment within a day or two after you arrive home to insure a convenient appointment time. °10. OTHER INSTRUCTIONS: __________________________________________________________________________________________________________________________ __________________________________________________________________________________________________________________________ °WHEN TO CALL YOUR DOCTOR: °1. Fever over 101.0 °2. Inability to urinate °3. Continued bleeding from incision. °4. Increased pain, redness, or drainage from the incision. °5. Increasing abdominal pain ° °The clinic staff is available to answer your questions during regular business hours.  Please don’t hesitate to call and ask to speak to one of the nurses for clinical concerns.  If you have a medical emergency, go to the nearest emergency room or call 911.  A surgeon from [facility] is always on call at the hospital. °[address], [address], [place], [place]  [address] ? [address] [address], [place], [place]   [address] °[phone] ? [phone] ? FAX [phone] °Web site:   [email or web address] °

## 2017-02-18 NOTE — Anesthesia Preprocedure Evaluation (Addendum)
Anesthesia Evaluation  Patient identified by MRN, date of birth, ID band Patient awake    Reviewed: Allergy & Precautions, NPO status , Patient's Chart, lab work & pertinent test results  Airway Mallampati: III  TM Distance: >3 FB Neck ROM: Full    Dental no notable dental hx. 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 CVA, No Residual Symptoms negative psych ROS   GI/Hepatic Neg liver ROS, GERD  Controlled and Medicated,Cholelithiasis- symptomatic   Endo/Other  Obesity  Renal/GU negative Renal ROS  negative genitourinary   Musculoskeletal  (+) Arthritis , Osteoarthritis,    Abdominal (+) + obese,   Peds  Hematology negative hematology ROS (+)   Anesthesia Other Findings   Reproductive/Obstetrics                            Anesthesia Physical Anesthesia Plan  ASA: III  Anesthesia Plan: General   Post-op Pain Management:    Induction: Intravenous, Cricoid pressure planned and Rapid sequence  PONV Risk Score and Plan: 4 or greater and Ondansetron, Dexamethasone, Midazolam, Propofol infusion, Promethazine and Treatment may vary due to age or medical condition  Airway Management Planned: Oral ETT  Additional Equipment:   Intra-op Plan:   Post-operative Plan: Extubation in OR  Informed Consent: I have reviewed the patients History and Physical, chart, labs and discussed the procedure including the risks, benefits and alternatives for the proposed anesthesia with the patient or authorized representative who has indicated his/her  understanding and acceptance.   Dental advisory given  Plan Discussed with: CRNA, Anesthesiologist and Surgeon  Anesthesia Plan Comments:         Anesthesia Quick Evaluation

## 2017-02-18 NOTE — Op Note (Addendum)
Laparoscopic Cholecystectomy with IOC Procedure Note  Indications: This patient presents with symptomatic gallbladder disease and will undergo laparoscopic cholecystectomy. The procedure has been discussed with the patient. Operative and non operative treatments have been discussed. Risks of surgery include bleeding, infection,  Common bile duct injury,  Injury to the stomach,liver, colon,small intestine, abdominal wall,  Diaphragm,  Major blood vessels,  And the need for an open procedure.  Other risks include worsening of medical problems, death,  DVT and pulmonary embolism, and cardiovascular events.   Medical options have also been discussed. The patient has been informed of long term expectations of surgery and non surgical options,  The patient agrees to proceed.  Discussed sleep apnea with the patient and the reasons for observation vs discharge home.  Discussed the risk of both and the risk of discharge home. She does wear he CPAP and I offered admission as well.  The patient understands but would like to be discharged if she feels well.   Pre-operative Diagnosis: Calculus of gallbladder without mention of cholecystitis or obstruction  Post-operative Diagnosis: Same  Surgeon: Carlyn Lemke A.   Assistants: Deon Pilling RNFA   Anesthesia: General endotracheal anesthesia and Local anesthesia 0.25.% bupivacaine, with epinephrine  ASA Class: 3  Procedure Details  The patient was seen again in the Holding Room. The risks, benefits, complications, treatment options, and expected outcomes were discussed with the patient. The possibilities of reaction to medication, pulmonary aspiration, perforation of viscus, bleeding, recurrent infection, finding a normal gallbladder, the need for additional procedures, failure to diagnose a condition, the possible need to convert to an open procedure, and creating a complication requiring transfusion or operation were discussed with the patient. The patient and/or  family concurred with the proposed plan, giving informed consent. The site of surgery properly noted/marked. The patient was taken to Operating Room, identified as Christy Stephenson and the procedure verified as Laparoscopic Cholecystectomy with Intraoperative Cholangiograms. A Time Out was held and the above information confirmed.  Prior to the induction of general anesthesia, antibiotic prophylaxis was administered. General endotracheal anesthesia was then administered and tolerated well. After the induction, the abdomen was prepped in the usual sterile fashion. The patient was positioned in the supine position with the left arm comfortably tucked, along with some reverse Trendelenburg.  Local anesthetic agent was injected into the skin near the umbilicus and an incision made. The midline fascia was incised and the Hasson technique was used to introduce a 12 mm port under direct vision. It was secured with a figure of eight Vicryl suture placed in the usual fashion. Pneumoperitoneum was then created with CO2 and tolerated well without any adverse changes in the patient's vital signs. Additional trocars were introduced under direct vision with an 11 mm trocar in the epigastrium and two  5 mm trocars in the right upper quadrant. All skin incisions were infiltrated with a local anesthetic agent before making the incision and placing the trocars.   The gallbladder was identified, the fundus grasped and retracted cephalad. Adhesions were lysed bluntly and with the electrocautery where indicated, taking care not to injure any adjacent organs or viscus. The infundibulum was grasped and retracted laterally, exposing the peritoneum overlying the triangle of Calot. This was then divided and exposed in a blunt fashion. The cystic duct was clearly identified and bluntly dissected circumferentially. The junctions of the gallbladder, cystic duct and common bile duct were clearly identified prior to the division of any linear  structure.   An incision was made  in the cystic duct and the cholangiogram catheter introduced. The catheter was secured using an endoclip. The study showed no stones and good visualization of the distal and proximal biliary tree. The catheter was then removed.   The cystic duct was then  ligated with surgical clips  on the patient side and  clipped on the gallbladder side and divided. The cystic artery was identified, dissected free, ligated with clips and divided as well. Posterior cystic artery clipped and divided.  The gallbladder was dissected from the liver bed in retrograde fashion with the electrocautery. The gallbladder was removed. The liver bed was irrigated and inspected. Hemostasis was achieved with the electrocautery. Copious irrigation was utilized and was repeatedly aspirated until clear all particulate matter. Hemostasis was achieved with no signs of  bleeding or bile leakage.  Pneumoperitoneum was completely reduced after viewing removal of the trocars under direct vision. The wound was thoroughly irrigated and the fascia was then closed with a figure of eight suture; the skin was then closed with 4 0 MONOCRYL and a sterile dressing was applied.  Instrument, sponge, and needle counts were correct at closure and at the conclusion of the case.   Findings: Cholecystitis with Cholelithiasis  Estimated Blood Loss: less than 50 mL         Drains: none          Total IV Fluids: per record         Specimens: Gallbladder           Complications: None; patient tolerated the procedure well.         Disposition: PACU - hemodynamically stable.         Condition: stable

## 2017-02-18 NOTE — Transfer of Care (Signed)
Immediate Anesthesia Transfer of Care Note  Patient: Christy Stephenson  Procedure(s) Performed: LAPAROSCOPIC CHOLECYSTECTOMY WITH INTRAOPERATIVE CHOLANGIOGRAM (N/A Abdomen)  Patient Location: PACU  Anesthesia Type:General  Level of Consciousness: patient cooperative and responds to stimulation  Airway & Oxygen Therapy: Patient Spontanous Breathing and Patient connected to nasal cannula oxygen  Post-op Assessment: Report given to RN and Post -op Vital signs reviewed and stable  Post vital signs: Reviewed and stable  Last Vitals:  Vitals:   02/18/17 0810 02/18/17 1153  BP: 115/64 (!) 146/65  Pulse: 66 78  Resp: 18 (P) 13  Temp: 36.8 C (P) 36.4 C  SpO2: 96% 96%    Last Pain:  Vitals:   02/18/17 0810  TempSrc: Oral         Complications: No apparent anesthesia complications

## 2017-02-19 ENCOUNTER — Encounter (HOSPITAL_COMMUNITY): Payer: Self-pay | Admitting: Surgery

## 2017-02-25 DIAGNOSIS — G4733 Obstructive sleep apnea (adult) (pediatric): Secondary | ICD-10-CM | POA: Diagnosis not present

## 2017-02-25 DIAGNOSIS — Z1389 Encounter for screening for other disorder: Secondary | ICD-10-CM | POA: Diagnosis not present

## 2017-02-25 DIAGNOSIS — E785 Hyperlipidemia, unspecified: Secondary | ICD-10-CM | POA: Diagnosis not present

## 2017-02-25 DIAGNOSIS — R197 Diarrhea, unspecified: Secondary | ICD-10-CM | POA: Diagnosis not present

## 2017-02-25 DIAGNOSIS — M85851 Other specified disorders of bone density and structure, right thigh: Secondary | ICD-10-CM | POA: Diagnosis not present

## 2017-02-25 DIAGNOSIS — K219 Gastro-esophageal reflux disease without esophagitis: Secondary | ICD-10-CM | POA: Diagnosis not present

## 2017-02-25 DIAGNOSIS — Z0000 Encounter for general adult medical examination without abnormal findings: Secondary | ICD-10-CM | POA: Diagnosis not present

## 2017-02-25 DIAGNOSIS — Z23 Encounter for immunization: Secondary | ICD-10-CM | POA: Diagnosis not present

## 2017-02-25 DIAGNOSIS — G894 Chronic pain syndrome: Secondary | ICD-10-CM | POA: Diagnosis not present

## 2017-02-25 DIAGNOSIS — M1711 Unilateral primary osteoarthritis, right knee: Secondary | ICD-10-CM | POA: Diagnosis not present

## 2017-03-06 DIAGNOSIS — M1711 Unilateral primary osteoarthritis, right knee: Secondary | ICD-10-CM | POA: Diagnosis not present

## 2017-03-13 DIAGNOSIS — M1711 Unilateral primary osteoarthritis, right knee: Secondary | ICD-10-CM | POA: Diagnosis not present

## 2017-08-29 DIAGNOSIS — R197 Diarrhea, unspecified: Secondary | ICD-10-CM | POA: Diagnosis not present

## 2017-09-02 DIAGNOSIS — R197 Diarrhea, unspecified: Secondary | ICD-10-CM | POA: Diagnosis not present

## 2017-09-26 DIAGNOSIS — N812 Incomplete uterovaginal prolapse: Secondary | ICD-10-CM | POA: Diagnosis not present

## 2017-09-26 DIAGNOSIS — K219 Gastro-esophageal reflux disease without esophagitis: Secondary | ICD-10-CM | POA: Diagnosis not present

## 2017-09-26 DIAGNOSIS — K9089 Other intestinal malabsorption: Secondary | ICD-10-CM | POA: Diagnosis not present

## 2017-11-24 DIAGNOSIS — G4733 Obstructive sleep apnea (adult) (pediatric): Secondary | ICD-10-CM | POA: Diagnosis not present

## 2017-11-24 DIAGNOSIS — Z6836 Body mass index (BMI) 36.0-36.9, adult: Secondary | ICD-10-CM | POA: Diagnosis not present

## 2017-11-24 DIAGNOSIS — M1990 Unspecified osteoarthritis, unspecified site: Secondary | ICD-10-CM | POA: Diagnosis not present

## 2017-11-24 DIAGNOSIS — Z5181 Encounter for therapeutic drug level monitoring: Secondary | ICD-10-CM | POA: Diagnosis not present

## 2017-11-24 DIAGNOSIS — E663 Overweight: Secondary | ICD-10-CM | POA: Diagnosis not present

## 2017-12-16 DIAGNOSIS — S0502XA Injury of conjunctiva and corneal abrasion without foreign body, left eye, initial encounter: Secondary | ICD-10-CM | POA: Diagnosis not present

## 2018-02-05 DIAGNOSIS — G4733 Obstructive sleep apnea (adult) (pediatric): Secondary | ICD-10-CM | POA: Diagnosis not present

## 2018-02-11 DIAGNOSIS — N8189 Other female genital prolapse: Secondary | ICD-10-CM | POA: Diagnosis not present

## 2018-02-11 DIAGNOSIS — Z6837 Body mass index (BMI) 37.0-37.9, adult: Secondary | ICD-10-CM | POA: Diagnosis not present

## 2018-02-11 DIAGNOSIS — Z01419 Encounter for gynecological examination (general) (routine) without abnormal findings: Secondary | ICD-10-CM | POA: Diagnosis not present

## 2018-02-11 DIAGNOSIS — Z1231 Encounter for screening mammogram for malignant neoplasm of breast: Secondary | ICD-10-CM | POA: Diagnosis not present

## 2018-02-19 IMAGING — MR MR ABDOMEN WO/W CM MRCP
21 of 22 series · 46 of 48 positions shown · IV contrast (multihance)
Comparison: Ultrasound 01/24/2017

CLINICAL DATA: Abnormal ultrasound. Dilated common bile duct. RIGHT
upper quadrant pain for

EXAM:
MRI ABDOMEN WITHOUT AND WITH CONTRAST (INCLUDING MRCP)
TECHNIQUE: Multiplanar multisequence MR imaging of the abdomen was performed
both before and after the administration of intravenous contrast.
Heavily T2-weighted images of the biliary and pancreatic ducts were
obtained, and three-dimensional MRCP images were rendered by post
processing.
CONTRAST:  20mL MULTIHANCE GADOBENATE DIMEGLUMINE 529 MG/ML IV SOLN

[Series 4: T2 fat-sat · axial · 5.0mm · 0.78mm/px · 1 of 48 slices shown]
[im 1/48]
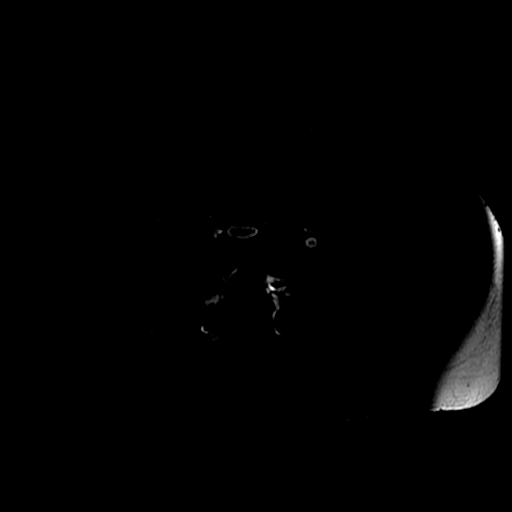

[Series 6: MRCP · coronal · 2.0mm · 0.70mm/px · 1 of 39 slices shown (1 of 2)]
[im 1/39]
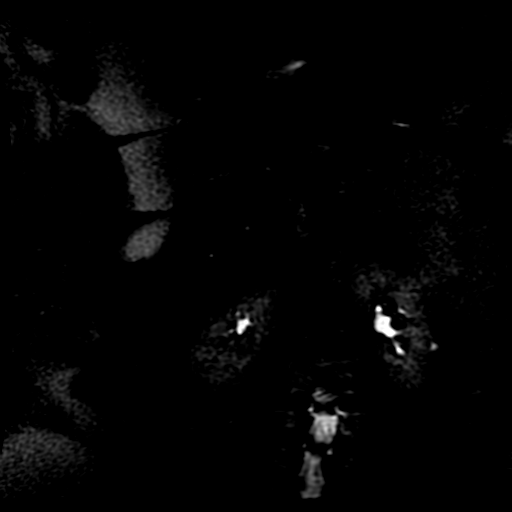

[Series 7: DWI b500 · axial · 6.0mm · 1.48mm/px · 1 of 66 slices shown]
[im 1/66]
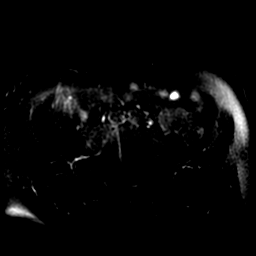

[Series 10: ax dualecho · axial · 5.0mm · 0.78mm/px · z∈[-29,+191]mm · 2 of 90 slices shown]
[im 1/90]
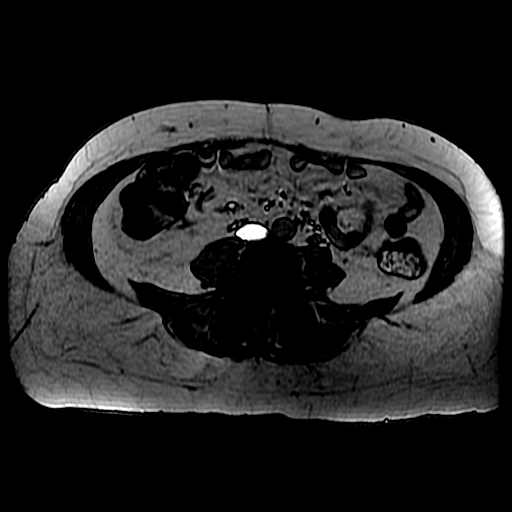
[im 90/90]
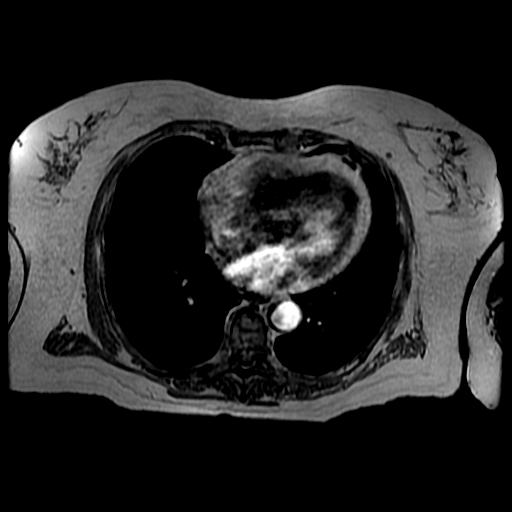

[Series 11: MRCP · coronal · 40.0mm · 0.70mm/px · 1 of 7 slices shown (2 of 2)]
[im 1/7]
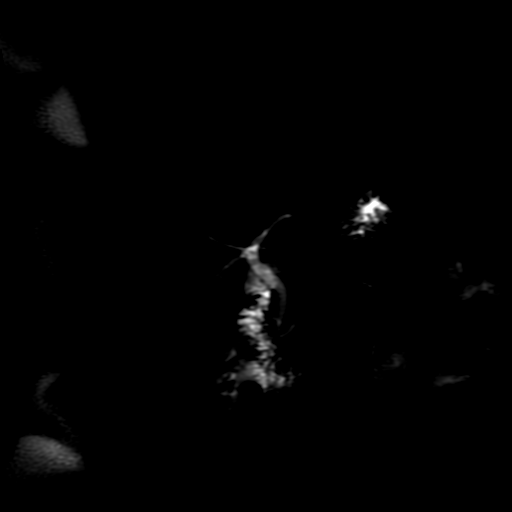

[Series 12: bSSFP fat-sat · coronal · 5.0mm · 1.41mm/px · 1 of 39 slices shown]
[im 1/39]
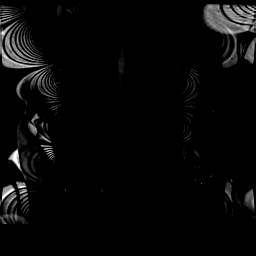

[Series 13: T2 · axial · 5.0mm · 0.78mm/px · 1 of 45 slices shown]
[im 1/45]
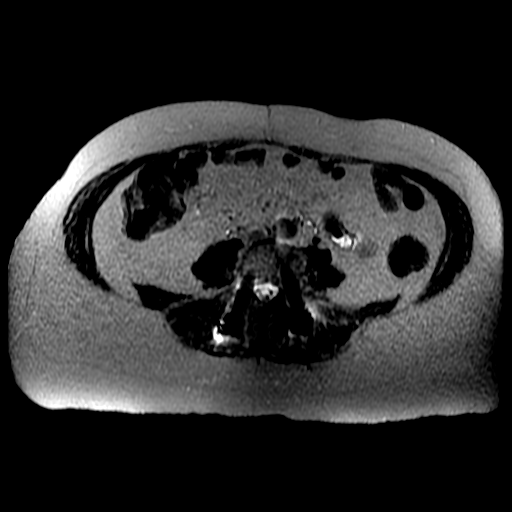

[Series 15: T1 dynamic · coronal · delayed · 4.0mm · 1.56mm/px · 3 of 96 slices shown (1 of 7)]
[im 1/96]
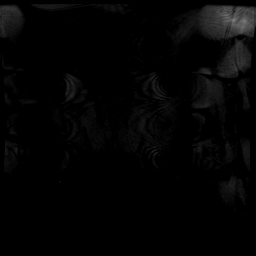
[im 48/96]
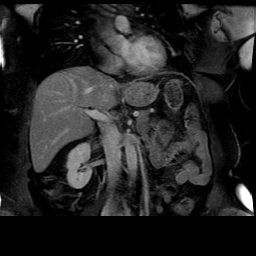
[im 96/96]
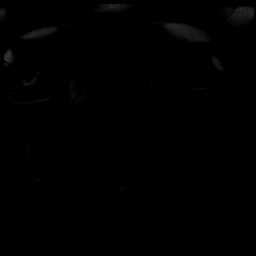

[Series 500: reformatted · sagittal · 100.0mm · 0.51mm/px · 1 of 26 slices shown]
[im 1/26]
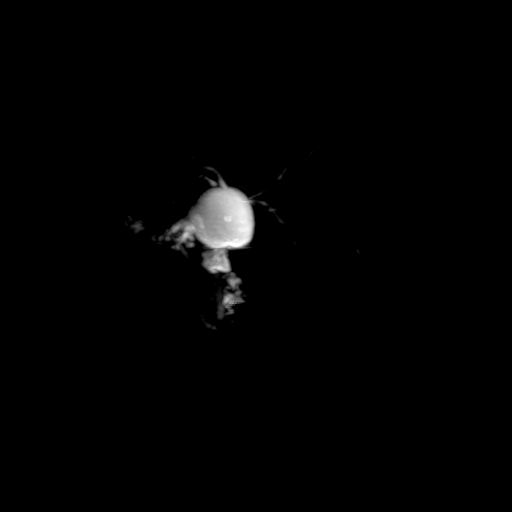

[Series 700: DWI · axial · 6.0mm · 1.48mm/px · 1 of 33 slices shown]
[im 1/33]
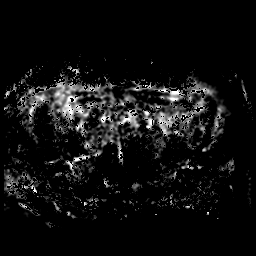

[Series 1400: T1 dynamic · axial · 5.0mm · 1.56mm/px · z∈[-28,+190]mm · 3 of 88 slices shown (2 of 7)]
[im 1/88]
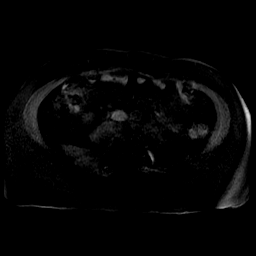
[im 44/88]
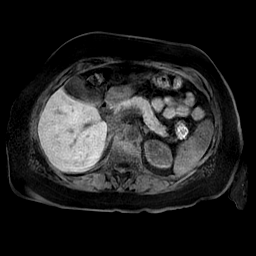
[im 88/88]
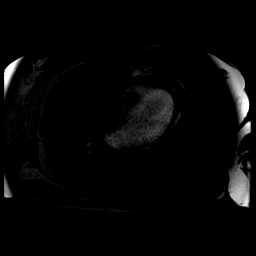

[Series 1401: T1 dynamic · axial · 5.0mm · 1.56mm/px · z∈[-28,+190]mm · 3 of 88 slices shown (3 of 7)]
[im 1/88]
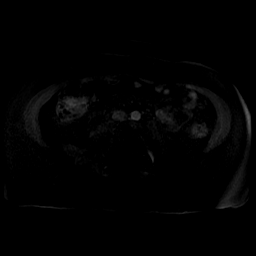
[im 44/88]
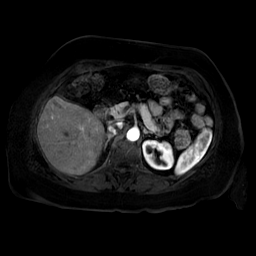
[im 88/88]
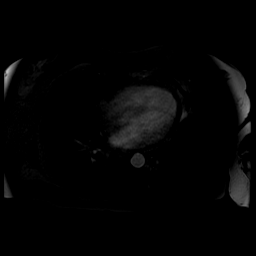

[Series 1402: T1 dynamic · axial · 5.0mm · 1.56mm/px · z∈[-28,+190]mm · 3 of 88 slices shown (4 of 7)]
[im 1/88]
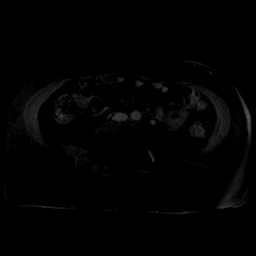
[im 44/88]
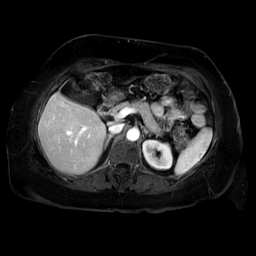
[im 88/88]
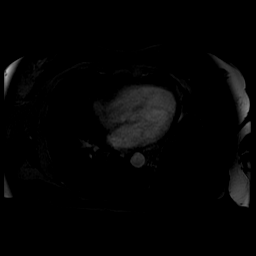

[Series 1403: T1 dynamic · axial · 5.0mm · 1.56mm/px · z∈[-28,+190]mm · 3 of 88 slices shown (5 of 7)]
[im 1/88]
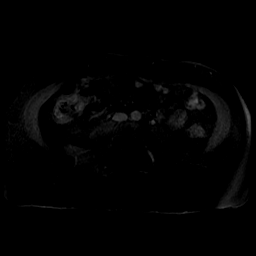
[im 44/88]
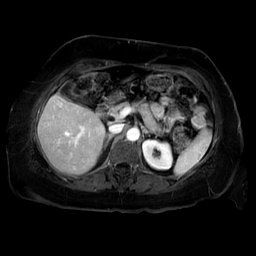
[im 88/88]
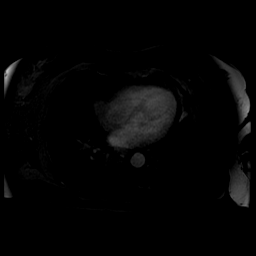

[Series 1404: T1 dynamic · axial · 5.0mm · 1.56mm/px · z∈[-28,+190]mm · 3 of 88 slices shown (6 of 7)]
[im 1/88]
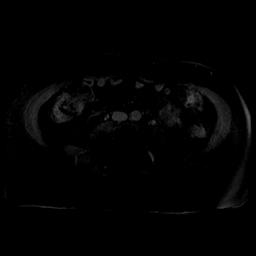
[im 44/88]
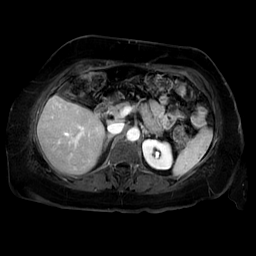
[im 88/88]
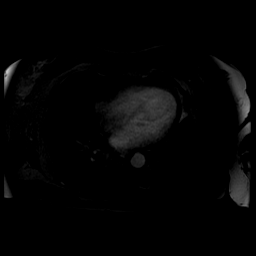

[Series 1405: T1 dynamic · axial · 5.0mm · 1.56mm/px · z∈[-28,+190]mm · 3 of 88 slices shown (7 of 7)]
[im 1/88]
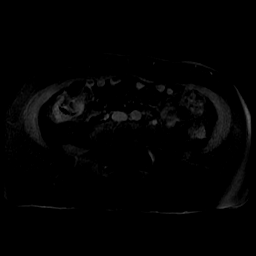
[im 44/88]
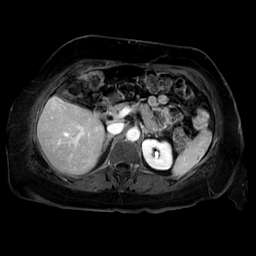
[im 88/88]
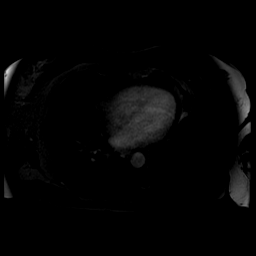

[((id)/(id)/1)-((id)/(id)/1) · axial · 5.0mm · 1.56mm/px · z∈[-28,+190]mm · 3 of 88 slices shown (1 of 5)]
[im 1/88]
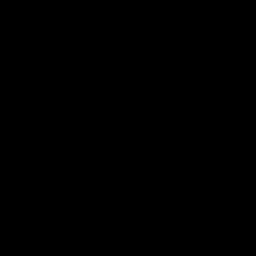
[im 44/88]
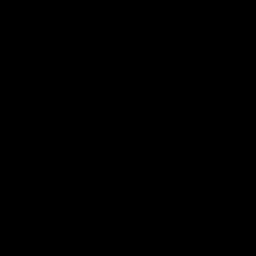
[im 88/88]
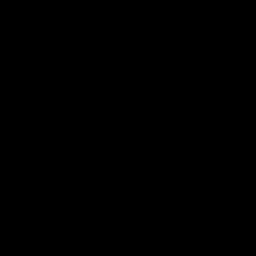

[((id)/(id)/1)-((id)/(id)/1) · axial · 5.0mm · 1.56mm/px · z∈[-28,+190]mm · 3 of 88 slices shown (2 of 5)]
[im 1/88]
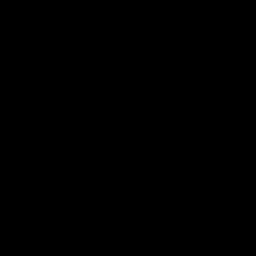
[im 44/88]
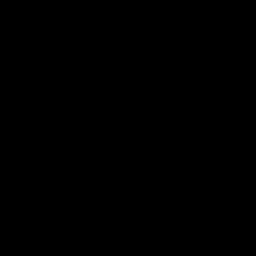
[im 88/88]
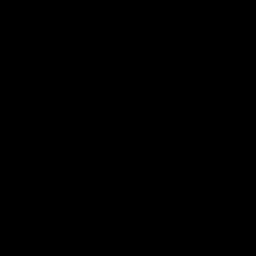

[((id)/(id)/1)-((id)/(id)/1) · axial · 5.0mm · 1.56mm/px · z∈[-28,+190]mm · 3 of 88 slices shown (3 of 5)]
[im 1/88]
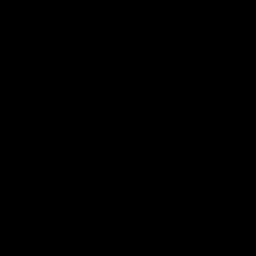
[im 44/88]
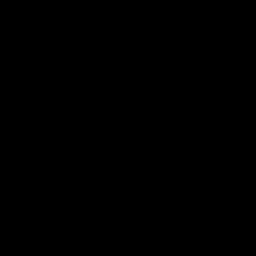
[im 88/88]
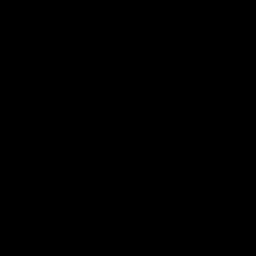

[((id)/(id)/1)-((id)/(id)/1) · axial · 5.0mm · 1.56mm/px · z∈[-28,+190]mm · 3 of 88 slices shown (4 of 5)]
[im 1/88]
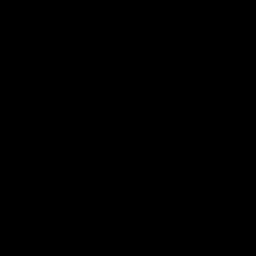
[im 44/88]
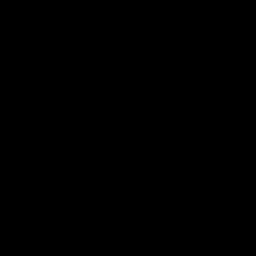
[im 88/88]
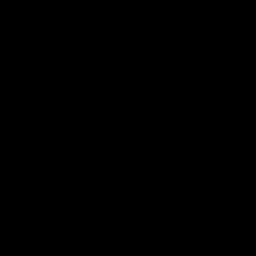

[((id)/(id)/1)-((id)/(id)/1) · axial · 5.0mm · 1.56mm/px · z∈[-28,+190]mm · 3 of 88 slices shown (5 of 5)]
[im 1/88]
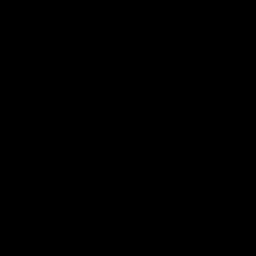
[im 44/88]
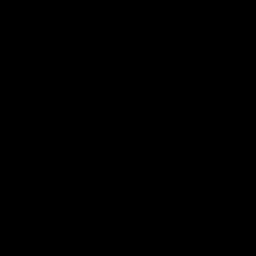
[im 88/88]
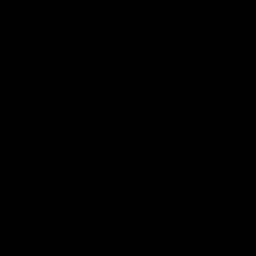

[46 of 48 positions shown; findings below may reference images not displayed]

FINDINGS: Lower chest:  Lung bases are clear.

Hepatobiliary: The common hepatic duct measures 7 mm. The common
bile duct through the pancreatic head measures 7 mm. This mild
dilatation is fusiform without obstructing lesion. No filling
defects within the common bile duct. The common bile duct tapers
normally to the ampulla. No pancreatic duct dilatation.

There is no intrahepatic duct dilatation. Gallbladder is normal
without evidence of gallstones.

There is no focal hepatic lesion.  No enhancing hepatic lesion.

Pancreas: Normal pancreatic parenchymal intensity. No ductal
dilatation or inflammation.

Spleen: Normal spleen.

Adrenals/urinary tract: Adrenal glands and kidneys are normal.

Stomach/Bowel: Stomach and limited of the small bowel is
unremarkable

Vascular/Lymphatic: Abdominal aortic normal caliber. No
retroperitoneal periportal lymphadenopathy.

Musculoskeletal: No aggressive osseous lesion
IMPRESSION: 1. Minimal dilatation of the common bile duct is favored to
represent benign senescent dilatation. No obstructing lesion.
2. Normal gallbladder.
3. Normal liver parenchyma.
4. Normal pancreas.

## 2018-02-26 DIAGNOSIS — M1990 Unspecified osteoarthritis, unspecified site: Secondary | ICD-10-CM | POA: Diagnosis not present

## 2018-02-26 DIAGNOSIS — E7800 Pure hypercholesterolemia, unspecified: Secondary | ICD-10-CM | POA: Diagnosis not present

## 2018-02-26 DIAGNOSIS — Z0000 Encounter for general adult medical examination without abnormal findings: Secondary | ICD-10-CM | POA: Diagnosis not present

## 2018-02-26 DIAGNOSIS — R05 Cough: Secondary | ICD-10-CM | POA: Diagnosis not present

## 2018-02-26 DIAGNOSIS — Z23 Encounter for immunization: Secondary | ICD-10-CM | POA: Diagnosis not present

## 2018-02-26 DIAGNOSIS — Z1389 Encounter for screening for other disorder: Secondary | ICD-10-CM | POA: Diagnosis not present

## 2018-02-26 DIAGNOSIS — E663 Overweight: Secondary | ICD-10-CM | POA: Diagnosis not present

## 2018-03-12 DIAGNOSIS — S0502XA Injury of conjunctiva and corneal abrasion without foreign body, left eye, initial encounter: Secondary | ICD-10-CM | POA: Diagnosis not present

## 2018-03-14 DIAGNOSIS — S0502XD Injury of conjunctiva and corneal abrasion without foreign body, left eye, subsequent encounter: Secondary | ICD-10-CM | POA: Diagnosis not present

## 2018-06-15 DIAGNOSIS — N8189 Other female genital prolapse: Secondary | ICD-10-CM | POA: Diagnosis not present

## 2018-06-15 DIAGNOSIS — N816 Rectocele: Secondary | ICD-10-CM | POA: Diagnosis not present

## 2018-06-15 DIAGNOSIS — N814 Uterovaginal prolapse, unspecified: Secondary | ICD-10-CM | POA: Diagnosis not present

## 2018-09-21 DIAGNOSIS — M25511 Pain in right shoulder: Secondary | ICD-10-CM | POA: Diagnosis not present

## 2018-10-02 DIAGNOSIS — Z981 Arthrodesis status: Secondary | ICD-10-CM | POA: Diagnosis not present

## 2018-10-02 DIAGNOSIS — M545 Low back pain: Secondary | ICD-10-CM | POA: Diagnosis not present

## 2018-10-02 DIAGNOSIS — M47816 Spondylosis without myelopathy or radiculopathy, lumbar region: Secondary | ICD-10-CM | POA: Diagnosis not present

## 2018-10-05 LAB — HM DIABETES EYE EXAM

## 2018-10-07 ENCOUNTER — Other Ambulatory Visit: Payer: Self-pay | Admitting: Neurological Surgery

## 2018-10-07 DIAGNOSIS — M545 Low back pain, unspecified: Secondary | ICD-10-CM

## 2018-10-08 DIAGNOSIS — N950 Postmenopausal bleeding: Secondary | ICD-10-CM | POA: Diagnosis not present

## 2018-10-08 DIAGNOSIS — N858 Other specified noninflammatory disorders of uterus: Secondary | ICD-10-CM | POA: Diagnosis not present

## 2018-10-14 ENCOUNTER — Other Ambulatory Visit: Payer: Self-pay

## 2018-10-14 ENCOUNTER — Ambulatory Visit
Admission: RE | Admit: 2018-10-14 | Discharge: 2018-10-14 | Disposition: A | Discharge status: Home / Self Care | Payer: Medicare Other | Source: Ambulatory Visit | Attending: Neurological Surgery | Admitting: Neurological Surgery

## 2018-10-14 DIAGNOSIS — M545 Low back pain, unspecified: Secondary | ICD-10-CM

## 2018-10-16 DIAGNOSIS — S32009K Unspecified fracture of unspecified lumbar vertebra, subsequent encounter for fracture with nonunion: Secondary | ICD-10-CM | POA: Diagnosis not present

## 2018-11-03 DIAGNOSIS — H04122 Dry eye syndrome of left lacrimal gland: Secondary | ICD-10-CM | POA: Diagnosis not present

## 2018-11-11 DIAGNOSIS — Z6836 Body mass index (BMI) 36.0-36.9, adult: Secondary | ICD-10-CM | POA: Diagnosis not present

## 2018-11-11 DIAGNOSIS — S32009K Unspecified fracture of unspecified lumbar vertebra, subsequent encounter for fracture with nonunion: Secondary | ICD-10-CM | POA: Diagnosis not present

## 2018-12-02 DIAGNOSIS — L821 Other seborrheic keratosis: Secondary | ICD-10-CM | POA: Diagnosis not present

## 2019-01-28 DIAGNOSIS — M25511 Pain in right shoulder: Secondary | ICD-10-CM | POA: Diagnosis not present

## 2019-02-04 DIAGNOSIS — G4733 Obstructive sleep apnea (adult) (pediatric): Secondary | ICD-10-CM | POA: Diagnosis not present

## 2019-03-04 ENCOUNTER — Other Ambulatory Visit: Payer: Self-pay | Admitting: Internal Medicine

## 2019-03-04 DIAGNOSIS — E7800 Pure hypercholesterolemia, unspecified: Secondary | ICD-10-CM | POA: Diagnosis not present

## 2019-03-04 DIAGNOSIS — Z0000 Encounter for general adult medical examination without abnormal findings: Secondary | ICD-10-CM | POA: Diagnosis not present

## 2019-03-04 DIAGNOSIS — Z1389 Encounter for screening for other disorder: Secondary | ICD-10-CM | POA: Diagnosis not present

## 2019-03-04 DIAGNOSIS — Z23 Encounter for immunization: Secondary | ICD-10-CM | POA: Diagnosis not present

## 2019-03-04 DIAGNOSIS — R413 Other amnesia: Secondary | ICD-10-CM | POA: Diagnosis not present

## 2019-03-04 DIAGNOSIS — M8588 Other specified disorders of bone density and structure, other site: Secondary | ICD-10-CM | POA: Diagnosis not present

## 2019-03-04 DIAGNOSIS — M8580 Other specified disorders of bone density and structure, unspecified site: Secondary | ICD-10-CM

## 2019-03-04 DIAGNOSIS — G4733 Obstructive sleep apnea (adult) (pediatric): Secondary | ICD-10-CM | POA: Diagnosis not present

## 2019-04-28 DIAGNOSIS — M25511 Pain in right shoulder: Secondary | ICD-10-CM | POA: Diagnosis not present

## 2019-05-17 DIAGNOSIS — E785 Hyperlipidemia, unspecified: Secondary | ICD-10-CM | POA: Diagnosis not present

## 2019-05-17 DIAGNOSIS — E7800 Pure hypercholesterolemia, unspecified: Secondary | ICD-10-CM | POA: Diagnosis not present

## 2019-05-17 DIAGNOSIS — M1990 Unspecified osteoarthritis, unspecified site: Secondary | ICD-10-CM | POA: Diagnosis not present

## 2019-05-17 DIAGNOSIS — J45909 Unspecified asthma, uncomplicated: Secondary | ICD-10-CM | POA: Diagnosis not present

## 2019-05-28 ENCOUNTER — Other Ambulatory Visit: Payer: Medicare Other

## 2019-06-29 DIAGNOSIS — M1711 Unilateral primary osteoarthritis, right knee: Secondary | ICD-10-CM | POA: Diagnosis not present

## 2019-07-06 DIAGNOSIS — M1711 Unilateral primary osteoarthritis, right knee: Secondary | ICD-10-CM | POA: Diagnosis not present

## 2019-07-13 DIAGNOSIS — M1711 Unilateral primary osteoarthritis, right knee: Secondary | ICD-10-CM | POA: Diagnosis not present

## 2019-08-10 DIAGNOSIS — Z01419 Encounter for gynecological examination (general) (routine) without abnormal findings: Secondary | ICD-10-CM | POA: Diagnosis not present

## 2019-08-10 DIAGNOSIS — Z6838 Body mass index (BMI) 38.0-38.9, adult: Secondary | ICD-10-CM | POA: Diagnosis not present

## 2019-08-10 DIAGNOSIS — Z1231 Encounter for screening mammogram for malignant neoplasm of breast: Secondary | ICD-10-CM | POA: Diagnosis not present

## 2019-08-23 DIAGNOSIS — Z23 Encounter for immunization: Secondary | ICD-10-CM | POA: Diagnosis not present

## 2019-09-20 DIAGNOSIS — Z23 Encounter for immunization: Secondary | ICD-10-CM | POA: Diagnosis not present

## 2019-10-15 DIAGNOSIS — M1711 Unilateral primary osteoarthritis, right knee: Secondary | ICD-10-CM | POA: Diagnosis not present

## 2019-10-25 ENCOUNTER — Ambulatory Visit (INDEPENDENT_AMBULATORY_CARE_PROVIDER_SITE_OTHER): Payer: Medicare Other | Admitting: Internal Medicine

## 2019-10-25 ENCOUNTER — Encounter: Payer: Self-pay | Admitting: Internal Medicine

## 2019-10-25 ENCOUNTER — Other Ambulatory Visit: Payer: Self-pay

## 2019-10-25 ENCOUNTER — Ambulatory Visit (INDEPENDENT_AMBULATORY_CARE_PROVIDER_SITE_OTHER): Payer: Medicare Other

## 2019-10-25 VITALS — BP 122/80 | HR 71 | Temp 97.1°F | Ht 64.0 in | Wt 220.2 lb

## 2019-10-25 DIAGNOSIS — J45909 Unspecified asthma, uncomplicated: Secondary | ICD-10-CM

## 2019-10-25 DIAGNOSIS — J4520 Mild intermittent asthma, uncomplicated: Secondary | ICD-10-CM | POA: Diagnosis not present

## 2019-10-25 DIAGNOSIS — IMO0001 Reserved for inherently not codable concepts without codable children: Secondary | ICD-10-CM

## 2019-10-25 DIAGNOSIS — J8410 Pulmonary fibrosis, unspecified: Secondary | ICD-10-CM | POA: Diagnosis not present

## 2019-10-25 DIAGNOSIS — G4733 Obstructive sleep apnea (adult) (pediatric): Secondary | ICD-10-CM

## 2019-10-25 MED ORDER — TRELEGY ELLIPTA 100-62.5-25 MCG/INH IN AEPB
1.0000 | INHALATION_SPRAY | Freq: Every day | RESPIRATORY_TRACT | 0 refills | Status: DC
Start: 2019-10-25 — End: 2019-11-15

## 2019-10-25 MED ORDER — TRELEGY ELLIPTA 100-62.5-25 MCG/INH IN AEPB
1.0000 | INHALATION_SPRAY | Freq: Every day | RESPIRATORY_TRACT | 0 refills | Status: DC
Start: 2019-10-25 — End: 2019-10-25

## 2019-10-25 NOTE — Patient Instructions (Signed)
Order- CXR    Dx asthma moderate intermittent  Sample x 2 Trelegy 100 inhaler  Inhale 1 puff then rinse mouth, once daily. See if this helps with wheeze and shortness of breath.   Please call if we can help

## 2019-10-25 NOTE — Progress Notes (Signed)
HPI F never smoker (+ second hand), retired Therapist, sports, with hx Asthma, Emphysema, Bronchiectasis (on CT), complicated by .Granulomatous Lung Disease, OSA (Dr Maxwell Caul), Polyneuropathy, Transient Global Amnesia, Hyperlipidemia, PFT [facility] 07/23/12- mild obstructive airways disease with response to bronchodilator. Normal lung volumes, normal diffusion. FVC 2.36/79%, FEV1 1.89/83%, FEV1/FVC 0.80, FEF 25-75% 1.31/69%. TLC 96%, DLCO 95%. CT angiogram chest chest [facility] 08/04/2012: Prior granulomatous disease. 5 mm none, supplied nodule right mid lung. Bilateral lower lobe bronchiectatic change and underlying emphysema. Hiatal hernia. Fatty liver. Quant AFB assay 10/15/12- NEG ------------------------------------------------------------------------------------------ 03/25/13- 71yoF never smoker, lung nodules, emphysema-based on Ct Chest 08-04-12; Mild obstructive/ asthma. Husband here. FOLLOWS FOR: Pt states she has been doing well since last visit; Denies any troubles with SOB, wheezing, cough, or congesiton. She feels well and denies fever, night sweats, significant cough or swollen glands. Quant AFB assay 10/15/12- NEG CT chest [facility] 03/24/13- prior granulomatous disease. Stable right middle lobe nodule which likely represents a benign calcified granuloma   10/25/19- 78 yoF never smoker (+ second hand) with hx Asthma, Emphysema (on CT) coming to re-establish as new patient after last seen here in 2014. Medical problem list includes Granulomatous Lung Disease, OSA (Dr Maxwell Caul), Polyneuropathy, Transient Global Amnesia, Hyperlipidemia,  , Flonase, Pending TKR (Dr Wynelle Link) -----OSA, Granulomatous, more wheezing and SOB with exertion Had 2 Phizer Covax. Over the past year noting increased SOB and wheeze with exertion/ sustained walking. OTC Primatene helps. Denies cough. Wheeze noted daily when she lies down. Occ scant yellow sputum. No seasonal or exposure pattern noted.  Uses chair lift at home,  pending L TKR- does not climb stairs.  Never DVT. Denies sinus problems. Aware of LPR easily with eating/ drinking, but not active reflux.   Prior to Admission medications   Medication Sig Start Date End Date Taking? Authorizing Provider  Ascorbic Acid (VITAMIN C) 1000 MG tablet Take 1,000 mg by mouth daily.    [provider]  aspirin EC 81 MG tablet Take 81 mg by mouth daily.    [provider]  CALCIUM-VITAMIN D PO Take 1 tablet by mouth 2 (two) times daily.    [provider]  Carboxymethylcellulose Sodium (EQ RESTORE TEARS OP) Place 1 drop into both eyes 3 (three) times daily.    [provider]  Cholecalciferol (VITAMIN D3) 2000 units TABS Take 2,000 Units by mouth daily.    [provider]  diclofenac (VOLTAREN) 75 MG EC tablet Take 75 mg by mouth 2 (two) times daily.     [provider]  diclofenac Sodium (VOLTAREN) 1 % GEL diclofenac 1 % topical gel  APPLY 2 GRAMS TO AFFECTED AREA FOUR TIMES DAILY AS NEEDED FOR PAIN    [provider]  Diethylpropion HCl CR 75 MG TB24 diethylpropion ER 75 mg tablet,extended release  TAKE 1 TABLET BY MOUTH ONCE DAILY    [provider]  esomeprazole (NEXIUM 24HR) 20 MG capsule Take 20 mg by mouth daily before breakfast.    [provider]  estradiol (ESTRACE) 1 MG tablet Take 1 mg by mouth daily.    [provider]  fluticasone (FLONASE) 50 MCG/ACT nasal spray Place 2 sprays into the nose daily. Patient not taking: Reported on 02/17/2017 10/22/12   Baird Lyons D, MD  Fluticasone-Umeclidin-Vilant (TRELEGY ELLIPTA) 100-62.5-25 MCG/INH AEPB Inhale 1 puff into the lungs daily. 10/25/19   Deneise Lever, MD  gabapentin (NEURONTIN) 600 MG tablet Take 600 mg by mouth 3 (three) times daily.  [provider]  ketorolac (ACULAR) 0.5 % ophthalmic solution ketorolac 0.5 % eye drops    [provider]  Multiple Vitamin (MULTIVITAMIN WITH MINERALS) TABS  Take 1 tablet by mouth daily.    [provider]  omeprazole (PRILOSEC) 20 MG capsule Take by mouth.    [provider]  OVER THE COUNTER MEDICATION Take 1 capsule by mouth 2 (two) times daily. OTC Leg Veins    [provider]  oxyCODONE (OXY IR/ROXICODONE) 5 MG immediate release tablet Take 1-2 tablets (5-10 mg total) by mouth every 6 (six) hours as needed for severe pain. 02/18/17   Cornett, [name], MD  progesterone (PROMETRIUM) 100 MG capsule Take 100 mg by mouth at bedtime.    [provider]  progesterone (PROMETRIUM) 100 MG capsule progesterone micronized 100 mg capsule  TAKE 1 CAPSULE BY MOUTH AT BEDTIME    [provider]  tiotropium (SPIRIVA HANDIHALER) 18 MCG inhalation capsule PLACE ONE CAPSULE INTO INHALER AND INHALE DAILY Patient not taking: Reported on 02/17/2017 05/16/14   Deneise Lever, MD  trimethoprim-polymyxin b (POLYTRIM) ophthalmic solution polymyxin B sulfate 10,000 unit-trimethoprim 1 mg/mL eye drops    [provider]  vitamin E 400 UNIT capsule Take 400 Units by mouth daily.    [provider]   Past Medical History:  Diagnosis Date  . Arthritis    osteo  . Asthma   . Asthma   . 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 GERD (gastroesophageal reflux disease)   . Hypercholesteremia   . Neuropathy    feet  . Obesity   . Obstructive sleep apnea 07/09/2012   CPAP   . Peripheral neuropathy   . Pneumonia    as a child  . Shortness of breath    with exertion  . Stroke ([place])   . TIA (transient ischemic attack)   . TIA (transient ischemic attack) 2014   no residual - no follow up needed  . 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   Past Surgical History:  Procedure Laterality Date  . BREAST CYST EXCISION    . CARDIAC CATHETERIZATION  07/09/2012  . cataracts Bilateral 2013   this was second one  . CHOLECYSTECTOMY N/A 02/18/2017   Procedure: LAPAROSCOPIC CHOLECYSTECTOMY WITH INTRAOPERATIVE CHOLANGIOGRAM;  Surgeon:  Erroll Luna, MD;  Location: [facility];  Service: General;  Laterality: N/A;  . COLONOSCOPY    . DILATION AND CURETTAGE OF UTERUS    . FINGER SURGERY Right 1971   ring finger repair  . KNEE SURGERY Right    arthroscopy  . LEFT HEART CATHETERIZATION WITH CORONARY ANGIOGRAM N/A 07/09/2012   Procedure: LEFT HEART CATHETERIZATION WITH CORONARY ANGIOGRAM;  Surgeon: Sinclair Grooms, MD;  Location: [facility] CATH LAB;  Service: Cardiovascular;  Laterality: N/A;  . TONSILLECTOMY    . TUBAL LIGATION     Family History  Problem Relation Age of Onset  . Diabetes Mellitus II Mother   . CVA Father   . Heart attack Father   . Lung cancer Father   . Tuberculosis Sister        bovine   Social History   Socioeconomic History  . Marital status: Married    Spouse name: Not on file  . Number of children: 2  . Years of education: AD  . Highest education level: Not on file  Occupational History  . Occupation: retired    Fish farm manager: [employer]    Comment: AD Nursing  Tobacco Use  .  Smoking status: Never Smoker  . Smokeless tobacco: Never Used  . Tobacco comment: Had tried when younger  Substance and Sexual Activity  . Alcohol use: No  . Drug use: No  . Sexual activity: Yes  Other Topics Concern  . Not on file  Social History Narrative  . Not on file   Social Determinants of Health   Financial Resource Strain:   . Difficulty of Paying Living Expenses:   Food Insecurity:   . Worried About Charity fundraiser in the Last Year:   . Arboriculturist in the Last Year:   Transportation Needs:   . Film/video editor (Medical):   Marland Kitchen Lack of Transportation (Non-Medical):   Physical Activity:   . Days of Exercise per Week:   . Minutes of Exercise per Session:   Stress:   . Feeling of Stress :   Social Connections:   . Frequency of Communication with Friends and Family:   . Frequency of Social Gatherings with Friends and Family:   . Attends Religious Services:   . Active Member of Clubs  or Organizations:   . Attends Archivist Meetings:   Marland Kitchen Marital Status:   Intimate Partner Violence:   . Fear of Current or Ex-Partner:   . Emotionally Abused:   Marland Kitchen Physically Abused:   . Sexually Abused:    ROS-see HPI   + = positive Constitutional:    weight loss, night sweats, fevers, chills, fatigue, lassitude. HEENT:    headaches, +difficulty swallowing, tooth/dental problems, sore throat,       sneezing, itching, ear ache, nasal congestion, post nasal drip, snoring CV:    chest pain, orthopnea, PND, swelling in lower extremities, anasarca,                                  dizziness, palpitations Resp:   +shortness of breath with exertion or at rest.                productive cough,   non-productive cough, coughing up of blood.              change in color of mucus.  +wheezing.   Skin:    rash or lesions. GI:  No-   heartburn, indigestion, abdominal pain, nausea, vomiting, diarrhea,                 change in bowel habits, loss of appetite GU: dysuria, change in color of urine, no urgency or frequency.   flank pain. MS:  + joint pain, stiffness, decreased range of motion, back pain. Neuro-     nothing unusual Psych:  change in mood or affect.  depression or anxiety.   memory loss.  OBJ- Physical Exam General- Alert, Oriented, Affect-appropriate, Distress- none acute Skin- rash-none, lesions- none, excoriation- none Lymphadenopathy- none Head- atraumatic            Eyes- Gross vision intact, PERRLA, conjunctivae and secretions clear            Ears- Hearing, canals-normal            Nose- Clear, no-Septal dev, mucus, polyps, erosion, perforation             Throat- Mallampati II , mucosa clear , drainage- none, tonsils- atrophic Neck- flexible , trachea midline, no stridor , thyroid nl, carotid no bruit Chest - symmetrical excursion , unlabored  Heart/CV- RRR , no murmur , no gallop  , no rub, nl s1 s2                           - JVD- none , edema- none,  stasis changes- none, varices- none           Lung- clear to P&A, wheeze- none, cough- none , dullness-none, rub- none           Chest wall-  Abd-  Br/ Gen/ Rectal- Not done, not indicated Extrem- cyanosis- none, clubbing, none, atrophy- none, strength- nl,  + superficial varices Neuro- grossly intact to observation       ROS-see HPI Constitutional:   No-   weight loss, night sweats, fevers, chills, fatigue, lassitude. HEENT:   No-  headaches, difficulty swallowing, tooth/dental problems, sore throat,       No-  sneezing, itching, ear ache, nasal congestion, post nasal drip,  CV:  No-   chest pain, orthopnea, PND, swelling in lower extremities, anasarca, dizziness, palpitations Resp: +  shortness of breath with exertion or at rest.              No-   productive cough,  No non-productive cough,  No- coughing up of blood.              No-   change in color of mucus.  No- wheezing.   Skin: No-   rash or lesions. GI:  No-   heartburn, indigestion, abdominal pain, nausea, vomiting, GU: . MS:  No-   joint pain or swelling.   Neuro-     nothing unusual Psych:  No- change in mood or affect. No depression or anxiety.  No memory loss.  OBJ- Physical Exam General- Alert, Oriented, Affect-appropriate, Distress- none acute. overweight Skin- rash-none, lesions- none, excoriation- none Lymphadenopathy- none Head- atraumatic            Eyes- Gross vision intact, PERRLA, conjunctivae and secretions clear            Ears- Hearing, canals-normal            Nose- Clear, no-Septal dev, mucus, polyps, erosion, perforation             Throat- Mallampati III , mucosa clear , drainage- none, tonsils- atrophic Neck- flexible , trachea midline, no stridor , thyroid nl, carotid no bruit Chest - symmetrical excursion , unlabored           Heart/CV- RRR , no murmur , no gallop  , no rub, nl s1 s2                           - JVD- none , edema- none, stasis changes- none, varices- none           Lung-   clear, wheeze- none, cough- none , dullness-none, rub- none           Chest wall-  Abd-  Br/ Gen/ Rectal- Not done, not indicated Extrem- cyanosis- none, clubbing, none, atrophy- none, strength- nl Neuro- grossly intact to observation

## 2019-10-26 NOTE — Assessment & Plan Note (Signed)
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

## 2019-10-26 NOTE — Assessment & Plan Note (Signed)
Managed by Dr Samuella Cota.

## 2019-10-26 NOTE — Assessment & Plan Note (Signed)
We will initially try maintenance control with samples of Trelegy. On return we will begin planning PFT, but won't be able to schedule before her TKR.   Bronchiectatis on CT 2014 could be from old infection or microaspiration.

## 2019-11-01 DIAGNOSIS — M1711 Unilateral primary osteoarthritis, right knee: Secondary | ICD-10-CM | POA: Diagnosis not present

## 2019-11-15 ENCOUNTER — Telehealth: Payer: Self-pay | Admitting: Internal Medicine

## 2019-11-15 MED ORDER — TRELEGY ELLIPTA 100-62.5-25 MCG/INH IN AEPB: 1.0000 | INHALATION_SPRAY | Freq: Every day | RESPIRATORY_TRACT | 5 refills | Status: DC

## 2019-11-15 NOTE — Telephone Encounter (Signed)
Rx for Trelegy sent to pharmacy for pt. Called and spoke with pt letting her know this had been done and she verbalized understanding. Nothing further needed.

## 2019-11-26 DIAGNOSIS — M1711 Unilateral primary osteoarthritis, right knee: Secondary | ICD-10-CM | POA: Diagnosis not present

## 2019-11-26 DIAGNOSIS — E7800 Pure hypercholesterolemia, unspecified: Secondary | ICD-10-CM | POA: Diagnosis not present

## 2019-11-26 DIAGNOSIS — J45909 Unspecified asthma, uncomplicated: Secondary | ICD-10-CM | POA: Diagnosis not present

## 2019-11-26 DIAGNOSIS — G4733 Obstructive sleep apnea (adult) (pediatric): Secondary | ICD-10-CM | POA: Diagnosis not present

## 2019-11-30 ENCOUNTER — Encounter (HOSPITAL_COMMUNITY): Payer: Self-pay

## 2019-11-30 NOTE — Patient Instructions (Addendum)
DUE TO COVID-19 ONLY ONE VISITOR ARE ALLOWED TO COME WITH YOU AND STAY IN THE WAITING ROOM ONLY DURING PRE OP AND PROCEDURE. THEN TWO VISITORS MAY VISIT WITH YOU IN YOUR PRIVATE ROOM DURING VISITING HOURS ONLY!!   COVID SWAB TESTING MUST BE COMPLETED ON:  Thursday, December 02, 2019 at 2:55 PM   [address], [place] -Former [facility] enter pre surgical testing line (Must self quarantine after testing. Follow instructions on handout.)         Your procedure is scheduled on: Monday, December 06, 2019   Report to [facility] Main  Entrance    Report to admitting at 9:00 AM   Call this number if you have problems the morning of surgery [phone]   Do not eat food :After Midnight.   May have liquids until 8:30 AM    day of surgery   CLEAR LIQUID DIET  Foods Allowed                                                                     Foods Excluded  Water, Black Coffee and tea, regular and decaf                             liquids that you cannot  Plain Jell-O in any flavor  (No red)                                           see through such as: Fruit ices (not with fruit pulp)                                     milk, soups, orange juice  Iced Popsicles (No red)                                    All solid food                                   Apple juices Sports drinks like Gatorade (No red) Lightly seasoned clear broth or consume(fat free) Sugar, honey syrup  Sample Menu Breakfast                                Lunch                                     Supper Cranberry juice                    Beef broth                            Chicken broth Jell-O  Grape juice                           Apple juice Coffee or tea                        Jell-O                                      Popsicle                                                Coffee or tea                        Coffee or tea   Complete one Ensure drink the  morning of surgery at 8:30 AM the day of surgery.   Oral Hygiene is also important to reduce your risk of infection.                                    Remember - BRUSH YOUR TEETH THE MORNING OF SURGERY WITH YOUR REGULAR TOOTHPASTE   Do NOT smoke after Midnight   Take these medicines the morning of surgery with A SIP OF WATER: Nexium, Gabapentin   Use Inhaler day of surgery   Bring CPAP mask and tubing day of surgery                               You may not have any metal on your body including hair pins, jewelry, and body piercings             Do not wear make-up, lotions, powders, perfumes/cologne, or deodorant             Do not wear nail polish.  Do not shave  48 hours prior to surgery.              Do not bring valuables to the hospital. [place].   Contacts, dentures or bridgework may not be worn into surgery.   Bring small overnight bag day of surgery.    Special Instructions: Bring a copy of your healthcare power of attorney and living will documents         the day of surgery if you haven't scanned them in before.              Please read over the following fact sheets you were given: IF YOU HAVE QUESTIONS ABOUT YOUR PRE OP INSTRUCTIONS PLEASE CALL [phone]   [place] - Preparing for Surgery Before surgery, you can play an important role.  Because skin is not sterile, your skin needs to be as free of germs as possible.  You can reduce the number of germs on your skin by washing with CHG (chlorahexidine gluconate) soap before surgery.  CHG is an antiseptic cleaner which kills germs and bonds with the skin to continue killing germs even after washing. Please DO NOT use if you have an  allergy to CHG or antibacterial soaps.  If your skin becomes reddened/irritated stop using the CHG and inform your nurse when you arrive at Short Stay. Do not shave (including legs and underarms) for at least 48 hours prior to the first CHG  shower.  You may shave your face/neck.  Please follow these instructions carefully:  1.  Shower with CHG Soap the night before surgery and the  morning of surgery.  2.  If you choose to wash your hair, wash your hair first as usual with your normal  shampoo.  3.  After you shampoo, rinse your hair and body thoroughly to remove the shampoo.                             4.  Use CHG as you would any other liquid soap.  You can apply chg directly to the skin and wash.  Gently with a scrungie or clean washcloth.  5.  Apply the CHG Soap to your body ONLY FROM THE NECK DOWN.   Do   not use on face/ open                           Wound or open sores. Avoid contact with eyes, ears mouth and   genitals (private parts).                       Wash face,  Genitals (private parts) with your normal soap.             6.  Wash thoroughly, paying special attention to the area where your    surgery  will be performed.  7.  Thoroughly rinse your body with warm water from the neck down.  8.  DO NOT shower/wash with your normal soap after using and rinsing off the CHG Soap.                9.  Pat yourself dry with a clean towel.            10.  Wear clean pajamas.            11.  Place clean sheets on your bed the night of your first shower and do not  sleep with pets. Day of Surgery : Do not apply any lotions/deodorants the morning of surgery.  Please wear clean clothes to the hospital/surgery center.  FAILURE TO FOLLOW THESE INSTRUCTIONS MAY RESULT IN THE CANCELLATION OF YOUR SURGERY  PATIENT SIGNATURE_________________________________  NURSE SIGNATURE__________________________________  ________________________________________________________________________   Christy Stephenson  An incentive spirometer is a tool that can help keep your lungs clear and active. This tool measures how well you are filling your lungs with each breath. Taking long deep breaths may help reverse or decrease the chance of  developing breathing (pulmonary) problems (especially infection) following:  A long period of time when you are unable to move or be active. BEFORE THE PROCEDURE   If the spirometer includes an indicator to show your best effort, your nurse or respiratory therapist will set it to a desired goal.  If possible, sit up straight or lean slightly forward. Try not to slouch.  Hold the incentive spirometer in an upright position. INSTRUCTIONS FOR USE  1. Sit on the edge of your bed if possible, or sit up as far as you can in bed or on a chair. 2. Hold the incentive spirometer  in an upright position. 3. Breathe out normally. 4. Place the mouthpiece in your mouth and seal your lips tightly around it. 5. Breathe in slowly and as deeply as possible, raising the piston or the ball toward the top of the column. 6. Hold your breath for 3-5 seconds or for as long as possible. Allow the piston or ball to fall to the bottom of the column. 7. Remove the mouthpiece from your mouth and breathe out normally. 8. Rest for a few seconds and repeat Steps 1 through 7 at least 10 times every 1-2 hours when you are awake. Take your time and take a few normal breaths between deep breaths. 9. The spirometer may include an indicator to show your best effort. Use the indicator as a goal to work toward during each repetition. 10. After each set of 10 deep breaths, practice coughing to be sure your lungs are clear. If you have an incision (the cut made at the time of surgery), support your incision when coughing by placing a pillow or rolled up towels firmly against it. Once you are able to get out of bed, walk around indoors and cough well. You may stop using the incentive spirometer when instructed by your caregiver.  RISKS AND COMPLICATIONS  Take your time so you do not get dizzy or light-headed.  If you are in pain, you may need to take or ask for pain medication before doing incentive spirometry. It is harder to take a  deep breath if you are having pain. AFTER USE  Rest and breathe slowly and easily.  It can be helpful to keep track of a log of your progress. Your caregiver can provide you with a simple table to help with this. If you are using the spirometer at home, follow these instructions: [place] IF:   You are having difficultly using the spirometer.  You have trouble using the spirometer as often as instructed.  Your pain medication is not giving enough relief while using the spirometer.  You develop fever of 100.5 F (38.1 C) or higher. SEEK IMMEDIATE MEDICAL CARE IF:   You cough up bloody sputum that had not been present before.  You develop fever of 102 F (38.9 C) or greater.  You develop worsening pain at or near the incision site. MAKE SURE YOU:   Understand these instructions.  Will watch your condition.  Will get help right away if you are not doing well or get worse. Document Released: 09/16/2006 Document Revised: 07/29/2011 Document Reviewed: 11/17/2006 ExitCare Patient Information 2014 ExitCare, [place].   ________________________________________________________________________  WHAT IS A BLOOD TRANSFUSION? Blood Transfusion Information  A transfusion is the replacement of blood or some of its parts. Blood is made up of multiple cells which provide different functions.  Red blood cells carry oxygen and are used for blood loss replacement.  White blood cells fight against infection.  Platelets control bleeding.  Plasma helps clot blood.  Other blood products are available for specialized needs, such as hemophilia or other clotting disorders. BEFORE THE TRANSFUSION  Who gives blood for transfusions?   Healthy volunteers who are fully evaluated to make sure their blood is safe. This is blood bank blood. Transfusion therapy is the safest it has ever been in the practice of medicine. Before blood is taken from a donor, a complete history is taken to make  sure that person has no history of diseases nor engages in risky social behavior (examples are intravenous drug use or sexual  activity with multiple partners). The donor's travel history is screened to minimize risk of transmitting infections, such as malaria. The donated blood is tested for signs of infectious diseases, such as HIV and hepatitis. The blood is then tested to be sure it is compatible with you in order to minimize the chance of a transfusion reaction. If you or a relative donates blood, this is often done in anticipation of surgery and is not appropriate for emergency situations. It takes many days to process the donated blood. RISKS AND COMPLICATIONS Although transfusion therapy is very safe and saves many lives, the main dangers of transfusion include:   Getting an infectious disease.  Developing a transfusion reaction. This is an allergic reaction to something in the blood you were given. Every precaution is taken to prevent this. The decision to have a blood transfusion has been considered carefully by your caregiver before blood is given. Blood is not given unless the benefits outweigh the risks. AFTER THE TRANSFUSION  Right after receiving a blood transfusion, you will usually feel much better and more energetic. This is especially true if your red blood cells have gotten low (anemic). The transfusion raises the level of the red blood cells which carry oxygen, and this usually causes an energy increase.  The nurse administering the transfusion will monitor you carefully for complications. HOME CARE INSTRUCTIONS  No special instructions are needed after a transfusion. You may find your energy is better. Speak with your caregiver about any limitations on activity for underlying diseases you may have. SEEK MEDICAL CARE IF:   Your condition is not improving after your transfusion.  You develop redness or irritation at the intravenous (IV) site. SEEK IMMEDIATE MEDICAL CARE IF:   Any of the following symptoms occur over the next 12 hours:  Shaking chills.  You have a temperature by mouth above 102 F (38.9 C), not controlled by medicine.  Chest, back, or muscle pain.  People around you feel you are not acting correctly or are confused.  Shortness of breath or difficulty breathing.  Dizziness and fainting.  You get a rash or develop hives.  You have a decrease in urine output.  Your urine turns a dark color or changes to pink, red, or brown. Any of the following symptoms occur over the next 10 days:  You have a temperature by mouth above 102 F (38.9 C), not controlled by medicine.  Shortness of breath.  Weakness after normal activity.  The white part of the eye turns yellow (jaundice).  You have a decrease in the amount of urine or are urinating less often.  Your urine turns a dark color or changes to pink, red, or brown. Document Released: 05/03/2000 Document Revised: 07/29/2011 Document Reviewed: 12/21/2007 [facility] Patient Information 2014 [place], [place].  _______________________________________________________________________

## 2019-11-30 NOTE — Progress Notes (Addendum)
COVID Vaccine Completed: Yes Date COVID Vaccine completed: 08/23/2019, 09/20/2019 COVID vaccine manufacturer: Pfizer       PCP - Dr. Alfonso Patten. Polite clearance received and placed in chart. Cardiologist - none currently Pulmonologist: Dr. Glynn Octave  Chest x-ray - 10/26/19 in epic EKG - greater than 1 year Stress Test - greater than 2 years ECHO - greater than 2 years Cardiac Cath - greater than 2 years  Sleep Study - Yes  CPAP - Yes  Fasting Blood Sugar - N/A Checks Blood Sugar ___N/A__ times a day  Blood Thinner Instructions: N/A Aspirin Instructions:N/A Last Dose:N/A  Anesthesia review: OSA, Stroke, COPD  Patient denies shortness of breath, fever, cough and chest pain at PAT appointment   Patient verbalized understanding of instructions that were given to them at the PAT appointment. Patient was also instructed that they will need to review over the PAT instructions again at home before surgery.

## 2019-12-01 ENCOUNTER — Other Ambulatory Visit: Payer: Self-pay

## 2019-12-01 ENCOUNTER — Encounter (HOSPITAL_COMMUNITY): Payer: Self-pay

## 2019-12-01 ENCOUNTER — Encounter (HOSPITAL_COMMUNITY)
Admission: RE | Admit: 2019-12-01 | Discharge: 2019-12-01 | Disposition: A | Discharge status: Home / Self Care | Payer: Medicare Other | Source: Ambulatory Visit | Attending: Orthopedic Surgery | Admitting: Orthopedic Surgery

## 2019-12-01 DIAGNOSIS — Z01812 Encounter for preprocedural laboratory examination: Secondary | ICD-10-CM | POA: Diagnosis not present

## 2019-12-01 HISTORY — DX: Personal history of other diseases of the nervous system and sense organs: Z86.69

## 2019-12-01 HISTORY — DX: Other specified postprocedural states: R11.2

## 2019-12-01 HISTORY — DX: Chronic obstructive pulmonary disease, unspecified: J44.9

## 2019-12-01 HISTORY — DX: Personal history of diseases of the blood and blood-forming organs and certain disorders involving the immune mechanism: Z86.2

## 2019-12-01 HISTORY — DX: Other specified postprocedural states: Z98.890

## 2019-12-01 HISTORY — DX: Basal cell carcinoma of skin, unspecified: C44.91

## 2019-12-01 HISTORY — DX: Palpitations: R00.2

## 2019-12-01 LAB — PROTIME-INR
INR: 1 (ref 0.8–1.2)
Prothrombin Time: 12.7 seconds (ref 11.4–15.2)

## 2019-12-01 LAB — APTT: aPTT: 27 seconds (ref 24–36)

## 2019-12-01 LAB — SURGICAL PCR SCREEN
MRSA, PCR: NEGATIVE
Staphylococcus aureus: NEGATIVE

## 2019-12-01 NOTE — Progress Notes (Signed)
Cbc/diff, CMP results 11/26/2019 received from Dr. Lina Sar office placed in chart.

## 2019-12-02 ENCOUNTER — Other Ambulatory Visit (HOSPITAL_COMMUNITY)
Admission: RE | Admit: 2019-12-02 | Discharge: 2019-12-02 | Disposition: A | Discharge status: Home / Self Care | Payer: Medicare Other | Source: Ambulatory Visit | Attending: Orthopedic Surgery | Admitting: Orthopedic Surgery

## 2019-12-02 DIAGNOSIS — Z20822 Contact with and (suspected) exposure to covid-19: Secondary | ICD-10-CM | POA: Diagnosis not present

## 2019-12-02 DIAGNOSIS — Z01812 Encounter for preprocedural laboratory examination: Secondary | ICD-10-CM | POA: Diagnosis not present

## 2019-12-02 LAB — SARS CORONAVIRUS 2 (TAT 6-24 HRS): SARS Coronavirus 2: NEGATIVE

## 2019-12-05 MED ORDER — BUPIVACAINE LIPOSOME 1.3 % IJ SUSP
20.0000 mL | Freq: Once | INTRAMUSCULAR | Status: DC
  Filled 2019-12-05: qty 20

## 2019-12-05 NOTE — H&P (Signed)
TOTAL KNEE ADMISSION H&P  Patient is being admitted for right total knee arthroplasty.  Subjective:  Chief Complaint:right knee pain.  HPI: Christy Stephenson, 79 y.o. female, has a history of pain and functional disability in the right knee due to arthritis and has failed non-surgical conservative treatments for greater than 12 weeks to includecorticosteriod injections, viscosupplementation injections and activity modification.  Onset of symptoms was gradual, starting 3 years ago with gradually worsening course since that time. The patient noted prior procedures on the knee to include  arthroscopy and menisectomy on the right knee(s).  Patient currently rates pain in the right knee(s) at 8 out of 10 with activity. Patient has worsening of pain with activity and weight bearing and pain that interferes with activities of daily living.  Patient has evidence of joint space narrowing by imaging studies.There is no active infection.  Patient Active Problem List   Diagnosis Date Noted  . 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 Dyspnea 07/09/2012  . Abnormal finding on cardiovascular stress test 07/09/2012  . OTHER CHEST PAIN 03/01/2010  . ELECTROCARDIOGRAM, ABNORMAL 03/01/2010  . 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 Asthma with bronchitis 02/28/2010  . Obstructive sleep apnea 02/28/2010  . MEMORY LOSS 02/28/2010   Past Medical History:  Diagnosis Date  . Arthritis    osteo  . Asthma   . Asthma   . Basal cell carcinoma    face  . COPD (chronic obstructive pulmonary disease) ([place])   . GERD (gastroesophageal reflux disease)   . Heart palpitations   . History of iron deficiency anemia   . History of migraine    while taking birth control pills  . Hypercholesteremia   . Neuropathy    left greater than right feet  . Obesity   . Obstructive sleep apnea 07/09/2012   CPAP   . Peripheral neuropathy   . Pneumonia    as a child  .  PONV (postoperative nausea and vomiting)    not since 1967  . Shortness of breath    with exertion  . Stroke ([place])   . TIA (transient ischemic attack) 2014  . TIA (transient ischemic attack) 2014   no residual - no follow up needed  . 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 BREAST BIOPSY Right   . BREAST CYST EXCISION Left   . CARDIAC CATHETERIZATION  07/09/2012  . cataracts Bilateral 2013   this was second one  . CHOLECYSTECTOMY N/A 02/18/2017   Procedure: LAPAROSCOPIC CHOLECYSTECTOMY WITH INTRAOPERATIVE CHOLANGIOGRAM;  Surgeon: Erroll Luna, MD;  Location: [facility];  Service: General;  Laterality: N/A;  . COLONOSCOPY    . DILATION AND CURETTAGE OF UTERUS    . FINGER SURGERY Right 1971   ring finger repair  . KNEE SURGERY Right    arthroscopy  . LEFT HEART CATHETERIZATION WITH CORONARY ANGIOGRAM N/A 07/09/2012   Procedure: LEFT HEART CATHETERIZATION WITH CORONARY ANGIOGRAM;  Surgeon: Sinclair Grooms, MD;  Location: [facility] CATH LAB;  Service: Cardiovascular;  Laterality: N/A;  . LUMBAR FUSION     L3-S1  . TONSILLECTOMY    . TUBAL LIGATION      Current Facility-Administered Medications  Medication Dose Route Frequency Provider Last Rate Last Admin  . [START ON 12/06/2019] bupivacaine liposome (EXPAREL) 1.3 % injection 266 mg  20 mL Other Once Gaynelle Arabian, MD       Current Outpatient Medications  Medication  Sig Dispense Refill Last Dose  . Ascorbic Acid (VITAMIN C) 1000 MG tablet Take 1,000 mg by mouth daily.     Marland Kitchen CALCIUM-VITAMIN D PO Take 1 tablet by mouth 2 (two) times daily.     . Carboxymethylcellulose Sodium (EQ RESTORE TEARS OP) Place 1 drop into both eyes 3 (three) times daily as needed (dry eyes).      . Cholecalciferol (VITAMIN D3) 125 MCG (5000 UT) CAPS Take 5,000 Units by mouth daily.      . cholestyramine (QUESTRAN) 4 g packet Take 4 g by mouth daily.     . diclofenac (VOLTAREN) 75 MG EC tablet Take 75 mg by mouth 2  (two) times daily.      Marland Kitchen esomeprazole (NEXIUM 24HR) 20 MG capsule Take 20 mg by mouth 2 (two) times daily before a meal.      . estradiol (VIVELLE-DOT) 0.025 MG/24HR Place 1 patch onto the skin 2 (two) times a week.     . Fluticasone-Umeclidin-Vilant (TRELEGY ELLIPTA) 100-62.5-25 MCG/INH AEPB Inhale 1 puff into the lungs daily. 60 each 5   . gabapentin (NEURONTIN) 600 MG tablet Take 600 mg by mouth in the morning, at noon, in the evening, and at bedtime.      . Multiple Vitamin (MULTIVITAMIN WITH MINERALS) TABS Take 1 tablet by mouth daily.     Marland Kitchen OVER THE COUNTER MEDICATION Take 1 capsule by mouth 2 (two) times daily. OTC Leg Veins     . progesterone (PROMETRIUM) 100 MG capsule Take 100 mg by mouth at bedtime.      . vitamin E 400 UNIT capsule Take 400 Units by mouth daily.     Marland Kitchen zinc gluconate 50 MG tablet Take 50 mg by mouth daily.      Allergies  Allergen Reactions  . Sulfa Antibiotics Other (See Comments) and Palpitations  . Actifed [Triprolidine-Pse] Palpitations  . Lipitor [Atorvastatin] Other (See Comments)    Aches and pains  . Bupropion Hcl Itching and Rash    Social History   Tobacco Use  . Smoking status: Never Smoker  . Smokeless tobacco: Never Used  . Tobacco comment: Had tried when younger  Substance Use Topics  . Alcohol use: No    Family History  Problem Relation Age of Onset  . Diabetes Mellitus II Mother   . CVA Father   . Heart attack Father   . Lung cancer Father   . Tuberculosis Sister        bovine     Review of Systems  Constitutional: Negative for chills and fever.  Respiratory: Negative for cough and shortness of breath.   Cardiovascular: Negative for chest pain.  Gastrointestinal: Negative for nausea and vomiting.  Musculoskeletal: Positive for arthralgias.    Objective:  Physical Exam Patient is a 79 year old female.  Well nourished and well developed. General: Alert and oriented x3, cooperative and pleasant, no acute distress. Head:  normocephalic, atraumatic, neck supple. Eyes: EOMI. Respiratory: breath sounds clear in all fields, no wheezing, rales, or rhonchi. Cardiovascular: Regular rate and rhythm, no murmurs, gallops or rubs. Abdomen: non-tender to palpation and soft, normoactive bowel sounds. Musculoskeletal:  Right Knee Exam: No effusion present. No swelling present. The range of motion is: 5 to 120 degrees. Moderate crepitus on range of motion of the knee. Significant medial joint line tenderness. No lateral joint line tenderness. The knee is stable.  Calves soft and nontender. Motor function intact in LE. Strength 5/5 LE bilaterally. Neuro: Distal pulses 2+. Sensation to  light touch intact in LE. Vital signs in last 24 hours:    Labs:   Estimated body mass index is 37.73 kg/m as calculated from the following:   Height as of 12/01/19: 5\' 4"  (1.626 m).   Weight as of 12/01/19: 99.7 kg.   Imaging Review Plain radiographs demonstrate severe degenerative joint disease of the right knee(s). The overall alignment isneutral. The bone quality appears to be adequate for age and reported activity level.      Assessment/Plan:  End stage arthritis, right knee   The patient history, physical examination, clinical judgment of the provider and imaging studies are consistent with end stage degenerative joint disease of the right knee(s) and total knee arthroplasty is deemed medically necessary. The treatment options including medical management, injection therapy arthroscopy and arthroplasty were discussed at length. The risks and benefits of total knee arthroplasty were presented and reviewed. The risks due to aseptic loosening, infection, stiffness, patella tracking problems, thromboembolic complications and other imponderables were discussed. The patient acknowledged the explanation, agreed to proceed with the plan and consent was signed. Patient is being admitted for inpatient treatment for surgery, pain  control, PT, OT, prophylactic antibiotics, VTE prophylaxis, progressive ambulation and ADL's and discharge planning. The patient is planning to be discharged home.  Therapy Plans: outpatient therapy at [facility] Disposition: Home with husband Planned DVT Prophylaxis: aspirin 325 BID DME Needed: none PCP: Seward Carol, MD (no clearance) TXA: IV Allergies: actifed - palpitations, Lipitor - myalgias, Wellbutrin - rash, confusion, sulfa - nausea/vomiting Anesthesia Concerns: none BMI: 37.2 Not diabetic.  Other: Prefers Norco instead of oxycodone. Hx of possible TIA in 2014.  Anticipated LOS equal to or greater than 2 midnights due to - Age 72 and older with one or more of the following:  - Obesity  - Expected need for hospital services (PT, OT, Nursing) required for safe  discharge  - Anticipated need for postoperative skilled nursing care or inpatient rehab  - Active co-morbidities: None OR   - Unanticipated findings during/Post Surgery: None  - Patient is a high risk of re-admission due to: None   - Patient was instructed on what medications to stop prior to surgery. - Follow-up visit in 2 weeks with Dr. Wynelle Link - Begin physical therapy following surgery - Pre-operative lab work as pre-surgical testing - Prescriptions will be provided in hospital at time of discharge  Griffith Citron, PA-C Orthopedic Surgery [facility] [place] [phone]

## 2019-12-06 ENCOUNTER — Ambulatory Visit (HOSPITAL_COMMUNITY): Payer: Medicare Other | Admitting: Emergency Medicine

## 2019-12-06 ENCOUNTER — Ambulatory Visit (HOSPITAL_COMMUNITY): Payer: Medicare Other | Admitting: Certified Registered Nurse Anesthetist

## 2019-12-06 ENCOUNTER — Other Ambulatory Visit: Payer: Self-pay

## 2019-12-06 ENCOUNTER — Encounter (HOSPITAL_COMMUNITY): Payer: Self-pay | Admitting: Orthopedic Surgery

## 2019-12-06 ENCOUNTER — Observation Stay (HOSPITAL_COMMUNITY)
Admission: RE | Admit: 2019-12-06 | Discharge: 2019-12-07 | Disposition: A | Discharge status: Home / Self Care | Payer: Medicare Other | Attending: Orthopedic Surgery | Admitting: Orthopedic Surgery

## 2019-12-06 ENCOUNTER — Encounter (HOSPITAL_COMMUNITY)
Admission: RE | Disposition: A | Discharge status: Home / Self Care | Payer: Self-pay | Source: Home / Self Care | Attending: Orthopedic Surgery

## 2019-12-06 DIAGNOSIS — E785 Hyperlipidemia, unspecified: Secondary | ICD-10-CM | POA: Diagnosis not present

## 2019-12-06 DIAGNOSIS — M1711 Unilateral primary osteoarthritis, right knee: Principal | ICD-10-CM | POA: Diagnosis present

## 2019-12-06 DIAGNOSIS — J40 Bronchitis, not specified as acute or chronic: Secondary | ICD-10-CM | POA: Diagnosis not present

## 2019-12-06 DIAGNOSIS — Z79899 Other long term (current) drug therapy: Secondary | ICD-10-CM | POA: Diagnosis not present

## 2019-12-06 DIAGNOSIS — K219 Gastro-esophageal reflux disease without esophagitis: Secondary | ICD-10-CM | POA: Insufficient documentation

## 2019-12-06 DIAGNOSIS — Z8673 Personal history of transient ischemic attack (TIA), and cerebral infarction without residual deficits: Secondary | ICD-10-CM | POA: Diagnosis not present

## 2019-12-06 DIAGNOSIS — J449 Chronic obstructive pulmonary disease, unspecified: Secondary | ICD-10-CM | POA: Insufficient documentation

## 2019-12-06 DIAGNOSIS — G4733 Obstructive sleep apnea (adult) (pediatric): Secondary | ICD-10-CM | POA: Diagnosis not present

## 2019-12-06 DIAGNOSIS — Z9989 Dependence on other enabling machines and devices: Secondary | ICD-10-CM | POA: Diagnosis not present

## 2019-12-06 DIAGNOSIS — M179 Osteoarthritis of knee, unspecified: Secondary | ICD-10-CM | POA: Diagnosis present

## 2019-12-06 DIAGNOSIS — G8918 Other acute postprocedural pain: Secondary | ICD-10-CM | POA: Diagnosis not present

## 2019-12-06 HISTORY — PX: TOTAL KNEE ARTHROPLASTY: SHX125

## 2019-12-06 LAB — TYPE AND SCREEN
ABO/RH(D): A POS
Antibody Screen: NEGATIVE

## 2019-12-06 SURGERY — ARTHROPLASTY, KNEE, TOTAL
Anesthesia: Spinal | Site: Knee | Laterality: Right

## 2019-12-06 MED ORDER — DEXAMETHASONE SODIUM PHOSPHATE 10 MG/ML IJ SOLN
INTRAMUSCULAR | Status: DC | PRN
  Administered 2019-12-06: 10 mg via INTRAVENOUS

## 2019-12-06 MED ORDER — DEXAMETHASONE SODIUM PHOSPHATE 10 MG/ML IJ SOLN
10.0000 mg | Freq: Once | INTRAMUSCULAR | Status: AC
  Administered 2019-12-07: 10 mg via INTRAVENOUS
  Filled 2019-12-06: qty 1

## 2019-12-06 MED ORDER — POVIDONE-IODINE 10 % EX SWAB
2.0000 "application " | Freq: Once | CUTANEOUS | Status: AC
  Administered 2019-12-06: 2 via TOPICAL

## 2019-12-06 MED ORDER — HYDROCODONE-ACETAMINOPHEN 7.5-325 MG PO TABS
1.0000 | ORAL_TABLET | ORAL | Status: DC | PRN
  Administered 2019-12-06: 2 via ORAL
  Administered 2019-12-06 (×2): 1 via ORAL
  Administered 2019-12-07 (×4): 2 via ORAL
  Filled 2019-12-06 (×4): qty 2
  Filled 2019-12-06 (×2): qty 1
  Filled 2019-12-06 (×2): qty 2

## 2019-12-06 MED ORDER — POLYETHYLENE GLYCOL 3350 17 G PO PACK: 17.0000 g | PACK | Freq: Every day | ORAL | Status: DC | PRN

## 2019-12-06 MED ORDER — LACTATED RINGERS IV SOLN: INTRAVENOUS | Status: DC

## 2019-12-06 MED ORDER — ONDANSETRON HCL 4 MG/2ML IJ SOLN: 4.0000 mg | Freq: Four times a day (QID) | INTRAMUSCULAR | Status: DC | PRN

## 2019-12-06 MED ORDER — ONDANSETRON HCL 4 MG PO TABS: 4.0000 mg | ORAL_TABLET | Freq: Four times a day (QID) | ORAL | Status: DC | PRN

## 2019-12-06 MED ORDER — METHOCARBAMOL 500 MG PO TABS
500.0000 mg | ORAL_TABLET | Freq: Four times a day (QID) | ORAL | Status: DC | PRN
  Administered 2019-12-06 – 2019-12-07 (×2): 500 mg via ORAL
  Filled 2019-12-06 (×2): qty 1

## 2019-12-06 MED ORDER — MORPHINE SULFATE (PF) 2 MG/ML IV SOLN
0.5000 mg | INTRAVENOUS | Status: DC | PRN
  Administered 2019-12-07: 1 mg via INTRAVENOUS
  Filled 2019-12-06: qty 1

## 2019-12-06 MED ORDER — DOCUSATE SODIUM 100 MG PO CAPS
100.0000 mg | ORAL_CAPSULE | Freq: Two times a day (BID) | ORAL | Status: DC
  Administered 2019-12-07: 100 mg via ORAL
  Filled 2019-12-06 (×2): qty 1

## 2019-12-06 MED ORDER — HYDROCODONE-ACETAMINOPHEN 5-325 MG PO TABS: 1.0000 | ORAL_TABLET | ORAL | Status: DC | PRN

## 2019-12-06 MED ORDER — CEFAZOLIN SODIUM-DEXTROSE 2-4 GM/100ML-% IV SOLN
2.0000 g | INTRAVENOUS | Status: AC
  Administered 2019-12-06: 2 g via INTRAVENOUS
  Filled 2019-12-06: qty 100

## 2019-12-06 MED ORDER — FLUTICASONE FUROATE-VILANTEROL 100-25 MCG/INH IN AEPB
1.0000 | INHALATION_SPRAY | Freq: Every day | RESPIRATORY_TRACT | Status: DC
  Filled 2019-12-06: qty 28

## 2019-12-06 MED ORDER — FLEET ENEMA 7-19 GM/118ML RE ENEM: 1.0000 | ENEMA | Freq: Once | RECTAL | Status: DC | PRN

## 2019-12-06 MED ORDER — SODIUM CHLORIDE 0.9 % IV SOLN: INTRAVENOUS | Status: DC

## 2019-12-06 MED ORDER — PANTOPRAZOLE SODIUM 40 MG PO TBEC
40.0000 mg | DELAYED_RELEASE_TABLET | Freq: Every day | ORAL | Status: DC
  Administered 2019-12-07: 40 mg via ORAL
  Filled 2019-12-06: qty 1

## 2019-12-06 MED ORDER — DEXAMETHASONE SODIUM PHOSPHATE 10 MG/ML IJ SOLN: 8.0000 mg | Freq: Once | INTRAMUSCULAR | Status: DC

## 2019-12-06 MED ORDER — PROGESTERONE MICRONIZED 100 MG PO CAPS
100.0000 mg | ORAL_CAPSULE | Freq: Every day | ORAL | Status: DC
  Administered 2019-12-06: 100 mg via ORAL
  Filled 2019-12-06 (×2): qty 1

## 2019-12-06 MED ORDER — HYDROMORPHONE HCL 1 MG/ML IJ SOLN: 0.2500 mg | INTRAMUSCULAR | Status: DC | PRN

## 2019-12-06 MED ORDER — MIDAZOLAM HCL 2 MG/2ML IJ SOLN
1.0000 mg | INTRAMUSCULAR | Status: DC
  Administered 2019-12-06: 2 mg via INTRAVENOUS
  Filled 2019-12-06: qty 2

## 2019-12-06 MED ORDER — SODIUM CHLORIDE 0.9 % IR SOLN
Status: DC | PRN
  Administered 2019-12-06: 1000 mL

## 2019-12-06 MED ORDER — ACETAMINOPHEN 325 MG PO TABS: 325.0000 mg | ORAL_TABLET | Freq: Four times a day (QID) | ORAL | Status: DC | PRN

## 2019-12-06 MED ORDER — PHENYLEPHRINE 40 MCG/ML (10ML) SYRINGE FOR IV PUSH (FOR BLOOD PRESSURE SUPPORT)
PREFILLED_SYRINGE | INTRAVENOUS | Status: DC | PRN
  Administered 2019-12-06 (×3): 80 ug via INTRAVENOUS

## 2019-12-06 MED ORDER — RIVAROXABAN 10 MG PO TABS
10.0000 mg | ORAL_TABLET | Freq: Every day | ORAL | Status: DC
  Administered 2019-12-07: 10 mg via ORAL
  Filled 2019-12-06: qty 1

## 2019-12-06 MED ORDER — UMECLIDINIUM BROMIDE 62.5 MCG/INH IN AEPB
1.0000 | INHALATION_SPRAY | Freq: Every day | RESPIRATORY_TRACT | Status: DC
  Filled 2019-12-06: qty 7

## 2019-12-06 MED ORDER — METOCLOPRAMIDE HCL 5 MG/ML IJ SOLN: 5.0000 mg | Freq: Three times a day (TID) | INTRAMUSCULAR | Status: DC | PRN

## 2019-12-06 MED ORDER — CHLORHEXIDINE GLUCONATE 0.12 % MT SOLN
15.0000 mL | Freq: Once | OROMUCOSAL | Status: AC
  Administered 2019-12-06: 15 mL via OROMUCOSAL

## 2019-12-06 MED ORDER — STERILE WATER FOR IRRIGATION IR SOLN
Status: DC | PRN
  Administered 2019-12-06: 1000 mL

## 2019-12-06 MED ORDER — ROPIVACAINE HCL 7.5 MG/ML IJ SOLN
INTRAMUSCULAR | Status: DC | PRN
  Administered 2019-12-06: 20 mL via PERINEURAL

## 2019-12-06 MED ORDER — TRANEXAMIC ACID-NACL 1000-0.7 MG/100ML-% IV SOLN
1000.0000 mg | INTRAVENOUS | Status: AC
  Administered 2019-12-06: 1000 mg via INTRAVENOUS
  Filled 2019-12-06: qty 100

## 2019-12-06 MED ORDER — SODIUM CHLORIDE (PF) 0.9 % IJ SOLN
INTRAMUSCULAR | Status: AC
  Filled 2019-12-06: qty 50

## 2019-12-06 MED ORDER — METHOCARBAMOL 500 MG IVPB - SIMPLE MED
500.0000 mg | Freq: Four times a day (QID) | INTRAVENOUS | Status: DC | PRN
  Filled 2019-12-06: qty 50

## 2019-12-06 MED ORDER — PROPOFOL 500 MG/50ML IV EMUL
INTRAVENOUS | Status: DC | PRN
  Administered 2019-12-06 (×2): 20 mg via INTRAVENOUS
  Administered 2019-12-06: 75 ug/kg/min via INTRAVENOUS

## 2019-12-06 MED ORDER — SODIUM CHLORIDE (PF) 0.9 % IJ SOLN
INTRAMUSCULAR | Status: DC | PRN
  Administered 2019-12-06: 60 mL

## 2019-12-06 MED ORDER — FLUTICASONE-UMECLIDIN-VILANT 100-62.5-25 MCG/INH IN AEPB: 1.0000 | INHALATION_SPRAY | Freq: Every day | RESPIRATORY_TRACT | Status: DC

## 2019-12-06 MED ORDER — DIPHENHYDRAMINE HCL 12.5 MG/5ML PO ELIX: 12.5000 mg | ORAL_SOLUTION | ORAL | Status: DC | PRN

## 2019-12-06 MED ORDER — GABAPENTIN 300 MG PO CAPS
600.0000 mg | ORAL_CAPSULE | Freq: Three times a day (TID) | ORAL | Status: DC
  Administered 2019-12-06 – 2019-12-07 (×4): 600 mg via ORAL
  Filled 2019-12-06 (×4): qty 2

## 2019-12-06 MED ORDER — FENTANYL CITRATE (PF) 100 MCG/2ML IJ SOLN
50.0000 ug | INTRAMUSCULAR | Status: DC
  Administered 2019-12-06: 50 ug via INTRAVENOUS
  Filled 2019-12-06: qty 2

## 2019-12-06 MED ORDER — 0.9 % SODIUM CHLORIDE (POUR BTL) OPTIME
TOPICAL | Status: DC | PRN
  Administered 2019-12-06: 1000 mL

## 2019-12-06 MED ORDER — BUPIVACAINE IN DEXTROSE 0.75-8.25 % IT SOLN
INTRATHECAL | Status: DC | PRN
  Administered 2019-12-06: 2 mL via INTRATHECAL

## 2019-12-06 MED ORDER — METOCLOPRAMIDE HCL 5 MG PO TABS: 5.0000 mg | ORAL_TABLET | Freq: Three times a day (TID) | ORAL | Status: DC | PRN

## 2019-12-06 MED ORDER — BUPIVACAINE LIPOSOME 1.3 % IJ SUSP
INTRAMUSCULAR | Status: DC | PRN
  Administered 2019-12-06: 20 mL

## 2019-12-06 MED ORDER — ACETAMINOPHEN 10 MG/ML IV SOLN
1000.0000 mg | Freq: Four times a day (QID) | INTRAVENOUS | Status: DC
  Administered 2019-12-06: 1000 mg via INTRAVENOUS
  Filled 2019-12-06: qty 100

## 2019-12-06 MED ORDER — SODIUM CHLORIDE (PF) 0.9 % IJ SOLN
INTRAMUSCULAR | Status: AC
  Filled 2019-12-06: qty 10

## 2019-12-06 MED ORDER — ONDANSETRON HCL 4 MG/2ML IJ SOLN: 4.0000 mg | Freq: Once | INTRAMUSCULAR | Status: DC | PRN

## 2019-12-06 MED ORDER — MENTHOL 3 MG MT LOZG: 1.0000 | LOZENGE | OROMUCOSAL | Status: DC | PRN

## 2019-12-06 MED ORDER — CEFAZOLIN SODIUM-DEXTROSE 2-4 GM/100ML-% IV SOLN
2.0000 g | Freq: Four times a day (QID) | INTRAVENOUS | Status: AC
  Administered 2019-12-06 (×2): 2 g via INTRAVENOUS
  Filled 2019-12-06 (×2): qty 100

## 2019-12-06 MED ORDER — TRAMADOL HCL 50 MG PO TABS
50.0000 mg | ORAL_TABLET | Freq: Four times a day (QID) | ORAL | Status: DC | PRN
  Administered 2019-12-07: 100 mg via ORAL
  Filled 2019-12-06: qty 2

## 2019-12-06 MED ORDER — CHOLESTYRAMINE LIGHT 4 G PO PACK
4.0000 g | PACK | Freq: Every day | ORAL | Status: DC
  Filled 2019-12-06: qty 1

## 2019-12-06 MED ORDER — MEPERIDINE HCL 50 MG/ML IJ SOLN: 6.2500 mg | INTRAMUSCULAR | Status: DC | PRN

## 2019-12-06 MED ORDER — ORAL CARE MOUTH RINSE: 15.0000 mL | Freq: Once | OROMUCOSAL | Status: AC

## 2019-12-06 MED ORDER — BISACODYL 10 MG RE SUPP: 10.0000 mg | Freq: Every day | RECTAL | Status: DC | PRN

## 2019-12-06 MED ORDER — PHENOL 1.4 % MT LIQD: 1.0000 | OROMUCOSAL | Status: DC | PRN

## 2019-12-06 SURGICAL SUPPLY — 61 items
BAG SPEC THK2 15X12 ZIP CLS (MISCELLANEOUS) ×1
BAG ZIPLOCK 12X15 (MISCELLANEOUS) ×3 IMPLANT
BLADE SAG 18X100X1.27 (BLADE) ×3 IMPLANT
BLADE SAW SGTL 11.0X1.19X90.0M (BLADE) ×3 IMPLANT
BLADE SURG SZ10 CARB STEEL (BLADE) ×6 IMPLANT
BNDG CMPR MED 15X6 ELC VLCR LF (GAUZE/BANDAGES/DRESSINGS) ×1
BNDG ELASTIC 6X15 VLCR STRL LF (GAUZE/BANDAGES/DRESSINGS) ×3 IMPLANT
BNDG ELASTIC 6X5.8 VLCR STR LF (GAUZE/BANDAGES/DRESSINGS) ×3 IMPLANT
BOWL SMART MIX CTS (DISPOSABLE) ×3 IMPLANT
CEMENT HV SMART SET (Cement) ×6 IMPLANT
CEMENT TIBIA MBT SIZE 4 (Knees) ×1 IMPLANT
CLOSURE STERI-STRIP 1/2X4 (GAUZE/BANDAGES/DRESSINGS) ×1
CLOSURE WOUND 1/2 X4 (GAUZE/BANDAGES/DRESSINGS) ×2
CLSR STERI-STRIP ANTIMIC 1/2X4 (GAUZE/BANDAGES/DRESSINGS) ×2 IMPLANT
COVER SURGICAL LIGHT HANDLE (MISCELLANEOUS) ×3 IMPLANT
COVER WAND RF STERILE (DRAPES) IMPLANT
CUFF TOURN SGL QUICK 34 (TOURNIQUET CUFF) ×3
CUFF TRNQT CYL 34X4.125X (TOURNIQUET CUFF) ×1 IMPLANT
DECANTER SPIKE VIAL GLASS SM (MISCELLANEOUS) ×3 IMPLANT
DRAPE U-SHAPE 47X51 STRL (DRAPES) ×3 IMPLANT
DRSG AQUACEL AG ADV 3.5X10 (GAUZE/BANDAGES/DRESSINGS) ×3 IMPLANT
DRSG AQUACEL AG ADV 3.5X14 (GAUZE/BANDAGES/DRESSINGS) ×3 IMPLANT
DURAPREP 26ML APPLICATOR (WOUND CARE) ×3 IMPLANT
ELECT REM PT RETURN 15FT ADLT (MISCELLANEOUS) ×3 IMPLANT
FEMUR SIGMA PS SZ 4.0N R (Femur) ×3 IMPLANT
GLOVE BIO SURGEON STRL SZ7 (GLOVE) ×3 IMPLANT
GLOVE BIO SURGEON STRL SZ8 (GLOVE) ×3 IMPLANT
GLOVE BIOGEL PI IND STRL 7.0 (GLOVE) ×1 IMPLANT
GLOVE BIOGEL PI IND STRL 8 (GLOVE) ×1 IMPLANT
GLOVE BIOGEL PI INDICATOR 7.0 (GLOVE) ×2
GLOVE BIOGEL PI INDICATOR 8 (GLOVE) ×2
GOWN STRL REUS W/TWL LRG LVL3 (GOWN DISPOSABLE) ×6 IMPLANT
HANDPIECE INTERPULSE COAX TIP (DISPOSABLE) ×3
HOLDER FOLEY CATH W/STRAP (MISCELLANEOUS) IMPLANT
IMMOBILIZER KNEE 20 (SOFTGOODS) ×3
IMMOBILIZER KNEE 20 THIGH 36 (SOFTGOODS) ×1 IMPLANT
KIT TURNOVER KIT A (KITS) IMPLANT
MANIFOLD NEPTUNE II (INSTRUMENTS) ×3 IMPLANT
NS IRRIG 1000ML POUR BTL (IV SOLUTION) ×3 IMPLANT
PACK TOTAL KNEE CUSTOM (KITS) ×3 IMPLANT
PADDING CAST COTTON 6X4 STRL (CAST SUPPLIES) ×6 IMPLANT
PADDING CAST SYN 6 (CAST SUPPLIES) ×2
PADDING CAST SYNTHETIC 6X4 NS (CAST SUPPLIES) ×1 IMPLANT
PATELLA DOME PFC 38MM (Knees) ×3 IMPLANT
PENCIL SMOKE EVACUATOR (MISCELLANEOUS) IMPLANT
PIN DRILL FIX HALF THREAD (BIT) ×3 IMPLANT
PIN STEINMAN FIXATION KNEE (PIN) ×3 IMPLANT
PLATE ROT INSERT 12.5MM SIZE 4 (Plate) ×3 IMPLANT
PROTECTOR NERVE ULNAR (MISCELLANEOUS) ×3 IMPLANT
SET HNDPC FAN SPRY TIP SCT (DISPOSABLE) ×1 IMPLANT
STRIP CLOSURE SKIN 1/2X4 (GAUZE/BANDAGES/DRESSINGS) ×4 IMPLANT
SUT MNCRL AB 4-0 PS2 18 (SUTURE) ×3 IMPLANT
SUT STRATAFIX 0 PDS 27 VIOLET (SUTURE) ×3
SUT VIC AB 2-0 CT1 27 (SUTURE) ×9
SUT VIC AB 2-0 CT1 TAPERPNT 27 (SUTURE) ×3 IMPLANT
SUTURE STRATFX 0 PDS 27 VIOLET (SUTURE) ×1 IMPLANT
TIBIA MBT CEMENT SIZE 4 (Knees) ×3 IMPLANT
TRAY FOLEY MTR SLVR 16FR STAT (SET/KITS/TRAYS/PACK) ×3 IMPLANT
WATER STERILE IRR 1000ML POUR (IV SOLUTION) ×6 IMPLANT
WRAP KNEE MAXI GEL POST OP (GAUZE/BANDAGES/DRESSINGS) ×3 IMPLANT
YANKAUER SUCT BULB TIP 10FT TU (MISCELLANEOUS) ×3 IMPLANT

## 2019-12-06 NOTE — Plan of Care (Signed)

## 2019-12-06 NOTE — Progress Notes (Signed)
AssistedDr. Ossey with right, ultrasound guided, adductor canal block. Side rails up, monitors on throughout procedure. See vital signs in flow sheet. Tolerated Procedure well.  

## 2019-12-06 NOTE — Op Note (Addendum)
OPERATIVE REPORT-TOTAL KNEE ARTHROPLASTY   Pre-operative diagnosis- Osteoarthritis  Right knee(s)  Post-operative diagnosis- Osteoarthritis Right knee(s)  Procedure-  Right  Total Knee Arthroplasty  Surgeon- Dione Plover. Darris Carachure, MD  Assistant- Molli Barrows, PA-C   Anesthesia-  Adductor canal block and spinal  EBL- 25 ml   Drains None  Tourniquet time-  Total Tourniquet Time Documented: Thigh (Right) - 39 minutes Total: Thigh (Right) - 39 minutes     Complications- None  Condition-PACU - hemodynamically stable.   Brief Clinical Note  Christy Stephenson is a 79 y.o. year old female with end stage OA of her right knee with progressively worsening pain and dysfunction. She has constant pain, with activity and at rest and significant functional deficits with difficulties even with ADLs. She has had extensive non-op management including analgesics, injections of cortisone and viscosupplements, and home exercise program, but remains in significant pain with significant dysfunction.Radiographs show bone on bone arthritis medial and patellofemoral. She presents now for right Total Knee Arthroplasty.    Procedure in detail---   The patient is brought into the operating room and positioned supine on the operating table. After successful administration of  Adductor canal block and spinal,   a tourniquet is placed high on the  Right thigh(s) and the lower extremity is prepped and draped in the usual sterile fashion. Time out is performed by the operating team and then the  Right lower extremity is wrapped in Esmarch, knee flexed and the tourniquet inflated to 300 mmHg.       A midline incision is made with a ten blade through the subcutaneous tissue to the level of the extensor mechanism. A fresh blade is used to make a medial parapatellar arthrotomy. Soft tissue over the proximal medial tibia is subperiosteally elevated to the joint line with a knife and into the semimembranosus bursa with a Cobb  elevator. Soft tissue over the proximal lateral tibia is elevated with attention being paid to avoiding the patellar tendon on the tibial tubercle. The patella is everted, knee flexed 90 degrees and the ACL and PCL are removed. Findings are bone on bone medial and patellofemoral with large global osteophytes        The drill is used to create a starting hole in the distal femur and the canal is thoroughly irrigated with sterile saline to remove the fatty contents. The 5 degree Right  valgus alignment guide is placed into the femoral canal and the distal femoral cutting block is pinned to remove 10 mm off the distal femur. Resection is made with an oscillating saw.      The tibia is subluxed forward and the menisci are removed. The extramedullary alignment guide is placed referencing proximally at the medial aspect of the tibial tubercle and distally along the second metatarsal axis and tibial crest. The block is pinned to remove 8mm off the more deficient medial  side. Resection is made with an oscillating saw. Size 4is the most appropriate size for the tibia and the proximal tibia is prepared with the modular drill and keel punch for that size.      The femoral sizing guide is placed and size 4 is most appropriate. Rotation is marked off the epicondylar axis and confirmed by creating a rectangular flexion gap at 90 degrees. The size 4 cutting block is pinned in this rotation and the anterior, posterior and chamfer cuts are made with the oscillating saw. The intercondylar block is then placed and that cut is made.  Trial size 4 tibial component, trial size 4 narrow posterior stabilized femur and a 12.5  mm posterior stabilized rotating platform insert trial is placed. Full extension is achieved with excellent varus/valgus and anterior/posterior balance throughout full range of motion. The patella is everted and thickness measured to be 22  mm. Free hand resection is taken to 12 mm, a 38 template is placed,  lug holes are drilled, trial patella is placed, and it tracks normally. Osteophytes are removed off the posterior femur with the trial in place. All trials are removed and the cut bone surfaces prepared with pulsatile lavage. Cement is mixed and once ready for implantation, the size 4 tibial implant, size  4 posterior stabilized femoral component, and the size 38 patella are cemented in place and the patella is held with the clamp. The trial insert is placed and the knee held in full extension. The Exparel (20 ml mixed with 60 ml saline) is injected into the extensor mechanism, posterior capsule, medial and lateral gutters and subcutaneous tissues.  All extruded cement is removed and once the cement is hard the permanent 12.5 mm posterior stabilized rotating platform insert is placed into the tibial tray.      The wound is copiously irrigated with saline solution and the extensor mechanism closed  with #1 V-loc suture. The tourniquet is released for a total tourniquet time of 39  minutes. Flexion against gravity is 140 degrees and the patella tracks normally. Subcutaneous tissue is closed with 2.0 vicryl and subcuticular with running 4.0 Monocryl. The incision is cleaned and dried and steri-strips and a bulky sterile dressing are applied. The limb is placed into a knee immobilizer and the patient is awakened and transported to recovery in stable condition.      Please note that a surgical assistant was a medical necessity for this procedure in order to perform it in a safe and expeditious manner. Surgical assistant was necessary to retract the ligaments and vital neurovascular structures to prevent injury to them and also necessary for proper positioning of the limb to allow for anatomic placement of the prosthesis.   Dione Plover Aiyah Scarpelli, MD    12/06/2019, 12:53 PM

## 2019-12-06 NOTE — Discharge Instructions (Addendum)
Information on my medicine - XARELTO (Rivaroxaban)  This medication education was reviewed with me or my healthcare representative as part of my discharge preparation.  Why was Xarelto prescribed for you? Xarelto was prescribed for you to reduce the risk of blood clots forming after orthopedic surgery. The medical term for these abnormal blood clots is venous thromboembolism (VTE).  What do you need to know about xarelto ? Take your Xarelto ONCE DAILY at the same time every day. You may take it either with or without food.  If you have difficulty swallowing the tablet whole, you may crush it and mix in applesauce just prior to taking your dose.  Take Xarelto exactly as prescribed by your doctor and DO NOT stop taking Xarelto without talking to the doctor who prescribed the medication.  Stopping without other VTE prevention medication to take the place of Xarelto may increase your risk of developing a clot.  After discharge, you should have regular check-up appointments with your healthcare provider that is prescribing your Xarelto.    What do you do if you miss a dose? If you miss a dose, take it as soon as you remember on the same day then continue your regularly scheduled once daily regimen the next day. Do not take two doses of Xarelto on the same day.   Important Safety Information A possible side effect of Xarelto is bleeding. You should call your healthcare provider right away if you experience any of the following: ? Bleeding from an injury or your nose that does not stop. ? Unusual colored urine (red or dark brown) or unusual colored stools (red or black). ? Unusual bruising for unknown reasons. ? A serious fall or if you hit your head (even if there is no bleeding).  Some medicines may interact with Xarelto and might increase your risk of bleeding while on Xarelto. To help avoid this, consult your healthcare provider or pharmacist prior to using any new prescription or  non-prescription medications, including herbals, vitamins, non-steroidal anti-inflammatory drugs (NSAIDs) and supplements.  This website has more information on Xarelto: www.xarelto.com.     Frank Aluisio, MD Total Joint Specialist [facility] [place] [address] [place], [place] [address] [phone]  TOTAL KNEE REPLACEMENT POSTOPERATIVE DIRECTIONS    Knee Rehabilitation, Guidelines Following Surgery  Results after knee surgery are often greatly improved when you follow the exercise, range of motion and muscle strengthening exercises prescribed by your doctor. Safety measures are also important to protect the knee from further injury. If any of these exercises cause you to have increased pain or swelling in your knee joint, decrease the amount until you are comfortable again and slowly increase them. If you have problems or questions, call your caregiver or physical therapist for advice.   BLOOD CLOT PREVENTION . Take a 10 mg Xarelto once a day for three weeks following surgery. Then take an 81 mg Aspirin once a day for three weeks. Then discontinue Aspirin. . You may resume your vitamins/supplements once you have discontinued the Xarelto. . Do not take any NSAIDs (Advil, Aleve, Ibuprofen, Meloxicam, etc.) until you have discontinued the Xarelto.   HOME CARE INSTRUCTIONS  . Remove items at home which could result in a fall. This includes throw rugs or furniture in walking pathways.  . ICE to the affected knee as much as tolerated. Icing helps control swelling. If the swelling is well controlled you will be more comfortable and rehab easier. Continue to use ice on the knee   for pain and swelling from surgery. You may notice swelling that will progress down to the foot and ankle. This is normal after surgery. Elevate the leg when you are not up walking on it.    . Continue to use the breathing machine which will help keep your temperature down. It is common for your  temperature to cycle up and down following surgery, especially at night when you are not up moving around and exerting yourself. The breathing machine keeps your lungs expanded and your temperature down. . Do not place pillow under the operative knee, focus on keeping the knee straight while resting  DIET You may resume your previous home diet once you are discharged from the hospital.  DRESSING / WOUND CARE / SHOWERING . Keep your bulky bandage on for 2 days. On the third post-operative day you may remove the Ace bandage and gauze. There is a waterproof adhesive bandage on your skin which will stay in place until your first follow-up appointment. Once you remove this you will not need to place another bandage . You may begin showering 3 days following surgery, but do not submerge the incision under water.  ACTIVITY For the first 5 days, the key is rest and control of pain and swelling . Do your home exercises twice a day starting on post-operative day 3. On the days you go to physical therapy, just do the home exercises once that day. . You should rest, ice and elevate the leg for 50 minutes out of every hour. Get up and walk/stretch for 10 minutes per hour. After 5 days you can increase your activity slowly as tolerated. . Walk with your walker as instructed. Use the walker until you are comfortable transitioning to a cane. Walk with the cane in the opposite hand of the operative leg. You may discontinue the cane once you are comfortable and walking steadily. . Avoid periods of inactivity such as sitting longer than an hour when not asleep. This helps prevent blood clots.  . You may discontinue the knee immobilizer once you are able to perform a straight leg raise while lying down. . You may resume a sexual relationship in one month or when given the OK by your doctor.  . You may return to work once you are cleared by your doctor.  . Do not drive a car for 6 weeks or until released by your  surgeon.  . Do not drive while taking narcotics.  TED HOSE STOCKINGS Wear the elastic stockings on both legs for three weeks following surgery during the day. You may remove them at night for sleeping.  WEIGHT BEARING Weight bearing as tolerated with assist device (walker, cane, etc) as directed, use it as long as suggested by your surgeon or therapist, typically at least 4-6 weeks.  POSTOPERATIVE CONSTIPATION PROTOCOL Constipation - defined medically as fewer than three stools per week and severe constipation as less than one stool per week.  One of the most common issues patients have following surgery is constipation.  Even if you have a regular bowel pattern at home, your normal regimen is likely to be disrupted due to multiple reasons following surgery.  Combination of anesthesia, postoperative narcotics, change in appetite and fluid intake all can affect your bowels.  In order to avoid complications following surgery, here are some recommendations in order to help you during your recovery period.  . Colace (docusate) - Pick up an over-the-counter form of Colace or another stool softener and take twice   a day as long as you are requiring postoperative pain medications.  Take with a full glass of water daily.  If you experience loose stools or diarrhea, hold the colace until you stool forms back up. If your symptoms do not get better within 1 week or if they get worse, check with your doctor. . Dulcolax (bisacodyl) - Pick up over-the-counter and take as directed by the product packaging as needed to assist with the movement of your bowels.  Take with a full glass of water.  Use this product as needed if not relieved by Colace only.  . MiraLax (polyethylene glycol) - Pick up over-the-counter to have on hand. MiraLax is a solution that will increase the amount of water in your bowels to assist with bowel movements.  Take as directed and can mix with a glass of water, juice, soda, coffee, or tea.  Take if you go more than two days without a movement. Do not use MiraLax more than once per day. Call your doctor if you are still constipated or irregular after using this medication for 7 days in a row.  If you continue to have problems with postoperative constipation, please contact the office for further assistance and recommendations.  If you experience "the worst abdominal pain ever" or develop nausea or vomiting, please contact the office immediatly for further recommendations for treatment.  ITCHING If you experience itching with your medications, try taking only a single pain pill, or even half a pain pill at a time.  You can also use Benadryl over the counter for itching or also to help with sleep.   MEDICATIONS See your medication summary on the "After Visit Summary" that the nursing staff will review with you prior to discharge.  You may have some home medications which will be placed on hold until you complete the course of blood thinner medication.  It is important for you to complete the blood thinner medication as prescribed by your surgeon.  Continue your approved medications as instructed at time of discharge.  PRECAUTIONS . If you experience chest pain or shortness of breath - call 911 immediately for transfer to the hospital emergency department.  . If you develop a fever greater that 101 F, purulent drainage from wound, increased redness or drainage from wound, foul odor from the wound/dressing, or calf pain - CONTACT YOUR SURGEON.                                                   FOLLOW-UP APPOINTMENTS Make sure you keep all of your appointments after your operation with your surgeon and caregivers. You should call the office at the above phone number and make an appointment for approximately two weeks after the date of your surgery or on the date instructed by your surgeon outlined in the "After Visit Summary".  RANGE OF MOTION AND STRENGTHENING EXERCISES  Rehabilitation of  the knee is important following a knee injury or an operation. After just a few days of immobilization, the muscles of the thigh which control the knee become weakened and shrink (atrophy). Knee exercises are designed to build up the tone and strength of the thigh muscles and to improve knee motion. Often times heat used for twenty to thirty minutes before working out will loosen up your tissues and help with improving the range of motion but   do not use heat for the first two weeks following surgery. These exercises can be done on a training (exercise) mat, on the floor, on a table or on a bed. Use what ever works the best and is most comfortable for you Knee exercises include:  . Leg Lifts - While your knee is still immobilized in a splint or cast, you can do straight leg raises. Lift the leg to 60 degrees, hold for 3 sec, and slowly lower the leg. Repeat 10-20 times 2-3 times daily. Perform this exercise against resistance later as your knee gets better.  . Quad and Hamstring Sets - Tighten up the muscle on the front of the thigh (Quad) and hold for 5-10 sec. Repeat this 10-20 times hourly. Hamstring sets are done by pushing the foot backward against an object and holding for 5-10 sec. Repeat as with quad sets.   Leg Slides: Lying on your back, slowly slide your foot toward your buttocks, bending your knee up off the floor (only go as far as is comfortable). Then slowly slide your foot back down until your leg is flat on the floor again.  Angel Wings: Lying on your back spread your legs to the side as far apart as you can without causing discomfort.  A rehabilitation program following serious knee injuries can speed recovery and prevent re-injury in the future due to weakened muscles. Contact your doctor or a physical therapist for more information on knee rehabilitation.   IF YOU ARE TRANSFERRED TO A SKILLED REHAB FACILITY If the patient is transferred to a skilled rehab facility following release from  the hospital, a list of the current medications will be sent to the facility for the patient to continue.  When discharged from the skilled rehab facility, please have the facility set up the patient's Home Health Physical Therapy prior to being released. Also, the skilled facility will be responsible for providing the patient with their medications at time of release from the facility to include their pain medication, the muscle relaxants, and their blood thinner medication. If the patient is still at the rehab facility at time of the two week follow up appointment, the skilled rehab facility will also need to assist the patient in arranging follow up appointment in our office and any transportation needs.  MAKE SURE YOU:  . Understand these instructions.  . Get help right away if you are not doing well or get worse.   DENTAL ANTIBIOTICS:  In most cases prophylactic antibiotics for Dental procdeures after total joint surgery are not necessary.  Exceptions are as follows:  1. History of prior total joint infection  2. Severely immunocompromised (Organ Transplant, cancer chemotherapy, Rheumatoid biologic meds such as Humera)  3. Poorly controlled diabetes (A1C &gt; 8.0, blood glucose over 200)  If you have one of these conditions, contact your surgeon for an antibiotic prescription, prior to your dental procedure.    Pick up stool softner and laxative for home use following surgery while on pain medications. Do not submerge incision under water. Please use good hand washing techniques while changing dressing each day. May shower starting three days after surgery. Please use a clean towel to pat the incision dry following showers. Continue to use ice for pain and swelling after surgery. Do not use any lotions or creams on the incision until instructed by your surgeon.  

## 2019-12-06 NOTE — Transfer of Care (Signed)
Immediate Anesthesia Transfer of Care Note  Patient: Christy Stephenson  Procedure(s) Performed: Procedure(s) with comments: TOTAL KNEE ARTHROPLASTY (Right) - 71min  Patient Location: PACU  Anesthesia Type:Spinal  Level of Consciousness: awake, alert  and oriented  Airway & Oxygen Therapy: Patient Spontanous Breathing  Post-op Assessment: Report given to RN and Post -op Vital signs reviewed and stable  Post vital signs: Reviewed and stable  Last Vitals:  Vitals:   12/06/19 1054 12/06/19 1109  BP: 129/68 123/71  Pulse: 63 (!) 59  Resp: 12 13  Temp:    SpO2: 76% 15%    Complications: No apparent anesthesia complications

## 2019-12-06 NOTE — Anesthesia Procedure Notes (Signed)
Anesthesia Regional Block: Adductor canal block   Pre-Anesthetic Checklist: ,, timeout performed, Correct Patient, Correct Site, Correct Laterality, Correct Procedure, Correct Position, site marked, Risks and benefits discussed,  Surgical consent,  Pre-op evaluation,  At surgeon's request and post-op pain management  Laterality: Right  Prep: chloraprep       Needles:  Injection technique: Single-shot  Needle Type: Echogenic Stimulator Needle     Needle Length: 9cm  Needle Gauge: 21     Additional Needles:   Narrative:  Start time: 12/06/2019 10:26 AM End time: 12/06/2019 10:36 AM Injection made incrementally with aspirations every 5 mL.  Performed by: Personally  Anesthesiologist: Lillia Abed, MD  Additional Notes: Monitors applied. Patient sedated. Sterile prep and drape,hand hygiene and sterile gloves were used. Relevant anatomy identified.Needle position confirmed.Local anesthetic injected incrementally after negative aspiration. Local anesthetic spread visualized around nerve(s). Vascular puncture avoided. No complications. Image printed for medical record.The patient tolerated the procedure well.    Lillia Abed MD

## 2019-12-06 NOTE — Anesthesia Preprocedure Evaluation (Signed)
Anesthesia Evaluation  Patient identified by MRN, date of birth, ID band Patient awake    Reviewed: Allergy & Precautions, NPO status , Patient's Chart, lab work & pertinent test results  History of Anesthesia Complications (+) PONV  Airway Mallampati: I  TM Distance: >3 FB Neck ROM: Full    Dental   Pulmonary    Pulmonary exam normal        Cardiovascular Normal cardiovascular exam     Neuro/Psych    GI/Hepatic GERD  Medicated and Controlled,  Endo/Other    Renal/GU      Musculoskeletal   Abdominal   Peds  Hematology   Anesthesia Other Findings   Reproductive/Obstetrics                             Anesthesia Physical Anesthesia Plan  ASA: III  Anesthesia Plan: Spinal   Post-op Pain Management:  Regional for Post-op pain   Induction:   PONV Risk Score and Plan: 3 and Ondansetron and Treatment may vary due to age or medical condition  Airway Management Planned: Nasal Cannula  Additional Equipment:   Intra-op Plan:   Post-operative Plan:   Informed Consent: I have reviewed the patients History and Physical, chart, labs and discussed the procedure including the risks, benefits and alternatives for the proposed anesthesia with the patient or authorized representative who has indicated his/her understanding and acceptance.       Plan Discussed with: CRNA and Surgeon  Anesthesia Plan Comments:         Anesthesia Quick Evaluation

## 2019-12-06 NOTE — Anesthesia Procedure Notes (Signed)
Spinal  Patient location during procedure: OR Start time: 12/06/2019 11:37 AM End time: 12/06/2019 11:41 AM Staffing Performed: anesthesiologist  Anesthesiologist: Lillia Abed, MD Preanesthetic Checklist Completed: patient identified, IV checked, risks and benefits discussed, surgical consent, monitors and equipment checked, pre-op evaluation and timeout performed Spinal Block Patient position: sitting Prep: DuraPrep Patient monitoring: blood pressure, continuous pulse ox, cardiac monitor and heart rate Approach: midline Location: L3-4 Injection technique: single-shot Needle Needle type: Pencan  Needle gauge: 24 G Needle length: 9 cm Needle insertion depth: 7 cm

## 2019-12-06 NOTE — Anesthesia Postprocedure Evaluation (Signed)
Anesthesia Post Note  Patient: Christy Stephenson  Procedure(s) Performed: TOTAL KNEE ARTHROPLASTY (Right Knee)     Patient location during evaluation: PACU Anesthesia Type: Spinal Level of consciousness: oriented and awake and alert Pain management: pain level controlled Vital Signs Assessment: post-procedure vital signs reviewed and stable Respiratory status: spontaneous breathing, respiratory function stable and patient connected to nasal cannula oxygen Cardiovascular status: blood pressure returned to baseline and stable Postop Assessment: no headache, no backache and no apparent nausea or vomiting Anesthetic complications: no   No complications documented.  Last Vitals:  Vitals:   12/06/19 1610 12/06/19 1722  BP: 134/69 (!) 146/69  Pulse: 64 77  Resp: 16 17  Temp: 36.6 C   SpO2: 97% 100%    Last Pain:  Vitals:   12/06/19 1610  TempSrc: Oral  PainSc:                  Christy Stephenson DAVID

## 2019-12-06 NOTE — Interval H&P Note (Signed)
History and Physical Interval Note:  12/06/2019 9:28 AM  Christy Stephenson  has presented today for surgery, with the diagnosis of right knee osteoarthritis.  The various methods of treatment have been discussed with the patient and family. After consideration of risks, benefits and other options for treatment, the patient has consented to  Procedure(s) with comments: TOTAL KNEE ARTHROPLASTY (Right) - 27min as a surgical intervention.  The patient's history has been reviewed, patient examined, no change in status, stable for surgery.  I have reviewed the patient's chart and labs.  Questions were answered to the patient's satisfaction.     Pilar Plate Shantice Menger

## 2019-12-06 NOTE — Evaluation (Signed)
Physical Therapy Evaluation Patient Details Name: Christy Stephenson MRN: [id number] DOB: November 26, 1940 Today's Date: 12/06/2019   History of Present Illness  Pt s/p R TKR and with hx fo TIA, COPD, lumbar fusion and Peripheral Neuropathy  Clinical Impression  Pt s/p R TKR and presents with decreased R LE strength/ROM and post op pain limiting functional mobility.  Pt should progress to dc home with family assist. Pt reports first OP PT appt is Thursday 12/09/19.    Follow Up Recommendations Follow surgeon's recommendation for DC plan and follow-up therapies    Equipment Recommendations  None recommended by PT    Recommendations for Other Services       Precautions / Restrictions Precautions Precautions: Knee;Fall Restrictions Weight Bearing Restrictions: No Other Position/Activity Restrictions: WBAT      Mobility  Bed Mobility Overal bed mobility: Needs Assistance Bed Mobility: Supine to Sit     Supine to sit: Min assist     General bed mobility comments: Use of bedrail and assist to manage R LE  Transfers Overall transfer level: Needs assistance Equipment used: Rolling walker (2 wheeled) Transfers: Sit to/from Stand Sit to Stand: Min assist;From elevated surface         General transfer comment: cues for LE management and use of UEs to self assist  Ambulation/Gait Ambulation/Gait assistance: Min assist Gait Distance (Feet): 37 Feet Assistive device: Rolling walker (2 wheeled) Gait Pattern/deviations: Step-to pattern;Decreased step length - right;Decreased step length - left;Shuffle;Trunk flexed Gait velocity: decr   General Gait Details: cues for sequence, posture and position from [employer]            Wheelchair Mobility    Modified Rankin (Stroke Patients Only)       Balance Overall balance assessment: Needs assistance Sitting-balance support: No upper extremity supported;Feet supported Sitting balance-Leahy Scale: Good     Standing balance  support: Bilateral upper extremity supported Standing balance-Leahy Scale: Poor                               Pertinent Vitals/Pain Pain Assessment: 0-10 Pain Score: 6  Pain Location: R TKR Pain Descriptors / Indicators: Aching;Sore Pain Intervention(s): Limited activity within patient's tolerance;Monitored during session;Premedicated before session;Ice applied    Home Living Family/patient expects to be discharged to:: Private residence Living Arrangements: Spouse/significant other Available Help at Discharge: Family Type of Home: House Home Access: Stairs to enter   [employer] of Steps: 20 (has stair lift) Home Layout: One level Home Equipment: [place] - 2 wheels;Bedside commode      Prior Function Level of Independence: Independent               Hand Dominance        Extremity/Trunk Assessment   Upper Extremity Assessment Upper Extremity Assessment: Overall WFL for tasks assessed    Lower Extremity Assessment Lower Extremity Assessment: RLE deficits/detail       Communication   Communication: No difficulties  Cognition Arousal/Alertness: Awake/alert Behavior During Therapy: WFL for tasks assessed/performed Overall Cognitive Status: Within Functional Limits for tasks assessed                                        General Comments      Exercises Total Joint Exercises Ankle Circles/Pumps: AROM;Both;15 reps;Supine   Assessment/Plan    PT Assessment Patient needs continued  PT services  PT Problem List Decreased strength;Decreased range of motion;Decreased activity tolerance;Decreased balance;Decreased mobility;Decreased knowledge of use of DME;Pain       PT Treatment Interventions DME instruction;Gait training;Stair training;Functional mobility training;Therapeutic activities;Therapeutic exercise;Patient/family education;Balance training    PT Goals (Current goals can be found in the Care Plan section)   Acute Rehab PT Goals Patient Stated Goal: Regain IND PT Goal Formulation: With patient Time For Goal Achievement: 12/13/19 Potential to Achieve Goals: Good    Frequency 7X/week   Barriers to discharge        Co-evaluation               AM-PAC PT "6 Clicks" Mobility  Outcome Measure Help needed turning from your back to your side while in a flat bed without using bedrails?: A Little Help needed moving from lying on your back to sitting on the side of a flat bed without using bedrails?: A Little Help needed moving to and from a bed to a chair (including a wheelchair)?: A Little Help needed standing up from a chair using your arms (e.g., wheelchair or bedside chair)?: A Little Help needed to walk in hospital room?: A Little Help needed climbing 3-5 steps with a railing? : A Lot 6 Click Score: 17    End of Session Equipment Utilized During Treatment: Gait belt;Right knee immobilizer Activity Tolerance: Patient tolerated treatment well Patient left: in chair;with call bell/phone within reach;with nursing/sitter in room;with family/visitor present Nurse Communication: Mobility status PT Visit Diagnosis: Difficulty in walking, not elsewhere classified (R26.2)    Time: 1815-1850 PT Time Calculation (min) (ACUTE ONLY): 35 min   Charges:   PT Evaluation $PT Eval Low Complexity: 1 Low PT Treatments $Gait Training: 8-22 mins        [place] Pager [phone] Office [phone]   Carrah Eppolito 12/06/2019, 7:27 PM

## 2019-12-06 NOTE — Care Plan (Signed)
Ortho Bundle Case Management Note  Patient Details  Name: Christy Stephenson MRN: [id number] Date of Birth: [birth year]                  R TKA on 12-06-19 DCP: Home with spouse. Garage apt with 20 ste. Has a chair lift. DME: No needs. Has a RW and 3-in-1. PT: EO. PT eval scheduled on 12-09-19.   DME Arranged:  N/A DME Agency:     HH Arranged:    HH Agency:     Additional Comments: Please contact me with any questions of if this plan should need to change.  Marianne Sofia, RN,CCM [facility]  [phone] 12/06/2019, 12:29 PM

## 2019-12-06 NOTE — Progress Notes (Signed)
Orthopedic Tech Progress Note Patient Details:  Christy Stephenson 07-12-1940 [id number]  CPM Right Knee CPM Right Knee: On Right Knee Flexion (Degrees): 40 Right Knee Extension (Degrees): 10  Post Interventions Patient Tolerated: Well Instructions Provided: Care of device, Adjustment of device  Krystal Delduca 12/06/2019, 1:50 PM

## 2019-12-06 NOTE — Progress Notes (Signed)
Orthopedic Tech Progress Note Patient Details:  Christy Stephenson 09-Nov-1940 [id number]  CPM Right Knee CPM Right Knee: Off Right Knee Flexion (Degrees): 40 Right Knee Extension (Degrees): 10  Post Interventions Patient Tolerated: Well Instructions Provided: Care of device, Adjustment of device  [place] Pink 12/06/2019, 5:46 PM

## 2019-12-07 DIAGNOSIS — K219 Gastro-esophageal reflux disease without esophagitis: Secondary | ICD-10-CM | POA: Diagnosis not present

## 2019-12-07 DIAGNOSIS — E785 Hyperlipidemia, unspecified: Secondary | ICD-10-CM | POA: Diagnosis not present

## 2019-12-07 DIAGNOSIS — J449 Chronic obstructive pulmonary disease, unspecified: Secondary | ICD-10-CM | POA: Diagnosis not present

## 2019-12-07 DIAGNOSIS — G4733 Obstructive sleep apnea (adult) (pediatric): Secondary | ICD-10-CM | POA: Diagnosis not present

## 2019-12-07 DIAGNOSIS — M1711 Unilateral primary osteoarthritis, right knee: Secondary | ICD-10-CM | POA: Diagnosis not present

## 2019-12-07 DIAGNOSIS — Z79899 Other long term (current) drug therapy: Secondary | ICD-10-CM | POA: Diagnosis not present

## 2019-12-07 LAB — CBC
HCT: 39.7 % (ref 36.0–46.0)
Hemoglobin: 12.8 g/dL (ref 12.0–15.0)
MCH: 31.1 pg (ref 26.0–34.0)
MCHC: 32.2 g/dL (ref 30.0–36.0)
MCV: 96.4 fL (ref 80.0–100.0)
Platelets: 198 10*3/uL (ref 150–400)
RBC: 4.12 MIL/uL (ref 3.87–5.11)
RDW: 12.5 % (ref 11.5–15.5)
WBC: 14.7 10*3/uL — ABNORMAL HIGH (ref 4.0–10.5)
nRBC: 0 % (ref 0.0–0.2)

## 2019-12-07 LAB — BASIC METABOLIC PANEL
Anion gap: 8 (ref 5–15)
BUN: 18 mg/dL (ref 8–23)
CO2: 23 mmol/L (ref 22–32)
Calcium: 8.6 mg/dL — ABNORMAL LOW (ref 8.9–10.3)
Chloride: 107 mmol/L (ref 98–111)
Creatinine, Ser: 0.79 mg/dL (ref 0.44–1.00)
GFR calc Af Amer: >60 mL/min (ref >60)
GFR calc non Af Amer: >60 mL/min (ref >60)
Glucose, Bld: 141 mg/dL — ABNORMAL HIGH (ref 70–99)
Potassium: 4.4 mmol/L (ref 3.5–5.1)
Sodium: 138 mmol/L (ref 135–145)

## 2019-12-07 MED ORDER — TRAMADOL HCL 50 MG PO TABS: 50.0000 mg | ORAL_TABLET | Freq: Four times a day (QID) | ORAL | 0 refills | Status: DC | PRN

## 2019-12-07 MED ORDER — METHOCARBAMOL 500 MG PO TABS: 500.0000 mg | ORAL_TABLET | Freq: Four times a day (QID) | ORAL | 0 refills | Status: DC | PRN

## 2019-12-07 MED ORDER — HYDROCODONE-ACETAMINOPHEN 5-325 MG PO TABS: 1.0000 | ORAL_TABLET | ORAL | 0 refills | Status: DC | PRN

## 2019-12-07 MED ORDER — OXYCODONE HCL 5 MG PO TABS: 5.0000 mg | ORAL_TABLET | Freq: Four times a day (QID) | ORAL | 0 refills | Status: DC | PRN

## 2019-12-07 MED ORDER — RIVAROXABAN 10 MG PO TABS: 10.0000 mg | ORAL_TABLET | Freq: Every day | ORAL | 0 refills | Status: DC

## 2019-12-07 NOTE — Progress Notes (Signed)
Physical Therapy Treatment Patient Details Name: Christy Stephenson MRN: [id number] DOB: 1941/01/30 Today's Date: 12/07/2019    History of Present Illness Pt s/p R TKR and with hx fo TIA, COPD, lumbar fusion and Peripheral Neuropathy    PT Comments    POD # 1 pm session Spouse present during session.  Assisted with amb an increased distance then returned to room to complete HE following handout.  Addressed all mobility questions, discussed appropriate activity, educated on use of ICE.  Pt ready for D/C to home.   Follow Up Recommendations  Follow surgeon's recommendation for DC plan and follow-up therapies;Outpatient PT     Equipment Recommendations  None recommended by PT    Recommendations for Other Services       Precautions / Restrictions Precautions Precautions: Knee;Fall Precaution Comments: instructed no pillow under knee Restrictions Weight Bearing Restrictions: No Other Position/Activity Restrictions: WBAT    Mobility  Bed Mobility               General bed mobility comments: OOB in recliner  Transfers Overall transfer level: Needs assistance Equipment used: Rolling walker (2 wheeled) Transfers: Sit to/from Stand Sit to Stand: Supervision;Min guard         General transfer comment: cues for LE management and use of UEs to self assist  Ambulation/Gait Ambulation/Gait assistance: Supervision;Min guard Gait Distance (Feet): 57 Feet Assistive device: Rolling walker (2 wheeled) Gait Pattern/deviations: Step-to pattern;Decreased step length - right;Decreased step length - left;Shuffle;Trunk flexed Gait velocity: decr   General Gait Details: cues for sequence, posture and position from [employer] Stairs:  (no stairs)           Wheelchair Mobility    Modified Rankin (Stroke Patients Only)       Balance                                            Cognition Arousal/Alertness: Awake/alert Behavior During Therapy: WFL for  tasks assessed/performed Overall Cognitive Status: Within Functional Limits for tasks assessed                                        Exercises  seated TE's  5 reps knee bends 3 reps assisted knee bends 5 reps LAQ's Followed by ICE    General Comments        Pertinent Vitals/Pain Pain Score: 5  Pain Location: R knee Pain Descriptors / Indicators: Aching;Sore;Tender;Tightness Pain Intervention(s): Monitored during session;Premedicated before session;Repositioned;Ice applied    Home Living                      Prior Function            PT Goals (current goals can now be found in the care plan section) Progress towards PT goals: Progressing toward goals    Frequency    7X/week      PT Plan Current plan remains appropriate    Co-evaluation              AM-PAC PT "6 Clicks" Mobility   Outcome Measure  Help needed turning from your back to your side while in a flat bed without using bedrails?: A Little Help needed moving from lying on your back to sitting on the side of  a flat bed without using bedrails?: A Little Help needed moving to and from a bed to a chair (including a wheelchair)?: A Little Help needed standing up from a chair using your arms (e.g., wheelchair or bedside chair)?: A Little Help needed to walk in hospital room?: A Little Help needed climbing 3-5 steps with a railing? : A Little 6 Click Score: 18    End of Session Equipment Utilized During Treatment: Gait belt Activity Tolerance: Patient tolerated treatment well Patient left: in chair;with call bell/phone within reach;with nursing/sitter in room Nurse Communication: Mobility status PT Visit Diagnosis: Difficulty in walking, not elsewhere classified (R26.2)     Time: 1345-1410 PT Time Calculation (min) (ACUTE ONLY): 25 min  Charges:  $Gait Training: 8-22 mins $Therapeutic Activity: 8-22 mins                     Rica Koyanagi  PTA Acute  Rehabilitation  Services Pager      [phone] Office      [phone]

## 2019-12-07 NOTE — Progress Notes (Signed)
Subjective: 1 Day Post-Op Procedure(s) (LRB): TOTAL KNEE ARTHROPLASTY (Right) Patient reports pain as mild.   Patient seen in rounds by Dr. Wynelle Link. Patient is well, and has had no acute complaints or problems other than pain in the right knee. No issues overnight. Denies chest pain, SOB, or calf pain. Foley catheter to be removed this AM. We will continue therapy today, ambulated 31' yesterday.   Objective: Vital signs in last 24 hours: Temp:  [97.4 F (36.3 C)-98.6 F (37 C)] 97.9 F (36.6 C) (07/20 0617) Pulse Rate:  [54-86] 68 (07/20 0617) Resp:  [9-22] 15 (07/20 0617) BP: (100-164)/(40-90) 141/70 (07/20 0617) SpO2:  [96 %-100 %] 99 % (07/20 0617) Weight:  [99.7 kg] 99.7 kg (07/19 0944)  Intake/Output from previous day:  Intake/Output Summary (Last 24 hours) at 12/07/2019 0701 Last data filed at 12/07/2019 9480 Gross per 24 hour  Intake 2997.35 ml  Output 2045 ml  Net 952.35 ml     Intake/Output this shift: No intake/output data recorded.  Labs: Recent Labs    12/07/19 0339  HGB 12.8   Recent Labs    12/07/19 0339  WBC 14.7*  RBC 4.12  HCT 39.7  PLT 198   Recent Labs    12/07/19 0339  NA 138  K 4.4  CL 107  CO2 23  BUN 18  CREATININE 0.79  GLUCOSE 141*  CALCIUM 8.6*   No results for input(s): LABPT, INR in the last 72 hours.  Exam: General - Patient is Alert and Oriented Extremity - Neurologically intact Neurovascular intact Sensation intact distally Dorsiflexion/Plantar flexion intact Dressing - dressing C/D/I Motor Function - intact, moving foot and toes well on exam.   Past Medical History:  Diagnosis Date  . Arthritis    osteo  . Asthma   . Asthma   . Basal cell carcinoma    face  . COPD (chronic obstructive pulmonary disease) ([place])   . GERD (gastroesophageal reflux disease)   . Heart palpitations   . History of iron deficiency anemia   . History of migraine    while taking birth control pills  . Hypercholesteremia   .  Neuropathy    left greater than right feet  . Obesity   . Obstructive sleep apnea 07/09/2012   CPAP   . Peripheral neuropathy   . Pneumonia    as a child  . PONV (postoperative nausea and vomiting)    not since 1967  . Shortness of breath    with exertion  . Stroke ([place])   . TIA (transient ischemic attack) 2014  . TIA (transient ischemic attack) 2014   no residual - no follow up needed  . 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   Weight as of this encounter: 99.7 kg. Advance diet Up with therapy D/C IV fluids  Patient's anticipated LOS is less than 2 midnights, meeting these requirements: - Lives within 1 hour of care - Has a competent adult at home to recover with post-op recover - NO history of  - Chronic pain requiring opiods  - Diabetes  - Coronary Artery Disease  - Heart failure  - Heart attack  - DVT/VTE  - Cardiac arrhythmia  -  Respiratory Failure/COPD  - Renal failure  - Anemia  - Advanced Liver disease  DVT Prophylaxis - Xarelto Weight bearing as tolerated. Continue therapy.  Plan is to go Home after hospital stay. Plan for discharge later today if progresses with therapy and meeting her goals. Scheduled for OPPT at [facility] Follow-up in the office August 3rd.  The PDMP database was reviewed today (12/07/2019) prior to any opioid medications being prescribed to this patient.   Theresa Duty, PA-C Orthopedic Surgery [phone] 12/07/2019, 7:01 AM

## 2019-12-07 NOTE — Plan of Care (Signed)
Plan of care reviewed and discussed with the patient. 

## 2019-12-07 NOTE — TOC Transition Note (Signed)
Transition of Care [facility]) - CM/SW Discharge Note   Patient Details  Name: Christy Stephenson MRN: [id number] Date of Birth: June 19, 1940  [facility] CM/SW Contact:  Lennart Pall, LCSW Phone Number: 12/07/2019, 10:00 AM   Clinical Narrative:   Note that pt is ortho bundle.  OPPT at [facility] already arranged.  No DME needs.          Patient Goals and CMS Choice        Discharge Placement                       Discharge Plan and Services                DME Arranged: N/A                    Social Determinants of Health (SDOH) Interventions     Readmission Risk Interventions No flowsheet data found.

## 2019-12-07 NOTE — Progress Notes (Signed)
Physical Therapy Treatment Patient Details Name: Christy Stephenson MRN: [id number] DOB: 02/26/1941 Today's Date: 12/07/2019    History of Present Illness Pt s/p R TKR and with hx fo TIA, COPD, lumbar fusion and Peripheral Neuropathy    PT Comments    POD # 1 am session Pt OOB in recliner.  Assisted with amb Then returned to room to perform some TE's following HEP handout.  Instructed on proper tech, freq as well as use of ICE.     Follow Up Recommendations  Follow surgeon's recommendation for DC plan and follow-up therapies;Outpatient PT     Equipment Recommendations  None recommended by PT    Recommendations for Other Services       Precautions / Restrictions Precautions Precautions: Knee;Fall Precaution Comments: instructed no pillow under knee Restrictions Weight Bearing Restrictions: No Other Position/Activity Restrictions: WBAT    Mobility  Bed Mobility               General bed mobility comments: OOB in recliner  Transfers Overall transfer level: Needs assistance Equipment used: Rolling walker (2 wheeled) Transfers: Sit to/from Stand Sit to Stand: Supervision;Min guard         General transfer comment: cues for LE management and use of UEs to self assist  Ambulation/Gait Ambulation/Gait assistance: Supervision;Min guard Gait Distance (Feet): 45 Feet Assistive device: Rolling walker (2 wheeled) Gait Pattern/deviations: Step-to pattern;Decreased step length - right;Decreased step length - left;Shuffle;Trunk flexed Gait velocity: decr   General Gait Details: cues for sequence, posture and position from [employer]             Wheelchair Mobility    Modified Rankin (Stroke Patients Only)       Balance                                            Cognition Arousal/Alertness: Awake/alert Behavior During Therapy: WFL for tasks assessed/performed Overall Cognitive Status: Within Functional Limits for tasks assessed                                         Exercises   Total Knee Replacement TE's following HEP handout 10 reps B LE ankle pumps 05 reps towel squeezes 05 reps knee presses 05 reps heel slides  05 reps SAQ's 05 reps SLR's 05 reps ABD Educated on use of gait belt to assist with TE's Followed by ICE    General Comments        Pertinent Vitals/Pain Pain Score: 5  Pain Location: R knee Pain Descriptors / Indicators: Aching;Sore;Tender;Tightness Pain Intervention(s): Monitored during session;Premedicated before session;Repositioned;Ice applied    Home Living                      Prior Function            PT Goals (current goals can now be found in the care plan section) Progress towards PT goals: Progressing toward goals    Frequency    7X/week      PT Plan Current plan remains appropriate    Co-evaluation              AM-PAC PT "6 Clicks" Mobility   Outcome Measure  Help needed turning from your back to your side while in  a flat bed without using bedrails?: A Little Help needed moving from lying on your back to sitting on the side of a flat bed without using bedrails?: A Little Help needed moving to and from a bed to a chair (including a wheelchair)?: A Little Help needed standing up from a chair using your arms (e.g., wheelchair or bedside chair)?: A Little Help needed to walk in hospital room?: A Little Help needed climbing 3-5 steps with a railing? : A Little 6 Click Score: 18    End of Session Equipment Utilized During Treatment: Gait belt Activity Tolerance: Patient tolerated treatment well Patient left: in chair;with call bell/phone within reach;with nursing/sitter in room Nurse Communication: Mobility status PT Visit Diagnosis: Difficulty in walking, not elsewhere classified (R26.2)     Time: 3329-5188 PT Time Calculation (min) (ACUTE ONLY): 28 min  Charges:  $Gait Training: 8-22 mins $Therapeutic Exercise: 8-22  mins                     Rica Koyanagi  PTA Acute  Rehabilitation Services Pager      [phone] Office      [phone]

## 2019-12-08 ENCOUNTER — Encounter (HOSPITAL_COMMUNITY): Payer: Self-pay | Admitting: Orthopedic Surgery

## 2019-12-09 DIAGNOSIS — M25561 Pain in right knee: Secondary | ICD-10-CM | POA: Diagnosis not present

## 2019-12-09 NOTE — Discharge Summary (Signed)
Physician Discharge Summary   Patient ID: JAID QUIRION MRN: [id number] DOB/AGE: [birth year] 78 y.o.  Admit date: 12/06/2019 Discharge date: 12/07/2019  Primary Diagnosis: Osteoarthritis, right knee   Admission Diagnoses:  Past Medical History:  Diagnosis Date  . Arthritis    osteo  . Asthma   . Asthma   . Basal cell carcinoma    face  . COPD (chronic obstructive pulmonary disease) ([place])   . GERD (gastroesophageal reflux disease)   . Heart palpitations   . History of iron deficiency anemia   . History of migraine    while taking birth control pills  . Hypercholesteremia   . Neuropathy    left greater than right feet  . Obesity   . Obstructive sleep apnea 07/09/2012   CPAP   . Peripheral neuropathy   . Pneumonia    as a child  . PONV (postoperative nausea and vomiting)    not since 1967  . Shortness of breath    with exertion  . Stroke ([place])   . TIA (transient ischemic attack) 2014  . TIA (transient ischemic attack) 2014   no residual - no follow up needed  . 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   Weight as of this encounter: 99.7 kg.  Procedure:  Procedure(s) (LRB): TOTAL KNEE ARTHROPLASTY (Right)   Consults: None  HPI: Christy Stephenson is a 49 y.o. year old female with end stage OA of her right knee with progressively worsening pain and dysfunction. She has constant pain, with activity and at rest and significant functional deficits with difficulties even with ADLs. She has had extensive non-op management including analgesics, injections of cortisone and viscosupplements, and home exercise program, but remains in significant pain with significant dysfunction.Radiographs show bone on bone arthritis medial and  patellofemoral. She presents now for right Total Knee Arthroplasty.    Laboratory Data: Admission on 12/06/2019, Discharged on 12/07/2019  Component Date Value Ref Range Status  . WBC 12/07/2019 14.7* 4.0 - 10.5 K/uL Final  . RBC 12/07/2019 4.12  3.87 - 5.11 MIL/uL Final  . Hemoglobin 12/07/2019 12.8  12.0 - 15.0 g/dL Final  . HCT 12/07/2019 39.7  36 - 46 % Final  . MCV 12/07/2019 96.4  80.0 - 100.0 fL Final  . MCH 12/07/2019 31.1  26.0 - 34.0 pg Final  . MCHC 12/07/2019 32.2  30.0 - 36.0 g/dL Final  . RDW 12/07/2019 12.5  11.5 - 15.5 % Final  . Platelets 12/07/2019 198  150 - 400 K/uL Final  . nRBC 12/07/2019 0.0  0.0 - 0.2 % Final   Performed at [facility], [place] [address], [place], [place] [address]  . Sodium 12/07/2019 138  135 - 145 mmol/L Final  . Potassium 12/07/2019 4.4  3.5 - 5.1 mmol/L Final  . Chloride 12/07/2019 107  98 - 111 mmol/L Final  . CO2 12/07/2019 23  22 - 32 mmol/L Final  . Glucose, Bld 12/07/2019 141* 70 - 99 mg/dL Final   Glucose reference range applies only to samples taken after fasting for at least 8 hours.  . BUN 12/07/2019 18  8 - 23 mg/dL Final  . Creatinine, Ser 12/07/2019 0.79  0.44 - 1.00 mg/dL Final  .  Calcium 12/07/2019 8.6* 8.9 - 10.3 mg/dL Final  . GFR calc non Af Amer 12/07/2019 >60  >60 mL/min Final  . GFR calc Af Amer 12/07/2019 >60  >60 mL/min Final  . Anion gap 12/07/2019 8  5 - 15 Final   Performed at [facility], [place] [address], [place], [place] [address]  Hospital Outpatient Visit on 12/02/2019  Component Date Value Ref Range Status  . SARS Coronavirus 2 12/02/2019 NEGATIVE  NEGATIVE Final   Comment: (NOTE) SARS-CoV-2 target nucleic acids are NOT DETECTED.  The SARS-CoV-2 RNA is generally detectable in upper and lower respiratory specimens during the acute phase of infection. Negative results do not preclude SARS-CoV-2 infection, do not rule out co-infections with other pathogens, and should not  be used as the sole basis for treatment or other patient management decisions. Negative results must be combined with clinical observations, patient history, and epidemiological information. The expected result is Negative.  Fact Sheet for Patients: SugarRoll.be  Fact Sheet for Healthcare Providers: https://www.woods-mathews.com/  This test is not yet approved or cleared by the [place] FDA and  has been authorized for detection and/or diagnosis of SARS-CoV-2 by FDA under an Emergency Use Authorization (EUA). This EUA will remain  in effect (meaning this test can be used) for the duration of the COVID-19 declaration under Se                          ction 564(b)(1) of the Act, 21 U.S.C. section 360bbb-3(b)(1), unless the authorization is terminated or revoked sooner.  Performed at [facility], [place] [address], [place], [place] [address]   Hospital Outpatient Visit on 12/01/2019  Component Date Value Ref Range Status  . MRSA, PCR 12/01/2019 NEGATIVE  NEGATIVE Final  . Staphylococcus aureus 12/01/2019 NEGATIVE  NEGATIVE Final   Comment: (NOTE) The Xpert SA Assay (FDA approved for NASAL specimens in patients 2 years of age and older), is one component of a comprehensive surveillance program. It is not intended to diagnose infection nor to guide or monitor treatment. Performed at [facility], [place] [address] [place], [place] [address]   . aPTT 12/01/2019 27  24 - 36 seconds Final   Performed at [facility], [place] [address], [place], [place] [address]  . Prothrombin Time 12/01/2019 12.7  11.4 - 15.2 seconds Final  . INR 12/01/2019 1.0  0.8 - 1.2 Final   Comment: (NOTE) INR goal varies based on device and disease states. Performed at [facility], [place] [address], [place], [place] [address]   . ABO/RH(D) 12/01/2019 A POS   Final  . Antibody Screen 12/01/2019 NEG   Final   . Sample Expiration 12/01/2019 12/09/2019,2359   Final  . Extend sample reason 12/01/2019    Final                   Value:NO TRANSFUSIONS OR PREGNANCY IN THE PAST 3 MONTHS Performed at [facility], [place] [address], [place], [place] [address]      X-Rays:No results found.  EKG: Orders placed or performed during the hospital encounter of 07/09/12  . ED EKG (<57mins upon arrival to the ED)  . ED EKG (<5mins upon arrival to the ED)  . CARDIAC MONITORING STRIP  . CARDIAC MONITORING STRIP  . CARDIAC MONITORING STRIP  . CARDIAC MONITORING STRIP  . CARDIAC MONITORING STRIP  . CARDIAC MONITORING STRIP  . EKG  Hospital Course: Christy Stephenson is a 67 y.o. who was admitted to [facility]. They were brought to the operating room on 12/06/2019 and underwent Procedure(s): TOTAL KNEE ARTHROPLASTY.  Patient tolerated the procedure well and was later transferred to the recovery room and then to the orthopaedic floor for postoperative care. They were given PO and IV analgesics for pain control following their surgery. They were given 24 hours of postoperative antibiotics of  Anti-infectives (From admission, onward)   Start     Dose/Rate Route Frequency Ordered Stop   12/06/19 1730  ceFAZolin (ANCEF) IVPB 2g/100 mL premix        2 g 200 mL/hr over 30 Minutes Intravenous Every 6 hours 12/06/19 1615 12/06/19 2338   12/06/19 0915  ceFAZolin (ANCEF) IVPB 2g/100 mL premix        2 g 200 mL/hr over 30 Minutes Intravenous On call to O.R. 12/06/19 0910 12/06/19 1200     and started on DVT prophylaxis in the form of Xarelto.   PT and OT were ordered for total joint protocol. Discharge planning consulted to help with postop disposition and equipment needs.  Patient had a good night on the evening of surgery. They started to get up OOB with therapy on POD #0. Pt was seen during rounds and was ready to go home pending progress with therapy. She worked with therapy on POD #1 and was  meeting her goals. Pt was discharged to home later that day in stable condition.  Diet: Regular diet Activity: WBAT Follow-up: in 2 weeks Disposition: Home with outpatient physical therapy Discharged Condition: stable   Discharge Instructions    Call MD / Call 911   Complete by: As directed    If you experience chest pain or shortness of breath, CALL 911 and be transported to the hospital emergency room.  If you develope a fever above 101 F, pus (white drainage) or increased drainage or redness at the wound, or calf pain, call your surgeon's office.   Change dressing   Complete by: As directed    You may remove the bulky bandage (ACE wrap and gauze) two days after surgery. You will have an adhesive waterproof bandage underneath. Leave this in place until your first follow-up appointment.   Constipation Prevention   Complete by: As directed    Drink plenty of fluids.  Prune juice may be helpful.  You may use a stool softener, such as Colace (over the counter) 100 mg twice a day.  Use MiraLax (over the counter) for constipation as needed.   Diet - low sodium heart healthy   Complete by: As directed    Do not put a pillow under the knee. Place it under the heel.   Complete by: As directed    Driving restrictions   Complete by: As directed    No driving for two weeks   TED hose   Complete by: As directed    Use stockings (TED hose) for three weeks on both leg(s).  You may remove them at night for sleeping.   Weight bearing as tolerated   Complete by: As directed      Allergies as of 12/07/2019      Reactions   Sulfa Antibiotics Other (See Comments), Palpitations   Actifed [triprolidine-pse] Palpitations   Lipitor [atorvastatin] Other (See Comments)   Aches and pains   Bupropion Hcl Itching, Rash      Medication List    STOP taking these medications   CALCIUM-VITAMIN  D PO   diclofenac 75 MG EC tablet Commonly known as: VOLTAREN   multivitamin with minerals Tabs tablet     OVER THE COUNTER MEDICATION   vitamin C 1000 MG tablet   Vitamin D3 125 MCG (5000 UT) Caps   vitamin E 180 MG (400 UNITS) capsule   zinc gluconate 50 MG tablet     TAKE these medications   cholestyramine 4 g packet Commonly known as: QUESTRAN Take 4 g by mouth daily.   EQ RESTORE TEARS OP Place 1 drop into both eyes 3 (three) times daily as needed (dry eyes).   estradiol 0.025 MG/24HR Commonly known as: VIVELLE-DOT Place 1 patch onto the skin 2 (two) times a week.   gabapentin 600 MG tablet Commonly known as: NEURONTIN Take 600 mg by mouth in the morning, at noon, in the evening, and at bedtime.   methocarbamol 500 MG tablet Commonly known as: ROBAXIN Take 1 tablet (500 mg total) by mouth every 6 (six) hours as needed for muscle spasms.   NexIUM 24HR 20 MG capsule Generic drug: esomeprazole Take 20 mg by mouth 2 (two) times daily before a meal.   oxyCODONE 5 MG immediate release tablet Commonly known as: Roxicodone Take 1-2 tablets (5-10 mg total) by mouth every 6 (six) hours as needed for severe pain.   progesterone 100 MG capsule Commonly known as: PROMETRIUM Take 100 mg by mouth at bedtime.   rivaroxaban 10 MG Tabs tablet Commonly known as: XARELTO Take 1 tablet (10 mg total) by mouth daily with breakfast for 20 days. Then take one 81 mg aspirin once a day for three weeks. Then discontinue aspirin.   traMADol 50 MG tablet Commonly known as: ULTRAM Take 1-2 tablets (50-100 mg total) by mouth every 6 (six) hours as needed for moderate pain.   Trelegy Ellipta 100-62.5-25 MCG/INH Aepb Generic drug: Fluticasone-Umeclidin-Vilant Inhale 1 puff into the lungs daily.            Discharge Care Instructions  (From admission, onward)         Start     Ordered   12/07/19 0000  Weight bearing as tolerated        12/07/19 0707   12/07/19 0000  Change dressing       Comments: You may remove the bulky bandage (ACE wrap and gauze) two days after surgery. You  will have an adhesive waterproof bandage underneath. Leave this in place until your first follow-up appointment.   12/07/19 0707          Follow-up Information    Gaynelle Arabian, MD. Go on 12/21/2019.   Specialty: Orthopedic Surgery Why: You are scheduled for your first post op appointment on Tuesday August 3rd at 4:30pm. Contact information: [address] [place] [address] [phone]        Rosilyn Mings.. Go on 12/09/2019.   Why: You are scheduled for your PT eval on Thursday July 22nd at 3:15pm. Contact information: [place] [address] [phone]               Signed: Theresa Duty, PA-C Orthopedic Surgery 12/09/2019, 8:52 AM

## 2019-12-17 DIAGNOSIS — M25561 Pain in right knee: Secondary | ICD-10-CM | POA: Diagnosis not present

## 2019-12-21 DIAGNOSIS — M25561 Pain in right knee: Secondary | ICD-10-CM | POA: Diagnosis not present

## 2019-12-23 DIAGNOSIS — M25561 Pain in right knee: Secondary | ICD-10-CM | POA: Diagnosis not present

## 2019-12-28 DIAGNOSIS — M25561 Pain in right knee: Secondary | ICD-10-CM | POA: Diagnosis not present

## 2019-12-30 DIAGNOSIS — M25561 Pain in right knee: Secondary | ICD-10-CM | POA: Diagnosis not present

## 2020-01-03 DIAGNOSIS — M25561 Pain in right knee: Secondary | ICD-10-CM | POA: Diagnosis not present

## 2020-01-06 DIAGNOSIS — M25561 Pain in right knee: Secondary | ICD-10-CM | POA: Diagnosis not present

## 2020-01-11 DIAGNOSIS — M25561 Pain in right knee: Secondary | ICD-10-CM | POA: Diagnosis not present

## 2020-01-11 DIAGNOSIS — Z471 Aftercare following joint replacement surgery: Secondary | ICD-10-CM | POA: Diagnosis not present

## 2020-01-11 DIAGNOSIS — Z96651 Presence of right artificial knee joint: Secondary | ICD-10-CM | POA: Diagnosis not present

## 2020-01-14 DIAGNOSIS — M25561 Pain in right knee: Secondary | ICD-10-CM | POA: Diagnosis not present

## 2020-01-17 DIAGNOSIS — M25561 Pain in right knee: Secondary | ICD-10-CM | POA: Diagnosis not present

## 2020-01-20 DIAGNOSIS — M25561 Pain in right knee: Secondary | ICD-10-CM | POA: Diagnosis not present

## 2020-01-25 DIAGNOSIS — M25561 Pain in right knee: Secondary | ICD-10-CM | POA: Diagnosis not present

## 2020-01-31 DIAGNOSIS — M25561 Pain in right knee: Secondary | ICD-10-CM | POA: Diagnosis not present

## 2020-02-01 DIAGNOSIS — G4733 Obstructive sleep apnea (adult) (pediatric): Secondary | ICD-10-CM | POA: Diagnosis not present

## 2020-02-02 DIAGNOSIS — M25562 Pain in left knee: Secondary | ICD-10-CM | POA: Diagnosis not present

## 2020-02-03 DIAGNOSIS — Z1152 Encounter for screening for COVID-19: Secondary | ICD-10-CM | POA: Diagnosis not present

## 2020-02-04 DIAGNOSIS — M25561 Pain in right knee: Secondary | ICD-10-CM | POA: Diagnosis not present

## 2020-02-07 ENCOUNTER — Other Ambulatory Visit: Payer: Self-pay | Admitting: Physician Assistant

## 2020-02-07 DIAGNOSIS — K9089 Other intestinal malabsorption: Secondary | ICD-10-CM | POA: Diagnosis not present

## 2020-02-07 DIAGNOSIS — K219 Gastro-esophageal reflux disease without esophagitis: Secondary | ICD-10-CM | POA: Diagnosis not present

## 2020-02-07 DIAGNOSIS — R1310 Dysphagia, unspecified: Secondary | ICD-10-CM | POA: Diagnosis not present

## 2020-02-08 DIAGNOSIS — M25561 Pain in right knee: Secondary | ICD-10-CM | POA: Diagnosis not present

## 2020-02-09 ENCOUNTER — Ambulatory Visit
Admission: RE | Admit: 2020-02-09 | Discharge: 2020-02-09 | Disposition: A | Discharge status: Home / Self Care | Payer: Medicare Other | Source: Ambulatory Visit | Attending: Physician Assistant | Admitting: Physician Assistant

## 2020-02-09 DIAGNOSIS — R1310 Dysphagia, unspecified: Secondary | ICD-10-CM

## 2020-02-09 DIAGNOSIS — K224 Dyskinesia of esophagus: Secondary | ICD-10-CM | POA: Diagnosis not present

## 2020-02-09 DIAGNOSIS — K219 Gastro-esophageal reflux disease without esophagitis: Secondary | ICD-10-CM | POA: Diagnosis not present

## 2020-02-11 DIAGNOSIS — M25561 Pain in right knee: Secondary | ICD-10-CM | POA: Diagnosis not present

## 2020-02-15 DIAGNOSIS — M25561 Pain in right knee: Secondary | ICD-10-CM | POA: Diagnosis not present

## 2020-02-16 DIAGNOSIS — E785 Hyperlipidemia, unspecified: Secondary | ICD-10-CM | POA: Diagnosis not present

## 2020-02-16 DIAGNOSIS — M1990 Unspecified osteoarthritis, unspecified site: Secondary | ICD-10-CM | POA: Diagnosis not present

## 2020-02-16 DIAGNOSIS — J45909 Unspecified asthma, uncomplicated: Secondary | ICD-10-CM | POA: Diagnosis not present

## 2020-02-16 DIAGNOSIS — E7800 Pure hypercholesterolemia, unspecified: Secondary | ICD-10-CM | POA: Diagnosis not present

## 2020-02-16 DIAGNOSIS — M1711 Unilateral primary osteoarthritis, right knee: Secondary | ICD-10-CM | POA: Diagnosis not present

## 2020-02-24 ENCOUNTER — Ambulatory Visit: Payer: Medicare Other | Admitting: Internal Medicine

## 2020-03-09 DIAGNOSIS — Z1389 Encounter for screening for other disorder: Secondary | ICD-10-CM | POA: Diagnosis not present

## 2020-03-09 DIAGNOSIS — G629 Polyneuropathy, unspecified: Secondary | ICD-10-CM | POA: Diagnosis not present

## 2020-03-09 DIAGNOSIS — E7800 Pure hypercholesterolemia, unspecified: Secondary | ICD-10-CM | POA: Diagnosis not present

## 2020-03-09 DIAGNOSIS — Z0000 Encounter for general adult medical examination without abnormal findings: Secondary | ICD-10-CM | POA: Diagnosis not present

## 2020-03-09 DIAGNOSIS — Z23 Encounter for immunization: Secondary | ICD-10-CM | POA: Diagnosis not present

## 2020-03-27 DIAGNOSIS — Z23 Encounter for immunization: Secondary | ICD-10-CM | POA: Diagnosis not present

## 2020-04-18 DIAGNOSIS — Z1159 Encounter for screening for other viral diseases: Secondary | ICD-10-CM | POA: Diagnosis not present

## 2020-04-21 DIAGNOSIS — R140 Abdominal distension (gaseous): Secondary | ICD-10-CM | POA: Diagnosis not present

## 2020-04-21 DIAGNOSIS — K219 Gastro-esophageal reflux disease without esophagitis: Secondary | ICD-10-CM | POA: Diagnosis not present

## 2020-04-21 DIAGNOSIS — R197 Diarrhea, unspecified: Secondary | ICD-10-CM | POA: Diagnosis not present

## 2020-04-21 DIAGNOSIS — K3189 Other diseases of stomach and duodenum: Secondary | ICD-10-CM | POA: Diagnosis not present

## 2020-04-21 DIAGNOSIS — Q399 Congenital malformation of esophagus, unspecified: Secondary | ICD-10-CM | POA: Diagnosis not present

## 2020-04-21 DIAGNOSIS — K319 Disease of stomach and duodenum, unspecified: Secondary | ICD-10-CM | POA: Diagnosis not present

## 2020-04-21 DIAGNOSIS — R933 Abnormal findings on diagnostic imaging of other parts of digestive tract: Secondary | ICD-10-CM | POA: Diagnosis not present

## 2020-04-21 DIAGNOSIS — R1310 Dysphagia, unspecified: Secondary | ICD-10-CM | POA: Diagnosis not present

## 2020-04-26 DIAGNOSIS — K319 Disease of stomach and duodenum, unspecified: Secondary | ICD-10-CM | POA: Diagnosis not present

## 2020-04-26 DIAGNOSIS — K219 Gastro-esophageal reflux disease without esophagitis: Secondary | ICD-10-CM | POA: Diagnosis not present

## 2020-04-30 NOTE — Progress Notes (Signed)
HPI F never smoker (+ second hand), retired Therapist, sports, with hx Asthma, Emphysema, Bronchiectasis (on CT), complicated by .Granulomatous Lung Disease, OSA (Dr Maxwell Caul), Polyneuropathy, Transient Global Amnesia, Hyperlipidemia, PFT [facility] 07/23/12- mild obstructive airways disease with response to bronchodilator. Normal lung volumes, normal diffusion. FVC 2.36/79%, FEV1 1.89/83%, FEV1/FVC 0.80, FEF 25-75% 1.31/69%. TLC 96%, DLCO 95%. CT angiogram chest chest [facility] 08/04/2012: Prior granulomatous disease. 5 mm none, supplied nodule right mid lung. Bilateral lower lobe bronchiectatic change and underlying emphysema. Hiatal hernia. Fatty liver. Quant AFB assay 10/15/12- NEG ------------------------------------------------------------------------------------------  10/25/19- 78 yoF never smoker (+ second hand) with hx Asthma, Emphysema (on CT) coming to re-establish as new patient after last seen here in 2014. Medical problem list includes Granulomatous Lung Disease, OSA (Dr Maxwell Caul), Polyneuropathy, Transient Global Amnesia, Hyperlipidemia,  , Flonase, Pending TKR (Dr Wynelle Link) -----OSA, Granulomatous, more wheezing and SOB with exertion Had 2 Phizer Covax. Over the past year noting increased SOB and wheeze with exertion/ sustained walking. OTC Primatene helps. Denies cough. Wheeze noted daily when she lies down. Occ scant yellow sputum. No seasonal or exposure pattern noted.  Uses chair lift at home, pending L TKR- does not climb stairs.  Never DVT. Denies sinus problems. Aware of LPR easily with eating/ drinking, but not active reflux.   05/01/20- 78 yoF never smoker (+ second hand) with hx Asthma, Emphysema (on CT), complicated by  Granulomatous Lung Disease, OSA (Dr Maxwell Caul), Polyneuropathy, Transient Global Amnesia, Hyperlipidemia,  - Flonase, Trelegy 100,  Xarelto Had R TKR in July Covid vax- 3 Phizer Flu vax- had Trelegy helps. Does note some DOE at times- esp hills and stairs- without cough,  wheeze, chest pain. Has been more sedentary wth knee surgery, back pain., Left leg pain. No acute events. Discussed trying rescue inhaler. CXR 10/26/19-  IMPRESSION: No active cardiopulmonary disease. Barium Swallow 02/09/20-  IMPRESSION: 1. Nonspecific esophageal dysmotility. 2. Small sliding-type hiatal hernia with inducible GE reflux. 3. Muscular A ring is noted. The 13 mm barium pill did hang up here for a few minutes before eventually passing into the stomach.  ROS-see HPI   + = positive Constitutional:    weight loss, night sweats, fevers, chills, fatigue, lassitude. HEENT:    headaches, +difficulty swallowing, tooth/dental problems, sore throat,       sneezing, itching, ear ache, nasal congestion, post nasal drip, snoring CV:    chest pain, orthopnea, PND, swelling in lower extremities, anasarca,                                   dizziness, palpitations Resp:   +shortness of breath with exertion or at rest.                productive cough,   non-productive cough, coughing up of blood.              change in color of mucus.  +wheezing.   Skin:    rash or lesions. GI:  No-   heartburn, indigestion, abdominal pain, nausea, vomiting, diarrhea,                 change in bowel habits, loss of appetite GU: dysuria, change in color of urine, no urgency or frequency.   flank pain. MS:  + joint pain, stiffness, decreased range of motion, back pain. Neuro-     nothing unusual Psych:  change in mood or affect.  depression or anxiety.   memory  loss.  OBJ- Physical Exam General- Alert, Oriented, Affect-appropriate, Distress- none acute Skin- rash-none, lesions- none, excoriation- none Lymphadenopathy- none Head- atraumatic            Eyes- Gross vision intact, PERRLA, conjunctivae and secretions clear            Ears- Hearing, canals-normal            Nose- Clear, no-Septal dev, mucus, polyps, erosion, perforation             Throat- Mallampati II , mucosa clear , drainage- none, tonsils-  atrophic Neck- flexible , trachea midline, no stridor , thyroid nl, carotid no bruit Chest - symmetrical excursion , unlabored           Heart/CV- RRR , no murmur , no gallop  , no rub, nl s1 s2                           - JVD- none , edema- none, stasis changes- none, varices- none           Lung- clear to P&A, wheeze- none, cough- none , dullness-none, rub- none           Chest wall-  Abd-  Br/ Gen/ Rectal- Not done, not indicated Extrem- cyanosis- none, clubbing, none, atrophy- none, strength- nl,  + superficial varices Neuro- grossly intact to observation       ROS-see HPI Constitutional:   No-   weight loss, night sweats, fevers, chills, fatigue, lassitude. HEENT:   No-  headaches, difficulty swallowing, tooth/dental problems, sore throat,       No-  sneezing, itching, ear ache, nasal congestion, post nasal drip,  CV:  No-   chest pain, orthopnea, PND, swelling in lower extremities, anasarca, dizziness, palpitations Resp: +  shortness of breath with exertion or at rest.              No-   productive cough,  No non-productive cough,  No- coughing up of blood.              No-   change in color of mucus.  No- wheezing.   Skin: No-   rash or lesions. GI:  No-   heartburn, indigestion, abdominal pain, nausea, vomiting, GU: . MS:  No-   joint pain or swelling.   Neuro-     nothing unusual Psych:  No- change in mood or affect. No depression or anxiety.  No memory loss.  OBJ- Physical Exam General- Alert, Oriented, Affect-appropriate, Distress- none acute. overweight Skin- rash-none, lesions- none, excoriation- none Lymphadenopathy- none Head- atraumatic            Eyes- Gross vision intact, PERRLA, conjunctivae and secretions clear            Ears- Hearing, canals-normal            Nose- Clear, no-Septal dev, mucus, polyps, erosion, perforation             Throat- Mallampati III , mucosa clear , drainage- none, tonsils- atrophic Neck- flexible , trachea midline, no stridor ,  thyroid nl, carotid no bruit Chest - symmetrical excursion , unlabored           Heart/CV- RRR , no murmur , no gallop  , no rub, nl s1 s2                           - JVD-  none , edema- none, stasis changes- none, varices- none           Lung-  clear, wheeze- none, cough- none , dullness-none, rub- none           Chest wall-  Abd-  Br/ Gen/ Rectal- Not done, not indicated Extrem- cyanosis- none, clubbing, none, atrophy- none, strength- nl Neuro- grossly intact to observation

## 2020-05-01 ENCOUNTER — Encounter: Payer: Self-pay | Admitting: Internal Medicine

## 2020-05-01 ENCOUNTER — Other Ambulatory Visit: Payer: Self-pay

## 2020-05-01 ENCOUNTER — Ambulatory Visit (INDEPENDENT_AMBULATORY_CARE_PROVIDER_SITE_OTHER): Payer: Medicare Other | Admitting: Internal Medicine

## 2020-05-01 VITALS — BP 130/72 | HR 77 | Temp 98.0°F | Ht 64.0 in | Wt 203.0 lb

## 2020-05-01 DIAGNOSIS — R0600 Dyspnea, unspecified: Secondary | ICD-10-CM

## 2020-05-01 DIAGNOSIS — J45909 Unspecified asthma, uncomplicated: Secondary | ICD-10-CM

## 2020-05-01 DIAGNOSIS — J8410 Pulmonary fibrosis, unspecified: Secondary | ICD-10-CM

## 2020-05-01 DIAGNOSIS — R0609 Other forms of dyspnea: Secondary | ICD-10-CM

## 2020-05-01 LAB — CBC WITH DIFFERENTIAL/PLATELET
Basophils Absolute: 0 10*3/uL (ref 0.0–0.1)
Basophils Relative: 0.4 % (ref 0.0–3.0)
Eosinophils Absolute: 0.2 10*3/uL (ref 0.0–0.7)
Eosinophils Relative: 2.2 % (ref 0.0–5.0)
HCT: 39 % (ref 36.0–46.0)
Hemoglobin: 13.4 g/dL (ref 12.0–15.0)
Lymphocytes Relative: 17.2 % (ref 12.0–46.0)
Lymphs Abs: 1.5 10*3/uL (ref 0.7–4.0)
MCHC: 34.2 g/dL (ref 30.0–36.0)
MCV: 91.9 fl (ref 78.0–100.0)
Monocytes Absolute: 0.7 10*3/uL (ref 0.1–1.0)
Monocytes Relative: 7.6 % (ref 3.0–12.0)
Neutro Abs: 6.5 10*3/uL (ref 1.4–7.7)
Neutrophils Relative %: 72.6 % (ref 43.0–77.0)
Platelets: 242 10*3/uL (ref 150.0–400.0)
RBC: 4.25 Mil/uL (ref 3.87–5.11)
RDW: 13.7 % (ref 11.5–15.5)
WBC: 9 10*3/uL (ref 4.0–10.5)

## 2020-05-01 MED ORDER — ALBUTEROL SULFATE HFA 108 (90 BASE) MCG/ACT IN AERS: 2.0000 | INHALATION_SPRAY | Freq: Four times a day (QID) | RESPIRATORY_TRACT | 12 refills | Status: AC | PRN

## 2020-05-01 NOTE — Patient Instructions (Signed)
Order- lab- CBC w diff    Dx dyspnea on exertion  Script sent for albuterol rescue inhaler to use if needed for shortness of breath  Please call if we can help

## 2020-05-01 NOTE — Assessment & Plan Note (Addendum)
Moderate persistent. Controlled with Trelegy. Residual dyspnea on exertion may be due to deconditioning- her activity is limited. Plan- add rescue inhaler. Check cbc to r/o anemia.

## 2020-05-16 DIAGNOSIS — M1711 Unilateral primary osteoarthritis, right knee: Secondary | ICD-10-CM | POA: Diagnosis not present

## 2020-05-16 DIAGNOSIS — E785 Hyperlipidemia, unspecified: Secondary | ICD-10-CM | POA: Diagnosis not present

## 2020-05-16 DIAGNOSIS — E7800 Pure hypercholesterolemia, unspecified: Secondary | ICD-10-CM | POA: Diagnosis not present

## 2020-05-16 DIAGNOSIS — J45909 Unspecified asthma, uncomplicated: Secondary | ICD-10-CM | POA: Diagnosis not present

## 2020-05-16 DIAGNOSIS — K219 Gastro-esophageal reflux disease without esophagitis: Secondary | ICD-10-CM | POA: Diagnosis not present

## 2020-05-16 DIAGNOSIS — M1990 Unspecified osteoarthritis, unspecified site: Secondary | ICD-10-CM | POA: Diagnosis not present

## 2020-05-21 ENCOUNTER — Other Ambulatory Visit: Payer: Self-pay | Admitting: Internal Medicine

## 2020-06-04 NOTE — Assessment & Plan Note (Addendum)
Not seeing an obvious pulmonary process at this visit. Discussed cardiac, pulmonary, anemia potential contributors. She is followed by cardiology. Plna- rescue inhaler for trial, lba for CBC w diff

## 2020-06-04 NOTE — Assessment & Plan Note (Signed)
CXR stable as of 10/26/19 with no active issue.

## 2020-06-21 ENCOUNTER — Other Ambulatory Visit: Payer: Self-pay | Admitting: Internal Medicine

## 2020-07-06 ENCOUNTER — Ambulatory Visit: Payer: Medicare Other | Admitting: Internal Medicine

## 2020-08-03 DIAGNOSIS — J45909 Unspecified asthma, uncomplicated: Secondary | ICD-10-CM | POA: Diagnosis not present

## 2020-08-03 DIAGNOSIS — E785 Hyperlipidemia, unspecified: Secondary | ICD-10-CM | POA: Diagnosis not present

## 2020-08-03 DIAGNOSIS — E7800 Pure hypercholesterolemia, unspecified: Secondary | ICD-10-CM | POA: Diagnosis not present

## 2020-08-03 DIAGNOSIS — M1990 Unspecified osteoarthritis, unspecified site: Secondary | ICD-10-CM | POA: Diagnosis not present

## 2020-08-03 DIAGNOSIS — K219 Gastro-esophageal reflux disease without esophagitis: Secondary | ICD-10-CM | POA: Diagnosis not present

## 2020-08-03 DIAGNOSIS — M1711 Unilateral primary osteoarthritis, right knee: Secondary | ICD-10-CM | POA: Diagnosis not present

## 2020-09-21 DIAGNOSIS — M1711 Unilateral primary osteoarthritis, right knee: Secondary | ICD-10-CM | POA: Diagnosis not present

## 2020-09-21 DIAGNOSIS — E7800 Pure hypercholesterolemia, unspecified: Secondary | ICD-10-CM | POA: Diagnosis not present

## 2020-09-21 DIAGNOSIS — E785 Hyperlipidemia, unspecified: Secondary | ICD-10-CM | POA: Diagnosis not present

## 2020-09-21 DIAGNOSIS — J45909 Unspecified asthma, uncomplicated: Secondary | ICD-10-CM | POA: Diagnosis not present

## 2020-09-21 DIAGNOSIS — M1990 Unspecified osteoarthritis, unspecified site: Secondary | ICD-10-CM | POA: Diagnosis not present

## 2020-09-21 DIAGNOSIS — K219 Gastro-esophageal reflux disease without esophagitis: Secondary | ICD-10-CM | POA: Diagnosis not present

## 2020-09-26 DIAGNOSIS — Z6835 Body mass index (BMI) 35.0-35.9, adult: Secondary | ICD-10-CM | POA: Diagnosis not present

## 2020-09-26 DIAGNOSIS — R32 Unspecified urinary incontinence: Secondary | ICD-10-CM | POA: Diagnosis not present

## 2020-09-26 DIAGNOSIS — R895 Abnormal microbiological findings in specimens from other organs, systems and tissues: Secondary | ICD-10-CM | POA: Diagnosis not present

## 2020-09-26 DIAGNOSIS — Z1231 Encounter for screening mammogram for malignant neoplasm of breast: Secondary | ICD-10-CM | POA: Diagnosis not present

## 2020-09-26 DIAGNOSIS — F410 Panic disorder [episodic paroxysmal anxiety] without agoraphobia: Secondary | ICD-10-CM | POA: Diagnosis not present

## 2020-09-26 DIAGNOSIS — Z124 Encounter for screening for malignant neoplasm of cervix: Secondary | ICD-10-CM | POA: Diagnosis not present

## 2020-11-30 DIAGNOSIS — M1711 Unilateral primary osteoarthritis, right knee: Secondary | ICD-10-CM | POA: Diagnosis not present

## 2020-11-30 DIAGNOSIS — M1990 Unspecified osteoarthritis, unspecified site: Secondary | ICD-10-CM | POA: Diagnosis not present

## 2020-11-30 DIAGNOSIS — E7800 Pure hypercholesterolemia, unspecified: Secondary | ICD-10-CM | POA: Diagnosis not present

## 2020-11-30 DIAGNOSIS — K219 Gastro-esophageal reflux disease without esophagitis: Secondary | ICD-10-CM | POA: Diagnosis not present

## 2020-11-30 DIAGNOSIS — J45909 Unspecified asthma, uncomplicated: Secondary | ICD-10-CM | POA: Diagnosis not present

## 2020-11-30 DIAGNOSIS — E785 Hyperlipidemia, unspecified: Secondary | ICD-10-CM | POA: Diagnosis not present

## 2021-01-10 DIAGNOSIS — E785 Hyperlipidemia, unspecified: Secondary | ICD-10-CM | POA: Diagnosis not present

## 2021-01-10 DIAGNOSIS — M1711 Unilateral primary osteoarthritis, right knee: Secondary | ICD-10-CM | POA: Diagnosis not present

## 2021-01-10 DIAGNOSIS — K219 Gastro-esophageal reflux disease without esophagitis: Secondary | ICD-10-CM | POA: Diagnosis not present

## 2021-01-10 DIAGNOSIS — J45909 Unspecified asthma, uncomplicated: Secondary | ICD-10-CM | POA: Diagnosis not present

## 2021-01-10 DIAGNOSIS — E7800 Pure hypercholesterolemia, unspecified: Secondary | ICD-10-CM | POA: Diagnosis not present

## 2021-01-10 DIAGNOSIS — M1990 Unspecified osteoarthritis, unspecified site: Secondary | ICD-10-CM | POA: Diagnosis not present

## 2021-02-09 ENCOUNTER — Other Ambulatory Visit: Payer: Self-pay | Admitting: Internal Medicine

## 2021-03-12 DIAGNOSIS — G4733 Obstructive sleep apnea (adult) (pediatric): Secondary | ICD-10-CM | POA: Diagnosis not present

## 2021-03-12 DIAGNOSIS — E7800 Pure hypercholesterolemia, unspecified: Secondary | ICD-10-CM | POA: Diagnosis not present

## 2021-03-12 DIAGNOSIS — Z23 Encounter for immunization: Secondary | ICD-10-CM | POA: Diagnosis not present

## 2021-03-12 DIAGNOSIS — G629 Polyneuropathy, unspecified: Secondary | ICD-10-CM | POA: Diagnosis not present

## 2021-03-12 DIAGNOSIS — Z0000 Encounter for general adult medical examination without abnormal findings: Secondary | ICD-10-CM | POA: Diagnosis not present

## 2021-03-12 DIAGNOSIS — Z1389 Encounter for screening for other disorder: Secondary | ICD-10-CM | POA: Diagnosis not present

## 2021-03-12 DIAGNOSIS — M8580 Other specified disorders of bone density and structure, unspecified site: Secondary | ICD-10-CM | POA: Diagnosis not present

## 2021-03-21 ENCOUNTER — Other Ambulatory Visit: Payer: Self-pay | Admitting: Internal Medicine

## 2021-03-21 DIAGNOSIS — M8580 Other specified disorders of bone density and structure, unspecified site: Secondary | ICD-10-CM

## 2021-03-21 DIAGNOSIS — R030 Elevated blood-pressure reading, without diagnosis of hypertension: Secondary | ICD-10-CM | POA: Diagnosis not present

## 2021-03-21 DIAGNOSIS — Z6837 Body mass index (BMI) 37.0-37.9, adult: Secondary | ICD-10-CM | POA: Diagnosis not present

## 2021-03-21 DIAGNOSIS — S32009K Unspecified fracture of unspecified lumbar vertebra, subsequent encounter for fracture with nonunion: Secondary | ICD-10-CM | POA: Diagnosis not present

## 2021-03-22 ENCOUNTER — Other Ambulatory Visit: Payer: Self-pay | Admitting: Neurological Surgery

## 2021-03-22 DIAGNOSIS — S32009K Unspecified fracture of unspecified lumbar vertebra, subsequent encounter for fracture with nonunion: Secondary | ICD-10-CM

## 2021-04-10 ENCOUNTER — Ambulatory Visit
Admission: RE | Admit: 2021-04-10 | Discharge: 2021-04-10 | Disposition: A | Discharge status: Home / Self Care | Payer: Medicare Other | Source: Ambulatory Visit | Attending: Neurological Surgery | Admitting: Neurological Surgery

## 2021-04-10 ENCOUNTER — Other Ambulatory Visit: Payer: Self-pay

## 2021-04-10 ENCOUNTER — Other Ambulatory Visit: Payer: Medicare Other

## 2021-04-10 DIAGNOSIS — M4807 Spinal stenosis, lumbosacral region: Secondary | ICD-10-CM | POA: Diagnosis not present

## 2021-04-10 DIAGNOSIS — M1711 Unilateral primary osteoarthritis, right knee: Secondary | ICD-10-CM | POA: Diagnosis not present

## 2021-04-10 DIAGNOSIS — G4733 Obstructive sleep apnea (adult) (pediatric): Secondary | ICD-10-CM | POA: Diagnosis not present

## 2021-04-10 DIAGNOSIS — M545 Low back pain, unspecified: Secondary | ICD-10-CM | POA: Diagnosis not present

## 2021-04-10 DIAGNOSIS — J45909 Unspecified asthma, uncomplicated: Secondary | ICD-10-CM | POA: Diagnosis not present

## 2021-04-10 DIAGNOSIS — K219 Gastro-esophageal reflux disease without esophagitis: Secondary | ICD-10-CM | POA: Diagnosis not present

## 2021-04-10 DIAGNOSIS — M4326 Fusion of spine, lumbar region: Secondary | ICD-10-CM | POA: Diagnosis not present

## 2021-04-10 DIAGNOSIS — E7800 Pure hypercholesterolemia, unspecified: Secondary | ICD-10-CM | POA: Diagnosis not present

## 2021-04-10 DIAGNOSIS — E785 Hyperlipidemia, unspecified: Secondary | ICD-10-CM | POA: Diagnosis not present

## 2021-04-10 DIAGNOSIS — M1990 Unspecified osteoarthritis, unspecified site: Secondary | ICD-10-CM | POA: Diagnosis not present

## 2021-04-10 DIAGNOSIS — S32009K Unspecified fracture of unspecified lumbar vertebra, subsequent encounter for fracture with nonunion: Secondary | ICD-10-CM

## 2021-05-04 NOTE — Progress Notes (Signed)
HPI F never smoker (+ second hand), retired Therapist, sports, with hx Asthma, Emphysema, Bronchiectasis (on CT), complicated by .Granulomatous Lung Disease, OSA (Dr Maxwell Caul), Polyneuropathy, Transient Global Amnesia, Hyperlipidemia, PFT [facility] 07/23/12- mild obstructive airways disease with response to bronchodilator. Normal lung volumes, normal diffusion. FVC 2.36/79%, FEV1 1.89/83%, FEV1/FVC 0.80, FEF 25-75% 1.31/69%. TLC 96%, DLCO 95%. CT angiogram chest chest [facility] 08/04/2012: Prior granulomatous disease. 5 mm none, supplied nodule right mid lung. Bilateral lower lobe bronchiectatic change and underlying emphysema. Hiatal hernia. Fatty liver. Quant AFB assay 10/15/12- NEG ------------------------------------------------------------------------------------------   05/01/20- 78 yoF never smoker (+ second hand) with hx Asthma, Emphysema (on CT), complicated by  Granulomatous Lung Disease, OSA (Dr Maxwell Caul), Polyneuropathy, Transient Global Amnesia, Hyperlipidemia,  - Flonase, Trelegy 100,  Xarelto Had R TKR in July Covid vax- 3 Phizer Flu vax- had Trelegy helps. Does note some DOE at times- esp hills and stairs- without cough, wheeze, chest pain. Has been more sedentary wth knee surgery, back pain., Left leg pain. No acute events. Discussed trying rescue inhaler. CXR 10/26/19-  IMPRESSION: No active cardiopulmonary disease. Barium Swallow 02/09/20-  IMPRESSION: 1. Nonspecific esophageal dysmotility. 2. Small sliding-type hiatal hernia with inducible GE reflux. 3. Muscular A ring is noted. The 13 mm barium pill did hang up here for a few minutes before eventually passing into the stomach.  05/07/21- 80 yoF never smoker (+ second hand) with hx Asthma, Emphysema (on CT), complicated by  Granulomatous Lung Disease, OSA (Dr Maxwell Caul), Polyneuropathy, Transient Global Amnesia/ CVA,, Hyperlipidemia, GERD,  Covid infection, Oct 2022,   - Flonase, Trelegy 100,  Xarelto, Ventolin hfa,  Covid vax- 3 Phizer Flu  vax-had Reports COVID infection 6 to 8 weeks ago.  Has had worse wheezing since then with some dry cough and fatigue.  She got no antiviral therapy and was managed at home.  More frequent use of rescue inhaler does help.  ROS-see HPI   + = positive Constitutional:    weight loss, night sweats, fevers, chills, +fatigue, lassitude. HEENT:    headaches, +difficulty swallowing, tooth/dental problems, sore throat,       sneezing, itching, ear ache, nasal congestion, post nasal drip, snoring CV:    chest pain, orthopnea, PND, swelling in lower extremities, anasarca,                                   dizziness, palpitations Resp:   +shortness of breath with exertion or at rest.                productive cough,   non-productive cough, coughing up of blood.              change in color of mucus.  +wheezing.   Skin:    rash or lesions. GI:  No-   heartburn, indigestion, abdominal pain, nausea, vomiting, diarrhea,                 change in bowel habits, loss of appetite GU: dysuria, change in color of urine, no urgency or frequency.   flank pain. MS:  + joint pain, stiffness, decreased range of motion, back pain. Neuro-     nothing unusual Psych:  change in mood or affect.  depression or anxiety.   memory loss.  OBJ- Physical Exam General- Alert, Oriented, Affect-appropriate, Distress- none acute, + obese Skin- rash-none, lesions- none, excoriation- none Lymphadenopathy- none Head- atraumatic  Eyes- Gross vision intact, PERRLA, conjunctivae and secretions clear            Ears- Hearing, canals-normal            Nose- Clear, no-Septal dev, mucus, polyps, erosion, perforation             Throat- Mallampati II , mucosa clear , drainage- none, tonsils- atrophic Neck- flexible , trachea midline, no stridor , thyroid nl, carotid no bruit Chest - symmetrical excursion , unlabored           Heart/CV- RRR , no murmur , no gallop  , no rub, nl s1 s2                           - JVD- none , edema-  none, stasis changes- none, varices- none           Lung- clear to P&A, wheeze- none, cough- none , dullness-none, rub- none           Chest wall-  Abd-  Br/ Gen/ Rectal- Not done, not indicated Extrem- cyanosis- none, clubbing, none, atrophy- none, strength- nl,  + superficial varices Neuro- grossly intact to observation       ROS-see HPI Constitutional:   No-   weight loss, night sweats, fevers, chills, fatigue, lassitude. HEENT:   No-  headaches, difficulty swallowing, tooth/dental problems, sore throat,       No-  sneezing, itching, ear ache, nasal congestion, post nasal drip,  CV:  No-   chest pain, orthopnea, PND, swelling in lower extremities, anasarca, dizziness, palpitations Resp: +  shortness of breath with exertion or at rest.              No-   productive cough,  No non-productive cough,  No- coughing up of blood.              No-   change in color of mucus.  No- wheezing.   Skin: No-   rash or lesions. GI:  No-   heartburn, indigestion, abdominal pain, nausea, vomiting, GU: . MS:  No-   joint pain or swelling.   Neuro-     nothing unusual Psych:  No- change in mood or affect. No depression or anxiety.  No memory loss.  OBJ- Physical Exam General- Alert, Oriented, Affect-appropriate, Distress- none acute. overweight Skin- rash-none, lesions- none, excoriation- none Lymphadenopathy- none Head- atraumatic            Eyes- Gross vision intact, PERRLA, conjunctivae and secretions clear            Ears- Hearing, canals-normal            Nose- Clear, no-Septal dev, mucus, polyps, erosion, perforation             Throat- Mallampati III , mucosa clear , drainage- none, tonsils- atrophic Neck- flexible , trachea midline, no stridor , thyroid nl, carotid no bruit Chest - symmetrical excursion , unlabored           Heart/CV- RRR , no murmur , no gallop  , no rub, nl s1 s2                           - JVD- none , edema- none, stasis changes- none, varices- none            Lung-  clear, wheeze- none, cough- none , dullness-none, rub- none  Chest wall-  Abd-  Br/ Gen/ Rectal- Not done, not indicated Extrem- cyanosis- none, clubbing, none, atrophy- none, strength- nl Neuro- grossly intact to observation

## 2021-05-07 ENCOUNTER — Ambulatory Visit (INDEPENDENT_AMBULATORY_CARE_PROVIDER_SITE_OTHER): Payer: Medicare Other | Admitting: Internal Medicine

## 2021-05-07 ENCOUNTER — Ambulatory Visit (INDEPENDENT_AMBULATORY_CARE_PROVIDER_SITE_OTHER): Payer: Medicare Other

## 2021-05-07 ENCOUNTER — Encounter: Payer: Self-pay | Admitting: Internal Medicine

## 2021-05-07 ENCOUNTER — Other Ambulatory Visit: Payer: Self-pay

## 2021-05-07 VITALS — BP 132/82 | HR 71 | Temp 97.7°F | Ht 64.0 in | Wt 225.6 lb

## 2021-05-07 DIAGNOSIS — J45909 Unspecified asthma, uncomplicated: Secondary | ICD-10-CM

## 2021-05-07 DIAGNOSIS — G4733 Obstructive sleep apnea (adult) (pediatric): Secondary | ICD-10-CM

## 2021-05-07 DIAGNOSIS — J45901 Unspecified asthma with (acute) exacerbation: Secondary | ICD-10-CM | POA: Diagnosis not present

## 2021-05-07 DIAGNOSIS — I501 Left ventricular failure: Secondary | ICD-10-CM | POA: Diagnosis not present

## 2021-05-07 LAB — COMPREHENSIVE METABOLIC PANEL
ALT: 16 U/L (ref 0–35)
AST: 16 U/L (ref 0–37)
Albumin: 4.1 g/dL (ref 3.5–5.2)
Alkaline Phosphatase: 118 U/L — ABNORMAL HIGH (ref 39–117)
BUN: 20 mg/dL (ref 6–23)
CO2: 28 mEq/L (ref 19–32)
Calcium: 9.3 mg/dL (ref 8.4–10.5)
Chloride: 105 mEq/L (ref 96–112)
Creatinine, Ser: 1.13 mg/dL (ref 0.40–1.20)
GFR: 46.37 mL/min — ABNORMAL LOW (ref >60.00)
Glucose, Bld: 85 mg/dL (ref 70–99)
Potassium: 4 mEq/L (ref 3.5–5.1)
Sodium: 141 mEq/L (ref 135–145)
Total Bilirubin: 0.5 mg/dL (ref 0.2–1.2)
Total Protein: 6.7 g/dL (ref 6.0–8.3)

## 2021-05-07 LAB — CBC WITH DIFFERENTIAL/PLATELET
Basophils Absolute: 0 10*3/uL (ref 0.0–0.1)
Basophils Relative: 0.7 % (ref 0.0–3.0)
Eosinophils Absolute: 0.3 10*3/uL (ref 0.0–0.7)
Eosinophils Relative: 5 % (ref 0.0–5.0)
HCT: 39.1 % (ref 36.0–46.0)
Hemoglobin: 12.9 g/dL (ref 12.0–15.0)
Lymphocytes Relative: 19.7 % (ref 12.0–46.0)
Lymphs Abs: 1.3 10*3/uL (ref 0.7–4.0)
MCHC: 33 g/dL (ref 30.0–36.0)
MCV: 92.8 fl (ref 78.0–100.0)
Monocytes Absolute: 0.5 10*3/uL (ref 0.1–1.0)
Monocytes Relative: 7.4 % (ref 3.0–12.0)
Neutro Abs: 4.5 10*3/uL (ref 1.4–7.7)
Neutrophils Relative %: 67.2 % (ref 43.0–77.0)
Platelets: 185 10*3/uL (ref 150.0–400.0)
RBC: 4.22 Mil/uL (ref 3.87–5.11)
RDW: 14.2 % (ref 11.5–15.5)
WBC: 6.8 10*3/uL (ref 4.0–10.5)

## 2021-05-07 LAB — D-DIMER, QUANTITATIVE: D-Dimer, Quant: 0.83 mcg/mL FEU — ABNORMAL HIGH (ref <0.50)

## 2021-05-07 LAB — BRAIN NATRIURETIC PEPTIDE: Pro B Natriuretic peptide (BNP): 60 pg/mL (ref 0.0–100.0)

## 2021-05-07 MED ORDER — METHYLPREDNISOLONE ACETATE 80 MG/ML IJ SUSP
80.0000 mg | Freq: Once | INTRAMUSCULAR | Status: AC
  Administered 2021-05-07: 15:00:00 80 mg via INTRAMUSCULAR

## 2021-05-07 NOTE — Patient Instructions (Signed)
Order- CXR    dx Asthma exacerbation after Covid infection  Order- Depo 3     dx Asthma exacerbation  Order lab- D-dimer, BNP, CBC w diff, CMET      dx dyspnea on exertion, covid infection  Please call if we can help

## 2021-05-08 NOTE — Assessment & Plan Note (Signed)
Exacerbation after Covid infection. Consider CHF, pneumonia. Less likely PE. Plan depo 80, CXR, D-dimer, BNP, CBC, chemistry panel.

## 2021-05-08 NOTE — Assessment & Plan Note (Signed)
Managed by Dr Maxwell Caul. She reports no concerns.

## 2021-05-10 ENCOUNTER — Other Ambulatory Visit: Payer: Self-pay

## 2021-05-10 DIAGNOSIS — R7989 Other specified abnormal findings of blood chemistry: Secondary | ICD-10-CM

## 2021-05-15 ENCOUNTER — Other Ambulatory Visit: Payer: Self-pay

## 2021-05-15 ENCOUNTER — Ambulatory Visit
Admission: RE | Admit: 2021-05-15 | Discharge: 2021-05-15 | Disposition: A | Discharge status: Home / Self Care | Payer: Medicare Other | Source: Ambulatory Visit | Attending: Internal Medicine | Admitting: Internal Medicine

## 2021-05-15 DIAGNOSIS — I2510 Atherosclerotic heart disease of native coronary artery without angina pectoris: Secondary | ICD-10-CM | POA: Diagnosis not present

## 2021-05-15 DIAGNOSIS — J479 Bronchiectasis, uncomplicated: Secondary | ICD-10-CM | POA: Diagnosis not present

## 2021-05-15 DIAGNOSIS — R7989 Other specified abnormal findings of blood chemistry: Secondary | ICD-10-CM

## 2021-05-15 DIAGNOSIS — I898 Other specified noninfective disorders of lymphatic vessels and lymph nodes: Secondary | ICD-10-CM | POA: Diagnosis not present

## 2021-05-15 DIAGNOSIS — K449 Diaphragmatic hernia without obstruction or gangrene: Secondary | ICD-10-CM | POA: Diagnosis not present

## 2021-05-15 MED ORDER — IOPAMIDOL (ISOVUE-370) INJECTION 76%
75.0000 mL | Freq: Once | INTRAVENOUS | Status: AC | PRN
  Administered 2021-05-15: 15:00:00 75 mL via INTRAVENOUS

## 2021-06-06 DIAGNOSIS — M25511 Pain in right shoulder: Secondary | ICD-10-CM | POA: Diagnosis not present

## 2021-06-13 DIAGNOSIS — Z8262 Family history of osteoporosis: Secondary | ICD-10-CM | POA: Diagnosis not present

## 2021-06-13 DIAGNOSIS — N958 Other specified menopausal and perimenopausal disorders: Secondary | ICD-10-CM | POA: Diagnosis not present

## 2021-06-13 DIAGNOSIS — R29890 Loss of height: Secondary | ICD-10-CM | POA: Diagnosis not present

## 2021-06-13 DIAGNOSIS — M8588 Other specified disorders of bone density and structure, other site: Secondary | ICD-10-CM | POA: Diagnosis not present

## 2021-06-28 DIAGNOSIS — K219 Gastro-esophageal reflux disease without esophagitis: Secondary | ICD-10-CM | POA: Diagnosis not present

## 2021-06-28 DIAGNOSIS — K9089 Other intestinal malabsorption: Secondary | ICD-10-CM | POA: Diagnosis not present

## 2021-06-28 DIAGNOSIS — H04122 Dry eye syndrome of left lacrimal gland: Secondary | ICD-10-CM | POA: Diagnosis not present

## 2021-06-28 DIAGNOSIS — K625 Hemorrhage of anus and rectum: Secondary | ICD-10-CM | POA: Diagnosis not present

## 2021-07-04 DIAGNOSIS — K219 Gastro-esophageal reflux disease without esophagitis: Secondary | ICD-10-CM | POA: Diagnosis not present

## 2021-07-18 DIAGNOSIS — R030 Elevated blood-pressure reading, without diagnosis of hypertension: Secondary | ICD-10-CM | POA: Diagnosis not present

## 2021-07-18 DIAGNOSIS — S32009K Unspecified fracture of unspecified lumbar vertebra, subsequent encounter for fracture with nonunion: Secondary | ICD-10-CM | POA: Diagnosis not present

## 2021-07-18 DIAGNOSIS — Z6838 Body mass index (BMI) 38.0-38.9, adult: Secondary | ICD-10-CM | POA: Diagnosis not present

## 2021-07-19 ENCOUNTER — Other Ambulatory Visit: Payer: Self-pay | Admitting: Neurological Surgery

## 2021-08-02 NOTE — Progress Notes (Signed)
Surgical Instructions ? ? ? Your procedure is scheduled on Tuesday March 21st. ? Report to [facility] Main Entrance "A" at 9:30 A.M., then check in with the Admitting office. ? Call this number if you have problems the morning of surgery: ? [phone] ? ? If you have any questions prior to your surgery date call [phone]: Open Monday-Friday 8am-4pm ? ? ? Remember: ? Do not eat after midnight the night before your surgery ? ?You may drink clear liquids until 8:30am the morning of your surgery.   ?Clear liquids allowed are: Water, Non-Citrus Juices (without pulp), Carbonated Beverages, Clear Tea, Black Coffee ONLY (NO MILK, CREAM OR POWDERED CREAMER of any kind), and Gatorade ? ? ?Enhanced Recovery after Surgery for Orthopedics ?Enhanced Recovery after Surgery is a protocol used to improve the stress on your body and your recovery after surgery. ? ?Patient Instructions ? ?The day of surgery (if you do NOT have diabetes):  ?Drink ONE (1) Pre-Surgery Clear Ensure by ___8:30__ am the morning of surgery   ?This drink was given to you during your hospital  ?pre-op appointment visit. ?Nothing else to drink after completing the  ?Pre-Surgery Clear Ensure. ? ? ?       If you have questions, please contact your surgeon?s office. ? ?  ? Take these medicines the morning of surgery with A SIP OF WATER: ?Esomeprazole Magnesium (NEXIUM 24HR PO) ?Fluticasone-Umeclidin-Vilant (TRELEGY ELLIPTA) 100-62.5-25 MCG/INH AEPB - please bring with you to the hospital ?pregabalin (LYRICA) 100 MG capsule ?Propylene Glycol (SYSTANE BALANCE OP) ? ?IF NEEDED ?albuterol (VENTOLIN HFA) 108 (90 Base) MCG/ACT inhaler - please bring with you to the hospital ?LORazepam (ATIVAN) 0.5 MG tablet ? ? ? ? ?As of today, STOP taking any Aspirin (unless otherwise instructed by your surgeon) Voltaren, Aleve, Naproxen, Ibuprofen, Motrin, Advil, Goody's, BC's, all herbal medications, fish oil, and all vitamins. ? ?         ?Do not wear jewelry or makeup ?Do  not wear lotions, powders, perfumes, or deodorant. ?Do not shave 48 hours prior to surgery.  Do not bring valuables to the hospital. ?Do not wear nail polish, gel polish, artificial nails, or any other type of covering on natural nails (fingers and toes) ?If you have artificial nails or gel coating that need to be removed by a nail salon, please have this removed prior to surgery. Artificial nails or gel coating may interfere with anesthesia's ability to adequately monitor your vital signs. ? ?[facility] is not responsible for any belongings or valuables. .  ? ?Do NOT Smoke (Tobacco/Vaping)  24 hours prior to your procedure ? ?If you use a CPAP at night, you may bring your mask for your overnight stay. ?  ?Contacts, glasses, hearing aids, dentures or partials may not be worn into surgery, please bring cases for these belongings ?  ?For patients admitted to the hospital, discharge time will be determined by your treatment team. ?  ?Patients discharged the day of surgery will not be allowed to drive home, and someone needs to stay with them for 24 hours. ? ?NO VISITORS WILL BE ALLOWED IN PRE-OP WHERE PATIENTS ARE PREPPED FOR SURGERY.  ONLY 1 SUPPORT PERSON MAY BE PRESENT IN THE WAITING ROOM WHILE YOU ARE IN SURGERY.  IF YOU ARE TO BE ADMITTED, ONCE YOU ARE IN YOUR ROOM YOU WILL BE ALLOWED TWO (2) VISITORS. 1 (ONE) VISITOR MAY STAY OVERNIGHT BUT MUST ARRIVE TO THE ROOM BY 8pm.  Minor children may have two parents  present. Special consideration for safety and communication needs will be reviewed on a case by case basis. ? ?Special instructions:   ? ?Oral Hygiene is also important to reduce your risk of infection.  Remember - BRUSH YOUR TEETH THE MORNING OF SURGERY WITH YOUR REGULAR TOOTHPASTE ? ? ?[facility]- Preparing For Surgery ? ?Before surgery, you can play an important role. Because skin is not sterile, your skin needs to be as free of germs as possible. You can reduce the number of germs on your skin by washing  with CHG (chlorahexidine gluconate) Soap before surgery.  CHG is an antiseptic cleaner which kills germs and bonds with the skin to continue killing germs even after washing.   ? ? ?Please do not use if you have an allergy to CHG or antibacterial soaps. If your skin becomes reddened/irritated stop using the CHG.  ?Do not shave (including legs and underarms) for at least 48 hours prior to first CHG shower. It is OK to shave your face. ? ?Please follow these instructions carefully. ?  ? ? Shower the NIGHT BEFORE SURGERY and the MORNING OF SURGERY with CHG Soap.  ? If you chose to wash your hair, wash your hair first as usual with your normal shampoo. After you shampoo, rinse your hair and body thoroughly to remove the shampoo.  Then [employer] and genitals (private parts) with your normal soap and rinse thoroughly to remove soap. ? ?After that Use CHG Soap as you would any other liquid soap. You can apply CHG directly to the skin and wash gently with a scrungie or a clean washcloth.  ? ?Apply the CHG Soap to your body ONLY FROM THE NECK DOWN.  Do not use on open wounds or open sores. Avoid contact with your eyes, ears, mouth and genitals (private parts). Wash Face and genitals (private parts)  with your normal soap.  ? ?Wash thoroughly, paying special attention to the area where your surgery will be performed. ? ?Thoroughly rinse your body with warm water from the neck down. ? ?DO NOT shower/wash with your normal soap after using and rinsing off the CHG Soap. ? ?Pat yourself dry with a CLEAN TOWEL. ? ?Wear CLEAN PAJAMAS to bed the night before surgery ? ?Place CLEAN SHEETS on your bed the night before your surgery ? ?DO NOT SLEEP WITH PETS. ? ? ?Day of Surgery: ? ?Take a shower with CHG soap. ?Wear Clean/Comfortable clothing the morning of surgery ?Do not apply any deodorants/lotions.   ?Remember to brush your teeth WITH YOUR REGULAR TOOTHPASTE. ? ? ? ?COVID testing ? ?If you are going to stay overnight or be admitted  after your procedure/surgery and require a pre-op COVID test, please follow these instructions after your COVID test  ? ?You are not required to quarantine however you are required to wear a well-fitting mask when you are out and around people not in your household.  If your mask becomes wet or soiled, replace with a new one. ? ?Wash your hands often with soap and water for 20 seconds or clean your hands with an alcohol-based hand sanitizer that contains at least 60% alcohol. ? ?Do not share personal items. ? ?Notify your provider: ?if you are in close contact with someone who has COVID  ?or if you develop a fever of 100.4 or greater, sneezing, cough, sore throat, shortness of breath or body aches. ? ?  ?Please read over the following fact sheets that you were given.  ? ?

## 2021-08-03 ENCOUNTER — Encounter (HOSPITAL_COMMUNITY): Payer: Self-pay

## 2021-08-03 ENCOUNTER — Encounter (HOSPITAL_COMMUNITY)
Admission: RE | Admit: 2021-08-03 | Discharge: 2021-08-03 | Disposition: A | Discharge status: Home / Self Care | Payer: Medicare Other | Source: Ambulatory Visit | Attending: Neurological Surgery | Admitting: Neurological Surgery

## 2021-08-03 ENCOUNTER — Other Ambulatory Visit: Payer: Self-pay

## 2021-08-03 VITALS — BP 145/74 | HR 68 | Temp 98.0°F | Resp 17 | Ht 64.0 in | Wt 220.5 lb

## 2021-08-03 DIAGNOSIS — D509 Iron deficiency anemia, unspecified: Secondary | ICD-10-CM | POA: Insufficient documentation

## 2021-08-03 DIAGNOSIS — I700 Atherosclerosis of aorta: Secondary | ICD-10-CM | POA: Insufficient documentation

## 2021-08-03 DIAGNOSIS — K449 Diaphragmatic hernia without obstruction or gangrene: Secondary | ICD-10-CM | POA: Insufficient documentation

## 2021-08-03 DIAGNOSIS — Z85828 Personal history of other malignant neoplasm of skin: Secondary | ICD-10-CM | POA: Insufficient documentation

## 2021-08-03 DIAGNOSIS — Z8673 Personal history of transient ischemic attack (TIA), and cerebral infarction without residual deficits: Secondary | ICD-10-CM | POA: Diagnosis not present

## 2021-08-03 DIAGNOSIS — Z96651 Presence of right artificial knee joint: Secondary | ICD-10-CM | POA: Insufficient documentation

## 2021-08-03 DIAGNOSIS — E7800 Pure hypercholesterolemia, unspecified: Secondary | ICD-10-CM | POA: Insufficient documentation

## 2021-08-03 DIAGNOSIS — R002 Palpitations: Secondary | ICD-10-CM | POA: Insufficient documentation

## 2021-08-03 DIAGNOSIS — Z8616 Personal history of COVID-19: Secondary | ICD-10-CM | POA: Insufficient documentation

## 2021-08-03 DIAGNOSIS — Z7982 Long term (current) use of aspirin: Secondary | ICD-10-CM | POA: Insufficient documentation

## 2021-08-03 DIAGNOSIS — M960 Pseudarthrosis after fusion or arthrodesis: Secondary | ICD-10-CM | POA: Insufficient documentation

## 2021-08-03 DIAGNOSIS — Z01818 Encounter for other preprocedural examination: Secondary | ICD-10-CM

## 2021-08-03 DIAGNOSIS — Z9049 Acquired absence of other specified parts of digestive tract: Secondary | ICD-10-CM | POA: Diagnosis not present

## 2021-08-03 DIAGNOSIS — Y939 Activity, unspecified: Secondary | ICD-10-CM | POA: Insufficient documentation

## 2021-08-03 DIAGNOSIS — J439 Emphysema, unspecified: Secondary | ICD-10-CM | POA: Insufficient documentation

## 2021-08-03 DIAGNOSIS — Z01812 Encounter for preprocedural laboratory examination: Secondary | ICD-10-CM | POA: Diagnosis not present

## 2021-08-03 DIAGNOSIS — M1711 Unilateral primary osteoarthritis, right knee: Secondary | ICD-10-CM | POA: Diagnosis not present

## 2021-08-03 DIAGNOSIS — S32009K Unspecified fracture of unspecified lumbar vertebra, subsequent encounter for fracture with nonunion: Secondary | ICD-10-CM

## 2021-08-03 DIAGNOSIS — R0609 Other forms of dyspnea: Secondary | ICD-10-CM | POA: Insufficient documentation

## 2021-08-03 DIAGNOSIS — G629 Polyneuropathy, unspecified: Secondary | ICD-10-CM | POA: Insufficient documentation

## 2021-08-03 DIAGNOSIS — J479 Bronchiectasis, uncomplicated: Secondary | ICD-10-CM | POA: Insufficient documentation

## 2021-08-03 DIAGNOSIS — Z7951 Long term (current) use of inhaled steroids: Secondary | ICD-10-CM | POA: Insufficient documentation

## 2021-08-03 DIAGNOSIS — Z9989 Dependence on other enabling machines and devices: Secondary | ICD-10-CM | POA: Insufficient documentation

## 2021-08-03 DIAGNOSIS — G4733 Obstructive sleep apnea (adult) (pediatric): Secondary | ICD-10-CM | POA: Insufficient documentation

## 2021-08-03 DIAGNOSIS — Y838 Other surgical procedures as the cause of abnormal reaction of the patient, or of later complication, without mention of misadventure at the time of the procedure: Secondary | ICD-10-CM | POA: Insufficient documentation

## 2021-08-03 LAB — BASIC METABOLIC PANEL
Anion gap: 9 (ref 5–15)
BUN: 13 mg/dL (ref 8–23)
CO2: 26 mmol/L (ref 22–32)
Calcium: 9.2 mg/dL (ref 8.9–10.3)
Chloride: 105 mmol/L (ref 98–111)
Creatinine, Ser: 1.07 mg/dL — ABNORMAL HIGH (ref 0.44–1.00)
GFR, Estimated: 53 mL/min — ABNORMAL LOW (ref >60)
Glucose, Bld: 92 mg/dL (ref 70–99)
Potassium: 4.7 mmol/L (ref 3.5–5.1)
Sodium: 140 mmol/L (ref 135–145)

## 2021-08-03 LAB — CBC
HCT: 42.2 % (ref 36.0–46.0)
Hemoglobin: 14 g/dL (ref 12.0–15.0)
MCH: 31 pg (ref 26.0–34.0)
MCHC: 33.2 g/dL (ref 30.0–36.0)
MCV: 93.6 fL (ref 80.0–100.0)
Platelets: 222 10*3/uL (ref 150–400)
RBC: 4.51 MIL/uL (ref 3.87–5.11)
RDW: 13.1 % (ref 11.5–15.5)
WBC: 7.6 10*3/uL (ref 4.0–10.5)
nRBC: 0 % (ref 0.0–0.2)

## 2021-08-03 LAB — SURGICAL PCR SCREEN
MRSA, PCR: NEGATIVE
Staphylococcus aureus: POSITIVE — AB

## 2021-08-03 LAB — TYPE AND SCREEN
ABO/RH(D): A POS
Antibody Screen: NEGATIVE

## 2021-08-03 NOTE — Progress Notes (Addendum)
PCP - Dr. Seward Carol ?Doesn't see a cardiologist ? ?Chest x-ray - Not indicated ?EKG - Not indicated ?Stress Test - Yes "a long time ago, twenty years ago" ?ECHO - 08/04/12 CE Dr. Daneen Schick ?Cardiac Cath - 07/09/12 Yes same time as stress test "false positive", chest pain d/t hormones Dr. Daneen Schick ? ?Sleep Study - Yes has OSA ?CPAP - Uses nightly ? ?DM - Denies ? ?Aspirin Instructions: Has stopped since Tuesday 3/14 per her surgeons instruction ? ?ERAS Protcol -Yes ?PRE-SURGERY Ensure given ? ?Anesthesia review: Yes some cardiac history ? ?Patient denies shortness of breath, fever, cough and chest pain at PAT appointment ? ? ?All instructions explained to the patient, with a verbal understanding of the material. Patient agrees to go over the instructions while at home for a better understanding. The opportunity to ask questions was provided. ? ? ?

## 2021-08-06 NOTE — Progress Notes (Signed)
Pt aware of arrival time change to 0530 ?

## 2021-08-06 NOTE — Anesthesia Preprocedure Evaluation (Addendum)
Anesthesia Evaluation  ?Patient identified by MRN, date of birth, ID band ?Patient awake ? ? ? ?Reviewed: ?Allergy & Precautions, H&P , NPO status , Patient's Chart, lab work & pertinent test results ? ?History of Anesthesia Complications ?(+) PONV and history of anesthetic complications ? ?Airway ?Mallampati: II ? ? ?Neck ROM: full ? ? ? Dental ?  ?Pulmonary ?shortness of breath, asthma , sleep apnea , COPD,  ?  ?breath sounds clear to auscultation ? ? ? ? ? ? Cardiovascular ?negative cardio ROS ? ? ?Rhythm:regular Rate:Normal ? ? ?  ?Neuro/Psych ?TIACVA   ? GI/Hepatic ?hiatal hernia, GERD  ,  ?Endo/Other  ? ? Renal/GU ?  ? ?  ?Musculoskeletal ? ?(+) Arthritis ,  ? Abdominal ?  ?Peds ? Hematology ?  ?Anesthesia Other Findings ? ? Reproductive/Obstetrics ? ?  ? ? ? ? ? ? ? ? ? ? ? ? ? ?  ?  ? ? ? ? ? ? ? ? ?Anesthesia Physical ?Anesthesia Plan ? ?ASA: 3 ? ?Anesthesia Plan: General  ? ?Post-op Pain Management:   ? ?Induction: Intravenous ? ?PONV Risk Score and Plan: 4 or greater and Ondansetron, Dexamethasone and Treatment may vary due to age or medical condition ? ?Airway Management Planned: Oral ETT ? ?Additional Equipment:  ? ?Intra-op Plan:  ? ?Post-operative Plan: Extubation in OR ? ?Informed Consent: I have reviewed the patients History and Physical, chart, labs and discussed the procedure including the risks, benefits and alternatives for the proposed anesthesia with the patient or authorized representative who has indicated his/her understanding and acceptance.  ? ? ? ?Dental advisory given ? ?Plan Discussed with: CRNA, Anesthesiologist and Surgeon ? ?Anesthesia Plan Comments: (PAT note written 08/06/2021 by Myra Gianotti, PA-C. ?)  ? ? ? ? ? ?Anesthesia Quick Evaluation ? ?

## 2021-08-06 NOTE — Progress Notes (Signed)
Anesthesia Chart Review: ? Case: [id number] Date/Time: 08/07/21 0715  ? Procedures:  ?    Exploration and extension of fusion L1 to Pelvis with Mazor to assist for S2 A1 (Back) - RM 18 ?    APPLICATION OF ROBOTIC ASSISTANCE FOR SPINAL PROCEDURE  ? Anesthesia type: General  ? Pre-op diagnosis: Lumbar pseudoarthrosis  ? Location: [facility] ROOM 21 / [facility] OR  ? Surgeons: Kristeen Miss, MD  ? ?  ? ? ?DISCUSSION: Patient is a 81 year old female scheduled for the above procedure. ? 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  Normal coronaries in 06/2012 with incidental finding of occluded innominate artery with no evidence of subclavian steal by 06/2012 carotid US. ? ?Last pulmonology visit with Dr. Annamaria Boots on 05/07/21. She has asthma, emphysema, bronchiectasis (on CT with evidence of prior granulomatous disease 08/04/12). She had COVID-19 02/2021 and was treated for asthma exacerbation. CTA also ordered and ruled out PE.  She is on Trelegy daily.  OSA is followed by Dr. Maxwell Caul.  She reported use of CPAP nightly.  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 ? ?Last aspirin reported as 07/31/2021.  She denies shortness of breath, cough, fever, chest pain at PAT RN visit.  Anesthesia team to evaluate on the day of surgery. ? ? ?VS: BP (!) 145/74   Pulse 68   Temp 36.7 ?C (Oral)   Resp 17   Ht '5\' 4"'$  (1.626 m)   Wt 100 kg   SpO2 97%   BMI 37.85 kg/m?  ? ? ?PROVIDERS: ?Seward Carol, MD is PCP  ?- Baird Lyons, MD is pulmonologist. Last visit 05/07/21 for  ?- Nehemiah Settle, MD is Sleep Medicine provider ?- She is not routinely followed by cardiology. She was evaluated by Daneen Schick, MD is 2014.  She was admitted February 2024 for dyspnea and chest tightness.  She had had a recent abnormal nuclear perfusion study ("mixed distal anteroapical defect suggestive of LAD disease"), so coronary angiography was pursued then showing normal coronaries but incidental finding of occluded innominate artery. ? ? ?LABS: Labs reviewed: Acceptable for surgery. ?(all labs ordered are listed, but only abnormal results are displayed) ? ?Labs Reviewed  ?SURGICAL PCR SCREEN - Abnormal; Notable for the following components:  ?    Result Value  ? Staphylococcus aureus POSITIVE (*)   ? All other components within normal limits  ?BASIC METABOLIC PANEL - Abnormal; Notable for the following components:  ? Creatinine, Ser 1.07 (*)   ? GFR, Estimated 53 (*)   ? All other components within normal limits  ?CBC  ?TYPE AND SCREEN  ? ? ?PFTs > 9 years ago. "PFT [facility] 07/23/12- mild obstructive airways disease with response to bronchodilator. Normal lung volumes, normal diffusion. FVC 2.36/79%, FEV1 1.89/83%, FEV1/FVC 0.80, FEF 25-75% 1.31/69%. TLC 96%, DLCO 95%." ? ? ?IMAGES: ?CTA Chest 05/15/21: ?IMPRESSION: ?1.  No evidence of pulmonary embolus. ?2. Moderate hiatal hernia. ?3. Mild bilateral cylindrical bronchiectasis which is most prominent ?in the lower lungs possibly sequela of chronic aspiration or prior ?infection. ?4.  Aortic Atherosclerosis (ICD10-I70.0). ? ?CT L-spine 04/11/21: ?IMPRESSION: ?1. L2-S1 fusion with pseudoarthrosis appearance at L4-5 and L5-S1 ?and loosening of both S1 screws. ?2. 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 ?3. Stable to slight worsening of severe left neural foraminal ?stenosis at L5-S1. ?4. Aortic Atherosclerosis (ICD10-I70.0). ?  ? ?EKG: Last EKG tracing noted is from 07/09/12 showing minus rhythm with premature atrial complexes, low voltage QRS. ? ? ?CV: ?Notes indicate that she had an unremarkable  echocardiogram in March 2014, although report is not currently available.  ? ?US Carotid 07/17/12: ?Conclusions: ?No significant obstructive disease of the right carotid arterial system. ?No significant obstructive disease of the left carotid arterial system. ?Incidental finding of right thyroid nodule, 9 mm. ?Antegrade flow is demonstrated in the vertebral arteries bilaterally, which would indicate no subclavian steal. ? ?Cardiac cath 07/09/12: ?IMPRESSIONS:  ?1. 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 ?2. Normal coronary arteries. ?3. Normal left ventricular function with upper normal LVEDP.  ? ? ?Past Medical History:  ?Diagnosis Date  ? Arthritis   ? osteo  ? Asthma   ? Asthma   ? Basal cell carcinoma   ? face  ? COPD (chronic obstructive pulmonary disease) ([place])   ? GERD (gastroesophageal reflux disease)   ? Heart palpitations   ? History of hiatal hernia 1991  ? History of iron deficiency anemia   ? History of migraine   ? while taking birth control pills  ? Hypercholesteremia   ? Neuropathy   ? left greater than right feet  ? Obesity   ? Obstructive sleep apnea 07/09/2012  ? CPAP   ? Peripheral neuropathy   ? Pneumonia   ? as a child  ? PONV (postoperative nausea and vomiting)   ? not since 1967  ? Shortness of breath   ? with exertion  ? Stroke [facility])   ? TIA (transient ischemic attack) 2014  ? TIA (transient ischemic attack) 2014  ? no residual - no follow up needed  ? 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 ? ?Past Surgical History:  ?Procedure Laterality Date  ? BREAST BIOPSY Right   ? BREAST CYST EXCISION Left   ? CARDIAC CATHETERIZATION  07/09/2012  ? cataracts Bilateral 2013  ? this was second one  ? CHOLECYSTECTOMY N/A 02/18/2017  ? Procedure: LAPAROSCOPIC CHOLECYSTECTOMY WITH INTRAOPERATIVE CHOLANGIOGRAM;  Surgeon: Erroll Luna, MD;  Location: [facility];  Service: General;  Laterality: N/A;  ? COLONOSCOPY    ? DILATION AND CURETTAGE OF UTERUS     ? EYE SURGERY Bilateral 2014  ? cataract removal 2013 and 2014  ? FINGER SURGERY Right 1971  ? ring finger repair  ? KNEE SURGERY Right   ? arthroscopy  ? LEFT HEART CATHETERIZATION WITH CORONARY ANGIOGRAM N/A 07/09/2012  ? Procedure: LEFT HEART CATHETERIZATION WITH CORONARY ANGIOGRAM;  Surgeon: Sinclair Grooms, MD;  Location: [facility] CATH LAB;  Service: Cardiovascular;  Laterality: N/A;  ? LUMBAR FUSION    ? L3-S1  ? TONSILLECTOMY    ? TOTAL KNEE ARTHROPLASTY Right 12/06/2019  ? Procedure: TOTAL KNEE ARTHROPLASTY;  Surgeon: Gaynelle Arabian, MD;  Location: [facility];  Service: Orthopedics;  Laterality: Right;  51mn  ? TUBAL LIGATION    ? ? ?MEDICATIONS: ? albuterol (VENTOLIN HFA) 108 (90 Base) MCG/ACT inhaler  ? Ascorbic Acid (VITAMIN C) 1000 MG tablet  ? CALCIUM-VITAMIN D PO  ? Cholecalciferol (VITAMIN D) 125 MCG (5000 UT) CAPS  ? cholestyramine (QUESTRAN) 4 g packet  ? diclofenac (VOLTAREN) 75 MG EC tablet  ? diclofenac Sodium (VOLTAREN) 1 % GEL  ? Esomeprazole  Magnesium (NEXIUM 24HR PO)  ? estradiol (VIVELLE-DOT) 0.025 MG/24HR  ? Fluticasone-Umeclidin-Vilant (TRELEGY ELLIPTA) 100-62.5-25 MCG/INH AEPB  ? LORazepam (ATIVAN) 0.5 MG tablet  ? Multiple Vitamin (MULTIVITAMIN WITH MINERALS) TABS tablet  ? OVER THE COUNTER MEDICATION  ? pregabalin (LYRICA) 100 MG capsule  ? progesterone (PROMETRIUM) 100 MG capsule  ? Propylene Glycol (SYSTANE BALANCE OP)  ? zinc gluconate 50 MG tablet  ? ?No current facility-administered medications for this encounter.  ?By medication list, she is not currently taking Voltaren. ? ?Myra Gianotti, PA-C ?Surgical Short Stay/Anesthesiology ?[facility] Phone [phone] ?[facility] Phone [phone] ?08/06/2021 11:00 AM ? ? ? ? ? ? ? ? ?

## 2021-08-07 ENCOUNTER — Encounter (HOSPITAL_COMMUNITY): Payer: Self-pay | Admitting: Neurological Surgery

## 2021-08-07 ENCOUNTER — Inpatient Hospital Stay (HOSPITAL_COMMUNITY): Payer: Medicare Other | Admitting: Vascular Surgery

## 2021-08-07 ENCOUNTER — Inpatient Hospital Stay (HOSPITAL_COMMUNITY)
Admission: RE | Admit: 2021-08-07 | Discharge: 2021-08-09 | DRG: 455 | Disposition: A | Discharge status: Home / Self Care | Payer: Medicare Other | Attending: Neurological Surgery | Admitting: Neurological Surgery

## 2021-08-07 ENCOUNTER — Inpatient Hospital Stay (HOSPITAL_COMMUNITY): Payer: Medicare Other

## 2021-08-07 ENCOUNTER — Encounter (HOSPITAL_COMMUNITY)
Admission: RE | Disposition: A | Discharge status: Home / Self Care | Payer: Self-pay | Source: Home / Self Care | Attending: Neurological Surgery

## 2021-08-07 ENCOUNTER — Inpatient Hospital Stay (HOSPITAL_COMMUNITY): Payer: Medicare Other | Admitting: Anesthesiology

## 2021-08-07 ENCOUNTER — Other Ambulatory Visit: Payer: Self-pay

## 2021-08-07 DIAGNOSIS — J449 Chronic obstructive pulmonary disease, unspecified: Secondary | ICD-10-CM | POA: Diagnosis not present

## 2021-08-07 DIAGNOSIS — Z20822 Contact with and (suspected) exposure to covid-19: Secondary | ICD-10-CM | POA: Diagnosis present

## 2021-08-07 DIAGNOSIS — M5416 Radiculopathy, lumbar region: Secondary | ICD-10-CM | POA: Diagnosis present

## 2021-08-07 DIAGNOSIS — M960 Pseudarthrosis after fusion or arthrodesis: Secondary | ICD-10-CM | POA: Diagnosis present

## 2021-08-07 DIAGNOSIS — Z981 Arthrodesis status: Secondary | ICD-10-CM | POA: Diagnosis not present

## 2021-08-07 DIAGNOSIS — M48061 Spinal stenosis, lumbar region without neurogenic claudication: Secondary | ICD-10-CM | POA: Diagnosis present

## 2021-08-07 DIAGNOSIS — J45909 Unspecified asthma, uncomplicated: Secondary | ICD-10-CM | POA: Diagnosis present

## 2021-08-07 DIAGNOSIS — M47817 Spondylosis without myelopathy or radiculopathy, lumbosacral region: Secondary | ICD-10-CM | POA: Diagnosis present

## 2021-08-07 DIAGNOSIS — Z882 Allergy status to sulfonamides status: Secondary | ICD-10-CM

## 2021-08-07 DIAGNOSIS — M4326 Fusion of spine, lumbar region: Secondary | ICD-10-CM | POA: Diagnosis not present

## 2021-08-07 DIAGNOSIS — G4730 Sleep apnea, unspecified: Secondary | ICD-10-CM | POA: Diagnosis not present

## 2021-08-07 DIAGNOSIS — Z888 Allergy status to other drugs, medicaments and biological substances status: Secondary | ICD-10-CM | POA: Diagnosis not present

## 2021-08-07 DIAGNOSIS — M4726 Other spondylosis with radiculopathy, lumbar region: Secondary | ICD-10-CM | POA: Diagnosis not present

## 2021-08-07 DIAGNOSIS — Z8709 Personal history of other diseases of the respiratory system: Secondary | ICD-10-CM | POA: Diagnosis not present

## 2021-08-07 DIAGNOSIS — Y838 Other surgical procedures as the cause of abnormal reaction of the patient, or of later complication, without mention of misadventure at the time of the procedure: Secondary | ICD-10-CM | POA: Diagnosis present

## 2021-08-07 DIAGNOSIS — M4727 Other spondylosis with radiculopathy, lumbosacral region: Secondary | ICD-10-CM

## 2021-08-07 DIAGNOSIS — S32009K Unspecified fracture of unspecified lumbar vertebra, subsequent encounter for fracture with nonunion: Principal | ICD-10-CM | POA: Diagnosis present

## 2021-08-07 HISTORY — PX: APPLICATION OF ROBOTIC ASSISTANCE FOR SPINAL PROCEDURE: SHX6753

## 2021-08-07 SURGERY — POSTERIOR LUMBAR FUSION 1 LEVEL
Anesthesia: General | Site: Back

## 2021-08-07 MED ORDER — FLUTICASONE FUROATE-VILANTEROL 100-25 MCG/ACT IN AEPB
1.0000 | INHALATION_SPRAY | Freq: Every day | RESPIRATORY_TRACT | Status: DC
  Filled 2021-08-07: qty 28

## 2021-08-07 MED ORDER — SUGAMMADEX SODIUM 200 MG/2ML IV SOLN
INTRAVENOUS | Status: DC | PRN
  Administered 2021-08-07: 200 mg via INTRAVENOUS
  Administered 2021-08-07: 100 mg via INTRAVENOUS

## 2021-08-07 MED ORDER — LACTATED RINGERS IV SOLN: INTRAVENOUS | Status: DC | PRN

## 2021-08-07 MED ORDER — LIDOCAINE-EPINEPHRINE 2 %-1:100000 IJ SOLN
INTRAMUSCULAR | Status: AC
  Filled 2021-08-07: qty 1

## 2021-08-07 MED ORDER — DOCUSATE SODIUM 100 MG PO CAPS
100.0000 mg | ORAL_CAPSULE | Freq: Two times a day (BID) | ORAL | Status: DC
  Administered 2021-08-07 – 2021-08-09 (×4): 100 mg via ORAL
  Filled 2021-08-07 (×3): qty 1

## 2021-08-07 MED ORDER — PHENOL 1.4 % MT LIQD: 1.0000 | OROMUCOSAL | Status: DC | PRN

## 2021-08-07 MED ORDER — CEFAZOLIN SODIUM-DEXTROSE 2-4 GM/100ML-% IV SOLN
2.0000 g | INTRAVENOUS | Status: AC
  Administered 2021-08-07 (×2): 2 g via INTRAVENOUS

## 2021-08-07 MED ORDER — ROCURONIUM BROMIDE 10 MG/ML (PF) SYRINGE
PREFILLED_SYRINGE | INTRAVENOUS | Status: AC
  Filled 2021-08-07: qty 10

## 2021-08-07 MED ORDER — LACTATED RINGERS IV SOLN: INTRAVENOUS | Status: DC

## 2021-08-07 MED ORDER — DEXAMETHASONE SODIUM PHOSPHATE 10 MG/ML IJ SOLN
INTRAMUSCULAR | Status: DC | PRN
  Administered 2021-08-07: 10 mg via INTRAVENOUS

## 2021-08-07 MED ORDER — PROPOFOL 10 MG/ML IV BOLUS
INTRAVENOUS | Status: AC
  Filled 2021-08-07: qty 20

## 2021-08-07 MED ORDER — ALUM & MAG HYDROXIDE-SIMETH 200-200-20 MG/5ML PO SUSP: 30.0000 mL | Freq: Four times a day (QID) | ORAL | Status: DC | PRN

## 2021-08-07 MED ORDER — ORAL CARE MOUTH RINSE: 15.0000 mL | Freq: Once | OROMUCOSAL | Status: AC

## 2021-08-07 MED ORDER — CHLORHEXIDINE GLUCONATE CLOTH 2 % EX PADS: 6.0000 | MEDICATED_PAD | Freq: Once | CUTANEOUS | Status: DC

## 2021-08-07 MED ORDER — METHOCARBAMOL 1000 MG/10ML IJ SOLN
500.0000 mg | Freq: Four times a day (QID) | INTRAVENOUS | Status: DC | PRN
  Filled 2021-08-07: qty 5

## 2021-08-07 MED ORDER — POLYETHYLENE GLYCOL 3350 17 G PO PACK: 17.0000 g | PACK | Freq: Every day | ORAL | Status: DC | PRN

## 2021-08-07 MED ORDER — 0.9 % SODIUM CHLORIDE (POUR BTL) OPTIME
TOPICAL | Status: DC | PRN
Start: 2021-08-07 — End: 2021-08-07
  Administered 2021-08-07 (×2): 1000 mL

## 2021-08-07 MED ORDER — SODIUM CHLORIDE 0.9 % IV SOLN: 250.0000 mL | INTRAVENOUS | Status: DC

## 2021-08-07 MED ORDER — PHENYLEPHRINE HCL-NACL 20-0.9 MG/250ML-% IV SOLN
INTRAVENOUS | Status: DC | PRN
  Administered 2021-08-07: 25 ug/min via INTRAVENOUS

## 2021-08-07 MED ORDER — CHLORHEXIDINE GLUCONATE 0.12 % MT SOLN
OROMUCOSAL | Status: AC
  Administered 2021-08-07: 15 mL via OROMUCOSAL
  Filled 2021-08-07: qty 15

## 2021-08-07 MED ORDER — ACETAMINOPHEN 650 MG RE SUPP: 650.0000 mg | RECTAL | Status: DC | PRN

## 2021-08-07 MED ORDER — FLEET ENEMA 7-19 GM/118ML RE ENEM: 1.0000 | ENEMA | Freq: Once | RECTAL | Status: DC | PRN

## 2021-08-07 MED ORDER — ALBUTEROL SULFATE (2.5 MG/3ML) 0.083% IN NEBU: 2.5000 mg | INHALATION_SOLUTION | Freq: Four times a day (QID) | RESPIRATORY_TRACT | Status: DC | PRN

## 2021-08-07 MED ORDER — OXYCODONE HCL 5 MG/5ML PO SOLN: 5.0000 mg | Freq: Once | ORAL | Status: AC | PRN

## 2021-08-07 MED ORDER — THROMBIN 5000 UNITS EX SOLR
CUTANEOUS | Status: AC
  Filled 2021-08-07: qty 5000

## 2021-08-07 MED ORDER — FENTANYL CITRATE (PF) 250 MCG/5ML IJ SOLN
INTRAMUSCULAR | Status: AC
  Filled 2021-08-07: qty 5

## 2021-08-07 MED ORDER — MENTHOL 3 MG MT LOZG: 1.0000 | LOZENGE | OROMUCOSAL | Status: DC | PRN

## 2021-08-07 MED ORDER — ESOMEPRAZOLE MAGNESIUM 20 MG PO CPDR: 22.3000 mg | DELAYED_RELEASE_CAPSULE | Freq: Every day | ORAL | Status: DC

## 2021-08-07 MED ORDER — PROPOFOL 10 MG/ML IV BOLUS
INTRAVENOUS | Status: DC | PRN
Start: 2021-08-07 — End: 2021-08-07
  Administered 2021-08-07: 110 mg via INTRAVENOUS

## 2021-08-07 MED ORDER — ACETAMINOPHEN 500 MG PO TABS
ORAL_TABLET | ORAL | Status: AC
  Administered 2021-08-07: 1000 mg via ORAL
  Filled 2021-08-07: qty 2

## 2021-08-07 MED ORDER — PROGESTERONE MICRONIZED 100 MG PO CAPS
100.0000 mg | ORAL_CAPSULE | Freq: Every day | ORAL | Status: DC
  Administered 2021-08-07 – 2021-08-08 (×2): 100 mg via ORAL
  Filled 2021-08-07 (×3): qty 1

## 2021-08-07 MED ORDER — ACETAMINOPHEN 325 MG PO TABS: 650.0000 mg | ORAL_TABLET | ORAL | Status: DC | PRN

## 2021-08-07 MED ORDER — UMECLIDINIUM BROMIDE 62.5 MCG/ACT IN AEPB
1.0000 | INHALATION_SPRAY | Freq: Every day | RESPIRATORY_TRACT | Status: DC
  Filled 2021-08-07: qty 7

## 2021-08-07 MED ORDER — PREGABALIN 100 MG PO CAPS
100.0000 mg | ORAL_CAPSULE | Freq: Every day | ORAL | Status: DC
  Administered 2021-08-08 – 2021-08-09 (×2): 100 mg via ORAL
  Filled 2021-08-07 (×2): qty 1

## 2021-08-07 MED ORDER — DEXAMETHASONE SODIUM PHOSPHATE 10 MG/ML IJ SOLN
INTRAMUSCULAR | Status: AC
  Filled 2021-08-07: qty 1

## 2021-08-07 MED ORDER — PHENYLEPHRINE 40 MCG/ML (10ML) SYRINGE FOR IV PUSH (FOR BLOOD PRESSURE SUPPORT)
PREFILLED_SYRINGE | INTRAVENOUS | Status: DC | PRN
  Administered 2021-08-07: 120 ug via INTRAVENOUS
  Administered 2021-08-07: 80 ug via INTRAVENOUS

## 2021-08-07 MED ORDER — OXYCODONE HCL 5 MG PO TABS: 5.0000 mg | ORAL_TABLET | Freq: Once | ORAL | Status: AC | PRN

## 2021-08-07 MED ORDER — LIDOCAINE-EPINEPHRINE 1 %-1:100000 IJ SOLN
INTRAMUSCULAR | Status: AC
  Filled 2021-08-07: qty 1

## 2021-08-07 MED ORDER — PREGABALIN 100 MG PO CAPS
200.0000 mg | ORAL_CAPSULE | Freq: Every day | ORAL | Status: DC
  Administered 2021-08-07 – 2021-08-08 (×2): 200 mg via ORAL
  Filled 2021-08-07 (×2): qty 2

## 2021-08-07 MED ORDER — CEFAZOLIN SODIUM-DEXTROSE 2-4 GM/100ML-% IV SOLN
INTRAVENOUS | Status: AC
  Filled 2021-08-07: qty 100

## 2021-08-07 MED ORDER — OXYCODONE-ACETAMINOPHEN 5-325 MG PO TABS
1.0000 | ORAL_TABLET | ORAL | Status: DC | PRN
  Administered 2021-08-07 – 2021-08-09 (×6): 2 via ORAL
  Administered 2021-08-09: 1 via ORAL
  Filled 2021-08-07 (×3): qty 2
  Filled 2021-08-07: qty 1
  Filled 2021-08-07 (×3): qty 2

## 2021-08-07 MED ORDER — BUPIVACAINE HCL (PF) 0.5 % IJ SOLN
INTRAMUSCULAR | Status: DC | PRN
  Administered 2021-08-07: 10 mL

## 2021-08-07 MED ORDER — THROMBIN 5000 UNITS EX SOLR
OROMUCOSAL | Status: DC | PRN
  Administered 2021-08-07 (×2): 5 mL via TOPICAL

## 2021-08-07 MED ORDER — ONDANSETRON HCL 4 MG/2ML IJ SOLN
INTRAMUSCULAR | Status: AC
  Filled 2021-08-07: qty 2

## 2021-08-07 MED ORDER — KETAMINE HCL 50 MG/5ML IJ SOSY
PREFILLED_SYRINGE | INTRAMUSCULAR | Status: AC
  Filled 2021-08-07: qty 5

## 2021-08-07 MED ORDER — MORPHINE SULFATE (PF) 2 MG/ML IV SOLN: 2.0000 mg | INTRAVENOUS | Status: DC | PRN

## 2021-08-07 MED ORDER — FENTANYL CITRATE (PF) 100 MCG/2ML IJ SOLN
INTRAMUSCULAR | Status: AC
  Filled 2021-08-07: qty 2

## 2021-08-07 MED ORDER — ONDANSETRON HCL 4 MG/2ML IJ SOLN
INTRAMUSCULAR | Status: DC | PRN
  Administered 2021-08-07: 4 mg via INTRAVENOUS

## 2021-08-07 MED ORDER — ROCURONIUM BROMIDE 10 MG/ML (PF) SYRINGE
PREFILLED_SYRINGE | INTRAVENOUS | Status: DC | PRN
  Administered 2021-08-07: 50 mg via INTRAVENOUS
  Administered 2021-08-07 (×2): 30 mg via INTRAVENOUS
  Administered 2021-08-07 (×2): 50 mg via INTRAVENOUS

## 2021-08-07 MED ORDER — BUPIVACAINE HCL (PF) 0.5 % IJ SOLN
INTRAMUSCULAR | Status: AC
  Filled 2021-08-07: qty 30

## 2021-08-07 MED ORDER — FENTANYL CITRATE (PF) 100 MCG/2ML IJ SOLN
25.0000 ug | INTRAMUSCULAR | Status: DC | PRN
  Administered 2021-08-07: 50 ug via INTRAVENOUS
  Administered 2021-08-07: 25 ug via INTRAVENOUS

## 2021-08-07 MED ORDER — PANTOPRAZOLE SODIUM 40 MG PO TBEC
40.0000 mg | DELAYED_RELEASE_TABLET | Freq: Every day | ORAL | Status: DC
Start: 2021-08-08 — End: 2021-08-09
  Administered 2021-08-08 – 2021-08-09 (×2): 40 mg via ORAL
  Filled 2021-08-07 (×2): qty 1

## 2021-08-07 MED ORDER — PREGABALIN 100 MG PO CAPS: 100.0000 mg | ORAL_CAPSULE | ORAL | Status: DC

## 2021-08-07 MED ORDER — OXYCODONE HCL 5 MG PO TABS
ORAL_TABLET | ORAL | Status: AC
  Administered 2021-08-07: 5 mg via ORAL
  Filled 2021-08-07: qty 1

## 2021-08-07 MED ORDER — POLYVINYL ALCOHOL 1.4 % OP SOLN
1.0000 [drp] | Freq: Two times a day (BID) | OPHTHALMIC | Status: DC
  Administered 2021-08-07 – 2021-08-09 (×4): 1 [drp] via OPHTHALMIC
  Filled 2021-08-07: qty 15

## 2021-08-07 MED ORDER — FENTANYL CITRATE (PF) 250 MCG/5ML IJ SOLN
INTRAMUSCULAR | Status: DC | PRN
  Administered 2021-08-07: 100 ug via INTRAVENOUS
  Administered 2021-08-07 (×4): 50 ug via INTRAVENOUS

## 2021-08-07 MED ORDER — THROMBIN 20000 UNITS EX SOLR
CUTANEOUS | Status: DC | PRN
  Administered 2021-08-07: 20 mL via TOPICAL

## 2021-08-07 MED ORDER — SODIUM CHLORIDE 0.9 % IV SOLN: INTRAVENOUS | Status: DC | PRN

## 2021-08-07 MED ORDER — KETAMINE HCL 10 MG/ML IJ SOLN
INTRAMUSCULAR | Status: DC | PRN
  Administered 2021-08-07 (×2): 10 mg via INTRAVENOUS
  Administered 2021-08-07: 30 mg via INTRAVENOUS

## 2021-08-07 MED ORDER — LIDOCAINE 2% (20 MG/ML) 5 ML SYRINGE
INTRAMUSCULAR | Status: AC
  Filled 2021-08-07: qty 5

## 2021-08-07 MED ORDER — ACETAMINOPHEN 500 MG PO TABS: 1000.0000 mg | ORAL_TABLET | Freq: Once | ORAL | Status: AC

## 2021-08-07 MED ORDER — LIDOCAINE 2% (20 MG/ML) 5 ML SYRINGE
INTRAMUSCULAR | Status: DC | PRN
  Administered 2021-08-07: 50 mg via INTRAVENOUS

## 2021-08-07 MED ORDER — CHLORHEXIDINE GLUCONATE 0.12 % MT SOLN: 15.0000 mL | Freq: Once | OROMUCOSAL | Status: AC

## 2021-08-07 MED ORDER — SENNA 8.6 MG PO TABS
1.0000 | ORAL_TABLET | Freq: Two times a day (BID) | ORAL | Status: DC
  Administered 2021-08-07 – 2021-08-09 (×4): 8.6 mg via ORAL
  Filled 2021-08-07 (×4): qty 1

## 2021-08-07 MED ORDER — LORAZEPAM 0.5 MG PO TABS: 0.2500 mg | ORAL_TABLET | Freq: Every day | ORAL | Status: DC | PRN

## 2021-08-07 MED ORDER — CHOLESTYRAMINE 4 G PO PACK
4.0000 g | PACK | ORAL | Status: DC
  Administered 2021-08-08: 4 g via ORAL
  Filled 2021-08-07 (×2): qty 1

## 2021-08-07 MED ORDER — METHOCARBAMOL 500 MG PO TABS
500.0000 mg | ORAL_TABLET | Freq: Four times a day (QID) | ORAL | Status: DC | PRN
  Administered 2021-08-07 – 2021-08-09 (×5): 500 mg via ORAL
  Filled 2021-08-07 (×5): qty 1

## 2021-08-07 MED ORDER — CEFAZOLIN SODIUM-DEXTROSE 2-4 GM/100ML-% IV SOLN
2.0000 g | Freq: Three times a day (TID) | INTRAVENOUS | Status: AC
  Administered 2021-08-07 – 2021-08-08 (×2): 2 g via INTRAVENOUS
  Filled 2021-08-07 (×2): qty 100

## 2021-08-07 MED ORDER — THROMBIN 20000 UNITS EX SOLR
CUTANEOUS | Status: AC
  Filled 2021-08-07: qty 20000

## 2021-08-07 MED ORDER — ONDANSETRON HCL 4 MG/2ML IJ SOLN: 4.0000 mg | Freq: Four times a day (QID) | INTRAMUSCULAR | Status: DC | PRN

## 2021-08-07 MED ORDER — LACTATED RINGERS IV SOLN
INTRAVENOUS | Status: DC
Start: 2021-08-07 — End: 2021-08-07

## 2021-08-07 MED ORDER — SODIUM CHLORIDE 0.9% FLUSH: 3.0000 mL | INTRAVENOUS | Status: DC | PRN

## 2021-08-07 MED ORDER — ONDANSETRON HCL 4 MG PO TABS
4.0000 mg | ORAL_TABLET | Freq: Four times a day (QID) | ORAL | Status: DC | PRN
  Administered 2021-08-09: 4 mg via ORAL
  Filled 2021-08-07: qty 1

## 2021-08-07 MED ORDER — CEFAZOLIN SODIUM 1 G IJ SOLR
INTRAMUSCULAR | Status: AC
  Filled 2021-08-07: qty 20

## 2021-08-07 MED ORDER — SODIUM CHLORIDE 0.9% FLUSH
3.0000 mL | Freq: Two times a day (BID) | INTRAVENOUS | Status: DC
  Administered 2021-08-07 – 2021-08-08 (×3): 3 mL via INTRAVENOUS

## 2021-08-07 MED ORDER — LIDOCAINE-EPINEPHRINE 1 %-1:100000 IJ SOLN
INTRAMUSCULAR | Status: DC | PRN
Start: 2021-08-07 — End: 2021-08-07
  Administered 2021-08-07: 10 mL

## 2021-08-07 MED ORDER — BISACODYL 10 MG RE SUPP: 10.0000 mg | Freq: Every day | RECTAL | Status: DC | PRN

## 2021-08-07 SURGICAL SUPPLY — 100 items
ADH SKN CLS APL DERMABOND .7 (GAUZE/BANDAGES/DRESSINGS) ×1
APL SRG 60D 8 XTD TIP BNDBL (TIP)
BAG COUNTER SPONGE SURGICOUNT (BAG) ×6 IMPLANT
BAG SPNG CNTER NS LX DISP (BAG) ×4
BASKET BONE COLLECTION (BASKET) ×3 IMPLANT
BIT DRILL LONG 3.0X30 (BIT) ×1 IMPLANT
BIT DRILL LONG 3X80 (BIT) ×1 IMPLANT
BIT DRILL LONG 4X80 (BIT) IMPLANT
BIT DRILL SHORT 3.0X30 (BIT) IMPLANT
BIT DRILL SHORT 3X80 (BIT) IMPLANT
BLADE CLIPPER SURG (BLADE) IMPLANT
BLADE SURG 11 STRL SS (BLADE) ×3 IMPLANT
BONE CANC CHIPS 40CC CAN1/2 (Bone Implant) ×3 IMPLANT
BUR MATCHSTICK NEURO 3.0 LAGG (BURR) ×3 IMPLANT
CAGE MOD EX PL 7X9X24 17D (Cage) ×1 IMPLANT
CANISTER SUCT 3000ML PPV (MISCELLANEOUS) ×3 IMPLANT
CEMENT KYPHON C01A KIT/MIXER (Cement) ×1 IMPLANT
CHIPS CANC BONE 40CC CAN1/2 (Bone Implant) ×2 IMPLANT
CNTNR URN SCR LID CUP LEK RST (MISCELLANEOUS) ×2 IMPLANT
CONT SPEC 4OZ STRL OR WHT (MISCELLANEOUS) ×3
COVER BACK TABLE 60X90IN (DRAPES) ×3 IMPLANT
DECANTER SPIKE VIAL GLASS SM (MISCELLANEOUS) ×2 IMPLANT
DERMABOND ADVANCED (GAUZE/BANDAGES/DRESSINGS) ×1
DERMABOND ADVANCED .7 DNX12 (GAUZE/BANDAGES/DRESSINGS) ×2 IMPLANT
DEVICE DISSECT PLASMABLAD 3.0S (MISCELLANEOUS) ×2 IMPLANT
DIGITIZER BENDINI (MISCELLANEOUS) ×1 IMPLANT
DRAPE C-ARM 42X72 X-RAY (DRAPES) ×6 IMPLANT
DRAPE HALF SHEET 40X57 (DRAPES) ×1 IMPLANT
DRAPE LAPAROTOMY 100X72X124 (DRAPES) ×3 IMPLANT
DRAPE SHEET LG 3/4 BI-LAMINATE (DRAPES) ×3 IMPLANT
DRAPE WARM FLUID 44X44 (DRAPES) ×1 IMPLANT
DRSG OPSITE POSTOP 4X8 (GAUZE/BANDAGES/DRESSINGS) ×1 IMPLANT
DURAPREP 26ML APPLICATOR (WOUND CARE) ×3 IMPLANT
DURASEAL APPLICATOR TIP (TIP) IMPLANT
DURASEAL SPINE SEALANT 3ML (MISCELLANEOUS) IMPLANT
ELECT BLADE 4.0 EZ CLEAN MEGAD (MISCELLANEOUS)
ELECT REM PT RETURN 9FT ADLT (ELECTROSURGICAL) ×3
ELECTRODE BLDE 4.0 EZ CLN MEGD (MISCELLANEOUS) IMPLANT
ELECTRODE REM PT RTRN 9FT ADLT (ELECTROSURGICAL) ×2 IMPLANT
GAUZE 4X4 16PLY ~~LOC~~+RFID DBL (SPONGE) ×2 IMPLANT
GAUZE SPONGE 4X4 12PLY STRL (GAUZE/BANDAGES/DRESSINGS) ×2 IMPLANT
GLOVE SURG LTX SZ8.5 (GLOVE) ×6 IMPLANT
GLOVE SURG POLYISO LF SZ6.5 (GLOVE) ×4 IMPLANT
GLOVE SURG UNDER POLY LF SZ7.5 (GLOVE) ×4 IMPLANT
GLOVE SURG UNDER POLY LF SZ8.5 (GLOVE) ×6 IMPLANT
GOWN STRL REUS W/ TWL LRG LVL3 (GOWN DISPOSABLE) IMPLANT
GOWN STRL REUS W/ TWL XL LVL3 (GOWN DISPOSABLE) IMPLANT
GOWN STRL REUS W/TWL 2XL LVL3 (GOWN DISPOSABLE) ×6 IMPLANT
GOWN STRL REUS W/TWL LRG LVL3 (GOWN DISPOSABLE) ×3
GOWN STRL REUS W/TWL XL LVL3 (GOWN DISPOSABLE) ×3
GRAFT BNE CHIP CANC 1-8 40 (Bone Implant) IMPLANT
GRAFT BONE PROTEIOS XL 10CC (Orthopedic Implant) ×1 IMPLANT
GUIDEWIRE NITINOL BEVEL TIP (WIRE) ×4 IMPLANT
HEMOSTAT POWDER KIT SURGIFOAM (HEMOSTASIS) ×2 IMPLANT
KIT BASIN OR (CUSTOM PROCEDURE TRAY) ×3 IMPLANT
KIT GRAFTMAG DEL NEURO DISP (NEUROSURGERY SUPPLIES) IMPLANT
KIT SPINE MAZOR X ROBO DISP (MISCELLANEOUS) ×3 IMPLANT
KIT TURNOVER KIT B (KITS) ×3 IMPLANT
MILL MEDIUM DISP (BLADE) ×3 IMPLANT
NDL RELINE-OR FENS 16 (NEEDLE) IMPLANT
NDL SPNL 22GX3.5 QUINCKE BK (NEEDLE) IMPLANT
NEEDLE HYPO 22GX1.5 SAFETY (NEEDLE) ×3 IMPLANT
NEEDLE RELINE-OR FENS 16 (NEEDLE) ×6 IMPLANT
NEEDLE SPNL 22GX3.5 QUINCKE BK (NEEDLE) ×3 IMPLANT
NS IRRIG 1000ML POUR BTL (IV SOLUTION) ×4 IMPLANT
PACK LAMINECTOMY NEURO (CUSTOM PROCEDURE TRAY) ×3 IMPLANT
PAD ARMBOARD 7.5X6 YLW CONV (MISCELLANEOUS) ×6 IMPLANT
PATTIES SURGICAL .5 X1 (DISPOSABLE) ×3 IMPLANT
PIN HEAD 2.5X60MM (PIN) IMPLANT
PLASMABLADE 3.0S (MISCELLANEOUS) ×3
PUSHER RELINE FENS 36 OR (MISCELLANEOUS) ×2 IMPLANT
ROD RELINE-O 5.5X300 STRT NS (Rod) IMPLANT
ROD RELINE-O 5.5X300MM STRT (Rod) ×6 IMPLANT
SCREW CANN RELINE 9.5X40 (Screw) ×2 IMPLANT
SCREW CERV PA RELINE 8.5X80 (Screw) ×2 IMPLANT
SCREW LOCK RELINE 5.5 TULIP (Screw) ×14 IMPLANT
SCREW PA RELINE 7.5X40 (Screw) ×3 IMPLANT
SCREW PA RELINE 8.5X40 (Screw) ×1 IMPLANT
SCREW RELINE PA 2FC 5.5X45 (Screw) ×2 IMPLANT
SCREW RELINE PA 6.5X40 (Screw) ×4 IMPLANT
SCREW RELINE-O 8.5X45MM POLY (Screw) ×1 IMPLANT
SCREW SCHANZ SA 4.0MM (MISCELLANEOUS) ×1 IMPLANT
SPONGE SURGIFOAM ABS GEL 100 (HEMOSTASIS) ×3 IMPLANT
SPONGE T-LAP 4X18 ~~LOC~~+RFID (SPONGE) ×2 IMPLANT
SUT PROLENE 6 0 BV (SUTURE) IMPLANT
SUT VIC AB 0 CT1 18XCR BRD8 (SUTURE) IMPLANT
SUT VIC AB 0 CT1 8-18 (SUTURE) ×3
SUT VIC AB 1 CT1 18XBRD ANBCTR (SUTURE) ×2 IMPLANT
SUT VIC AB 1 CT1 8-18 (SUTURE) ×6
SUT VIC AB 2-0 CP2 18 (SUTURE) ×3 IMPLANT
SUT VIC AB 3-0 SH 8-18 (SUTURE) ×4 IMPLANT
SUT VIC AB 4-0 RB1 18 (SUTURE) ×6 IMPLANT
SYR 3ML LL SCALE MARK (SYRINGE) ×12 IMPLANT
TIP FENS REPLACE RELINE (MISCELLANEOUS) ×2 IMPLANT
TOWEL GREEN STERILE (TOWEL DISPOSABLE) ×3 IMPLANT
TOWEL GREEN STERILE FF (TOWEL DISPOSABLE) ×3 IMPLANT
TRAY FOLEY MTR SLVR 14FR STAT (SET/KITS/TRAYS/PACK) ×1 IMPLANT
TRAY FOLEY MTR SLVR 16FR STAT (SET/KITS/TRAYS/PACK) ×2 IMPLANT
TUBE MAZOR SA REDUCTION (TUBING) ×3 IMPLANT
WATER STERILE IRR 1000ML POUR (IV SOLUTION) ×3 IMPLANT

## 2021-08-07 NOTE — Anesthesia Procedure Notes (Signed)
Procedure Name: Intubation ?Date/Time: 08/07/2021 8:23 AM ?Performed by: Lorie Phenix, CRNA ?Pre-anesthesia Checklist: Patient identified, Emergency Drugs available, Suction available and Patient being monitored ?Patient Re-evaluated:Patient Re-evaluated prior to induction ?Oxygen Delivery Method: Circle system utilized ?Preoxygenation: Pre-oxygenation with 100% oxygen ?Induction Type: IV induction ?Ventilation: Mask ventilation without difficulty ?Laryngoscope Size: Mac and 3 ?Grade View: Grade I ?Tube type: Oral ?Tube size: 7.0 mm ?Number of attempts: 1 ?Airway Equipment and Method: Stylet ?Placement Confirmation: ETT inserted through vocal cords under direct vision, positive ETCO2 and breath sounds checked- equal and bilateral ?Secured at: 22 cm ?Tube secured with: Tape ?Dental Injury: Teeth and Oropharynx as per pre-operative assessment  ? ? ? ? ?

## 2021-08-07 NOTE — H&P (Signed)
?CHIEF COMPLAINT: ?Localized back pain over the posterior suprailiac crest region, history of fusion. ? ?HISTORY OF PRESENT ILLNESS: ?Ms. Christy Stephenson is a 81 year old right-handed individual who has been a patient of Dr. Elta Guadeloupe Roy's. Back in 2015, she had undergone surgery to correct the coronal deformity in addition to decompress significant spondylitic stenosis.  She underwent a fusion from L2 to the sacrum with posterior interbody technique.  She did well after that surgery and was able to wean off medication and resume more or less normal activities. Here lately, she has noted that she is developing increasing pain in the region of the posterior superior iliac crest just below the level of the belt line.  She points to these 2 areas specifically where she has the bulk of her pain.  She notes that sometimes it is on the left side and sometimes it is on the right side, but it tends to vary from side-to-side.  She finds that the right side is involved more frequently.  Her last radiographs were done at [facility], but I do not have any imaging studies since her surgeries.    ? ?Today in the office to further her workup, I obtained an AP and lateral radiograph of the lumbar spine with a flexion and extension view and an AP of the pelvis.  The AP of the pelvis demonstrates that both her hip joints are normal.  The SI joints show a little bit of arthritis in them.  Her lower fusion is noted but I do note that there is a halo formation around the S1 screws.  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  These findings are recapitulated on the AP of the lumbar spine.  In addition, on the lateral views I note that there is no evidence of any gross motion across the L5-S1 joint and the interbody spacer is in good position.  The other fusions from L2 to L5 also appear to be quite stable. At no other level is the halo formation noted other than the sacrum. ? ?PAST MEDICAL HISTORY: ?Reveals that her general health has been  good.  She has some asthma. ? ?CURRENT MEDICATIONS: ?Include Gabapentin 600 mg q.i.d. for neuropathy, Nexium, Diclofenac, Estradiol, Progesterone, Questran, and Voltaren cream for rotator cuff tear on that right side. ? ?SYSTEMS REVIEW: ?Notable for the arthritis and back pain on a 14-point review sheet. ? ?PHYSICAL EXAM: ?Musculoskeletal: I note that Christy Stephenson stands straight and erect.  She walks without difficulty and demonstrates no significant motor deficits in the iliopsoas, quadriceps, tibialis anterior, or gastrocs as noted by her capacity to toe and heel walk.  Her coronal posture is good. ? 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 Lumbar radiculopathy. ?Discusssion: ?  Back in November, when I visited, I had a plan to order a CT scan of the lumbar spine, and this has been performed.  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  The L2-3 and 3-4 fusions appear to be healed nicely.  On the CT scan, it is evident that Christy Stephenson also has developed an advanced degenerative process at L1-L2.  There is high-grade sclerosis with some right more than left foraminal stenosis.  The adjacent level disease needs to be addressed at the time of surgery and I believe that Christy Stephenson should undergo posterolateral arthrodesis at this level.  The interbody space is very narrowed and would be very difficult to access.  I would  advise the addition of posterolateral fusion at L1 and 2, in addition to the revision pseudoarthrosis with local autograft, allograft, and some Proteus in the lumbosacral junction.  This is a substantial undertaking and Christy Stephenson would require a fixation to the pelvis.  We have the robotic protocol so that we can use this for planning her surgical intervention and we will try to schedule this at the earliest convenience.  I did note that the surgery would be more extensive and involved than initially  planned for a simple revision of the lower lumbar spine, but given the advancement of the adjacent level disease, it would be remiss to leave that alone in Christy Stephenson's situation.  She notes that her back pain has been getting progressively worse.   ? ?

## 2021-08-07 NOTE — TOC Initial Note (Signed)
Transition of Care (TOC) - Initial/Assessment Note  ? ? ?Patient Details  ?Name: Christy Stephenson ?MRN: [id number] ?Date of Birth: 1940-12-04 ? ?Transition of Care (TOC) CM/SW Contact:    ?Marilu Favre, RN ?Phone Number: ?08/07/2021, 3:49 PM ? ?Clinical Narrative:                 ? ? ? Dr Clarice Pole office has arranged home health services with [facility]  ?  ? ? ?Patient Goals and CMS Choice ?  ?  ?  ? ?Expected Discharge Plan and Services ?  ?  ?  ?  ?  ?                ?  ?  ?  ?  ?  ?  ?  ?  ?  ?  ? ?Prior Living Arrangements/Services ?  ?  ?  ?       ?  ?  ?  ?  ? ?Activities of Daily Living ?  ?  ? ?Permission Sought/Granted ?  ?  ?   ?   ?   ?   ? ?Emotional Assessment ?  ?  ?  ?  ?  ?  ? ?Admission diagnosis:  Pseudoarthrosis of lumbar spine [S32.009K] ?Patient Active Problem List  ? Diagnosis Date Noted  ? 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 OA (osteoarthritis) of knee 12/06/2019  ? 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 Dyspnea 07/09/2012  ? Abnormal finding on cardiovascular stress test 07/09/2012  ? OTHER CHEST PAIN 03/01/2010  ? ELECTROCARDIOGRAM, ABNORMAL 03/01/2010  ? 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 Asthma with bronchitis 02/28/2010  ? Obstructive sleep apnea 02/28/2010  ? MEMORY LOSS 02/28/2010  ? ?PCP:  Seward Carol, MD ?Pharmacy:   ?[facility] [place] - [id number] ?[place] ?[place] [address] ?Phone: [phone] Fax: [phone] ? ? ? ? ?Social Determinants of Health (SDOH) Interventions ?  ? ?Readmission Risk Interventions ?No flowsheet data found. ? ? ?

## 2021-08-07 NOTE — Anesthesia Postprocedure Evaluation (Signed)
Anesthesia Post Note ? ?Patient: KORENE DULA ? ?Procedure(s) Performed: Exploration and extension of fusion with segmented fixation Lumbar one to Pelvis with Mazor: decompression lumbar one-two revision psuedoarthrosis lumbar four to five sacral one ,transforaminal lumbar interbody fusion lumbar one-two with allograft (Back) ?APPLICATION OF ROBOTIC ASSISTANCE FOR SPINAL PROCEDURE ? ?  ? ?Patient location during evaluation: PACU ?Anesthesia Type: General ?Level of consciousness: awake ?Pain management: pain level controlled ?Vital Signs Assessment: post-procedure vital signs reviewed and stable ?Respiratory status: spontaneous breathing, nonlabored ventilation, respiratory function stable and patient connected to nasal cannula oxygen ?Cardiovascular status: blood pressure returned to baseline and stable ?Postop Assessment: no apparent nausea or vomiting ?Anesthetic complications: no ? ? ?No notable events documented. ? ?Last Vitals:  ?Vitals:  ? 08/07/21 1624 08/07/21 1913  ?BP: (!) 146/127 128/80  ?Pulse: 70 69  ?Resp: 18 18  ?Temp:  36.6 ?C  ?SpO2: 100% 99%  ?  ?Last Pain:  ?Vitals:  ? 08/07/21 2020  ?TempSrc:   ?PainSc: 7   ? ? ?  ?  ?  ?  ?  ?  ? ?Albertia Carvin P Anntonette Madewell ? ? ? ? ?

## 2021-08-07 NOTE — Op Note (Addendum)
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  Back pain lumbar radiculopathy. ?Postoperative diagnosis: Same ?Procedure: Exploration and revision of fixation of the lumbar spine.  Decompression of L1-L2 using a TLIF technique with interbody spacer placed on the right side at the L1-L2 level interbody arthrodesis with autograft allograft and Proteos.  Pedicle fixation L1-S2 and the ileum with robotic assistance in placing pedicle screws in L1 and S2 AI posterior arthrodesis L4 to sacrum with local autograft allograft and Proteus. ?Surgeon: Kristeen Miss ?First Assistant: Deri Fuelling, MD ?Anesthesia: General endotracheal ?Indications: Christy Stephenson is a 81 year old individual who has had significant problems with back and bilateral leg pain she has adjacent level disease at L1-L2 above her fusion L2 to the sacrum at L4-5 and L5-S1 however she has a pseudoarthrosis with loosening of the hardware that is noted she has been advised regarding these findings and is now admitted to the operating room to undergo surgical evaluation of her arthrodesis and fusion to include fixation to the pelvis.  This will be done with robotic assistance. ? ?Procedure the patient was brought to the operating room supine on the stretcher.  After the smooth induction of general endotracheal anesthesia she was carefully turned prone onto the [facility] table.  Bony prominences were appropriately padded and protected.  The robotic arm was then attached to the [place] table and it was prepared for use later in the surgery.  The back was then prepped with alcohol DuraPrep and draped in a sterile fashion.  A large incision was made in the lumbar spine in the midline.  Previous surgeries were done through [facility] incisions.  The midline dissection was carried down to the lumbodorsal fascia which was opened on either side of midline and the first identifiable  hardware was noted on either side and this was then skeletonized to expose the superior extent at L2 and the inferior extent down to the sacrum.  Once the hardware was isolated a Schanz pin was placed into the posterior superior iliac crest on the left and the robotic arm was attached to this.  Registration radiographs were obtained in the AP and oblique orientation and when registration was completed pedicle entry sites were chosen for S2 AI screws bilaterally.  These were then drilled and a K wire was passed into the S2 AI region.  8.5 x 80 mm screws were placed over the K wires.  Then K wires were placed robotically into the L1 pedicles.  4.5 x 45 millimeter screws were then placed into L1 these were cannulated screws for placement of methacrylate cement.  Then using fluoroscopic guidance 1 cc of sacroiliac was injected into each screw under fluoroscopic guidance.  The towers were then removed from L1 and when fluoroscopic confirmation of good placement of the screws in L1 and S2 AI was obtained the robot was disarticulated from the table and we continued the surgery freehand.  At L1-L2 laminectomy was created laterally removing the 2.  Portion of the pars and lamina to expose the lateral aspect of the dura.  The path of the L1 nerve root was identified and protected and then the disc space was identified this was opened with a #15 blade and a combination of some curettes and rongeurs were used to enter the disc space and osteophyte tool was required to remove significant osteophytosis from the inferior margin of L1.  The disc space could be GEN bleeding mobilize and then ultimately  a 6 mm disc shaver was inserted into the disc space through remove and loosen what little but markedly degenerated disc there was in the space the interspace was then opened using a laminar spreader on the contralateral side to allow placement of a 7 mm tall spacer this was a 7 x 9 x 24 mm expandable spacer which was placed in the  collapse position after being filled with local autograft allograft and Proteus.  About 1 cc of bone graft was placed into the interspace also.  Once this was accomplished the lateral decompression was noted to be quite adequate at the L1-L2 level.  The rods were removed from both sets of screws and it was noted then that the L2 screw had a slight degree of loosening as did L L5 and the sacral screws.  The L3 and L4 screws were tight but there were of a different NuVasive lineage and they were removed and replaced with 6.5 x 40 mm NuVasive screws of the newer style.  At L5 on the right I placed 7.5 x 45 mm screw and L5 on the left had an 8.5 x 45 mm screw S1 on the right had a 9.5 x 40 mm screw placed in S1 on the left had a 9.5 x 40 mm screw placed also L2 was replaced with a 6.5 x 40 mm screw on the right and a 7.5 x 40 mm screw on the left 5.5 x 45 mm screws of course had already been placed at L1.  Once the screws were placed we used bending need to measure the rod on the right side 175 mm rod was contoured using bending any and on the left 280 mm rod was contoured similarly these were contoured in a neutral construct with the patient lying prone.  Then the intertransverse space at L1-L2 on the right side was packed with autograft allograft and Proteus as was the interlaminar space on the left side at L1-L2.  Further grafting was performed from L4 to the sacral ala with local autograft allograft after decorticating the posterior spinous processes and facet joints at L4-5 and L5-S1 bilaterally.  Once all the graft was laid that in the system was tightened in its neutral construct hemostasis in the soft tissues was obtained meticulously and then the lumbodorsal fascia was closed with #1 Vicryl 2-0 Vicryl was used in the subcutaneous tissues 3-0 Vicryl and 4-0 Vicryl in the subcuticular tissues the back was dressed with Dermabond and then a dry sterile honeycomb dressing was placed over this blood loss was  estimated 750 cc 250 cc of Cell Saver blood was returned to the patient. ?

## 2021-08-07 NOTE — Progress Notes (Signed)
Orthopedic Tech Progress Note ?Patient Details:  ?Christy Stephenson ?Apr 03, 1941 ?[id number] ? ?RN stated "patient has brace" .. ? ?Patient ID: Christy Stephenson, female   DOB: 05/12/1941, 81 y.o.   MRN: [id number] ? ?Christy Stephenson ?08/07/2021, 5:41 PM ? ?

## 2021-08-07 NOTE — Transfer of Care (Signed)
Immediate Anesthesia Transfer of Care Note ? ?Patient: Christy Stephenson ? ?Procedure(s) Performed: Exploration and extension of fusion with segmented fixation Lumbar one to Pelvis with Mazor: decompression lumbar one-two revision psuedoarthrosis lumbar four to five sacral one ,transforaminal lumbar interbody fusion lumbar one-two with allograft (Back) ?APPLICATION OF ROBOTIC ASSISTANCE FOR SPINAL PROCEDURE ? ?Patient Location: PACU ? ?Anesthesia Type:General ? ?Level of Consciousness: awake and alert  ? ?Airway & Oxygen Therapy: Patient Spontanous Breathing and Patient connected to face mask oxygen ? ?Post-op Assessment: Report given to RN and Post -op Vital signs reviewed and stable ? ?Post vital signs: Reviewed and stable ? ?Last Vitals:  ?Vitals Value Taken Time  ?BP 148/76 08/07/21 1440  ?Temp    ?Pulse 72 08/07/21 1443  ?Resp 26 08/07/21 1443  ?SpO2 100 % 08/07/21 1443  ?Vitals shown include unvalidated device data. ? ?Last Pain:  ?Vitals:  ? 08/07/21 0617  ?TempSrc:   ?PainSc: 0-No pain  ?   ? ?  ? ?Complications: No notable events documented. ?

## 2021-08-08 LAB — BASIC METABOLIC PANEL
Anion gap: 7 (ref 5–15)
BUN: 21 mg/dL (ref 8–23)
CO2: 24 mmol/L (ref 22–32)
Calcium: 8.5 mg/dL — ABNORMAL LOW (ref 8.9–10.3)
Chloride: 104 mmol/L (ref 98–111)
Creatinine, Ser: 0.98 mg/dL (ref 0.44–1.00)
GFR, Estimated: 59 mL/min — ABNORMAL LOW (ref >60)
Glucose, Bld: 140 mg/dL — ABNORMAL HIGH (ref 70–99)
Potassium: 4 mmol/L (ref 3.5–5.1)
Sodium: 135 mmol/L (ref 135–145)

## 2021-08-08 LAB — CBC
HCT: 32 % — ABNORMAL LOW (ref 36.0–46.0)
Hemoglobin: 10.8 g/dL — ABNORMAL LOW (ref 12.0–15.0)
MCH: 31.6 pg (ref 26.0–34.0)
MCHC: 33.8 g/dL (ref 30.0–36.0)
MCV: 93.6 fL (ref 80.0–100.0)
Platelets: 170 10*3/uL (ref 150–400)
RBC: 3.42 MIL/uL — ABNORMAL LOW (ref 3.87–5.11)
RDW: 13 % (ref 11.5–15.5)
WBC: 13.9 10*3/uL — ABNORMAL HIGH (ref 4.0–10.5)
nRBC: 0 % (ref 0.0–0.2)

## 2021-08-08 MED ORDER — DEXAMETHASONE 2 MG PO TABS
2.0000 mg | ORAL_TABLET | Freq: Two times a day (BID) | ORAL | Status: DC
  Administered 2021-08-08 – 2021-08-09 (×3): 2 mg via ORAL
  Filled 2021-08-08 (×4): qty 1

## 2021-08-08 NOTE — Evaluation (Signed)
Occupational Therapy Evaluation ?Patient Details ?Name: Christy Stephenson ?MRN: [id number] ?DOB: 07/24/1940 ?Today's Date: 08/08/2021 ? ? ?History of Present Illness 81 yo F adm 3/21 for scheduled TLIF.  PMH includes asthma, prior back surgery.  ? ?Clinical Impression ?  ?Patient with previous back surgery, has all needed DME, and continues to follow back precautions to this day.  She has no acute OT needs, and will have assist as needed from her spouse.  All precautions reviewed and no post acute OT anticipated.  Patient continues to use her back brace.     ?   ? ?Recommendations for follow up therapy are one component of a multi-disciplinary discharge planning process, led by the attending physician.  Recommendations may be updated based on patient status, additional functional criteria and insurance authorization.  ? ?Follow Up Recommendations ? No OT follow up  ?  ?Assistance Recommended at Discharge Intermittent Supervision/Assistance  ?Patient can return home with the following   ? ?  ?Functional Status Assessment ? Patient has had a recent decline in their functional status and demonstrates the ability to make significant improvements in function in a reasonable and predictable amount of time.  ?Equipment Recommendations ? None recommended by OT  ?  ?Recommendations for Other Services   ? ? ?  ?Precautions / Restrictions Precautions ?Precautions: Back ?Precaution Booklet Issued: Yes (comment) ?Required Braces or Orthoses: Spinal Brace ?Spinal Brace: Lumbar corset ?Restrictions ?Weight Bearing Restrictions: No  ? ?  ? ?Mobility Bed Mobility ?  ?  ?  ?  ?  ?  ?  ?General bed mobility comments: sitting EOB ?  ? ?Transfers ?Overall transfer level: Needs assistance ?Equipment used: Rolling walker (2 wheels) ?Transfers: Sit to/from Stand, Bed to chair/wheelchair/BSC ?Sit to Stand: Supervision ?  ?  ?Step pivot transfers: Supervision ?  ?  ?  ?  ? ?  ?Balance Overall balance assessment: Modified Independent ?  ?  ?  ?  ?   ?  ?  ?  ?  ?  ?  ?  ?  ?  ?  ?  ?  ?  ?   ? ?ADL either performed or assessed with clinical judgement  ? ?ADL   ?  ?  ?  ?  ?  ?  ?  ?  ?  ?  ?  ?  ?  ?  ?  ?  ?  ?  ?  ?General ADL Comments: Patient declines use of hip kit, spouse will assist as needed.  ? ? ? ?Vision Patient Visual Report: No change from baseline ?   ?   ?Perception Perception ?Perception: Not tested ?  ?Praxis Praxis ?Praxis: Not tested ?  ? ?Pertinent Vitals/Pain Pain Assessment ?Pain Assessment: Faces ?Faces Pain Scale: Hurts little more ?Pain Location: low back ?Pain Descriptors / Indicators: Aching ?Pain Intervention(s): Monitored during session  ? ? ? ?Hand Dominance Right ?  ?Extremity/Trunk Assessment Upper Extremity Assessment ?Upper Extremity Assessment: Overall WFL for tasks assessed ?  ?  ?  ?Cervical / Trunk Assessment ?Cervical / Trunk Assessment: Back Surgery ?  ?Communication Communication ?Communication: No difficulties ?  ?Cognition Arousal/Alertness: Awake/alert ?Behavior During Therapy: [facility] for tasks assessed/performed ?Overall Cognitive Status: Within Functional Limits for tasks assessed ?  ?  ?  ?  ?  ?  ?  ?  ?  ?  ?  ?  ?  ?  ?  ?  ?  ?  ?  ?General Comments    ? ?  ?  Exercises   ?  ?Shoulder Instructions    ? ? ?Home Living Family/patient expects to be discharged to:: Private residence ?Living Arrangements: Spouse/significant other ?Available Help at Discharge: Family;Available 24 hours/day ?Type of Home: House ?Home Access: Stairs to enter ?Entrance Stairs-Number of Steps: stair lift. ?  ?Home Layout: One level ?  ?  ?Bathroom Shower/Tub: Walk-in shower ?  ?Bathroom Toilet: Standard ?Bathroom Accessibility: Yes ?How Accessible: Accessible via walker ?Home Equipment: Conservation officer, nature (2 wheels);Shower seat;BSC/3in1;Hand held shower head;Adaptive equipment ?Adaptive Equipment: Reacher;Long-handled sponge ?  ?  ? ?  ?Prior Functioning/Environment Prior Level of Function : Independent/Modified Independent ?  ?  ?  ?  ?  ?  ?   ?  ?  ? ?  ?  ?OT Problem List: Pain ?  ?   ?OT Treatment/Interventions:    ?  ?OT Goals(Current goals can be found in the care plan section) Acute Rehab OT Goals ?Patient Stated Goal: Return home ?OT Goal Formulation: With patient ?Time For Goal Achievement: 08/08/21 ?Potential to Achieve Goals: Good  ?OT Frequency:   ?  ? ?Co-evaluation   ?  ?  ?  ?  ? ?  ?AM-PAC OT "6 Clicks" Daily Activity     ?Outcome Measure Help from another person eating meals?: None ?Help from another person taking care of personal grooming?: None ?Help from another person toileting, which includes using toliet, bedpan, or urinal?: A Little ?Help from another person bathing (including washing, rinsing, drying)?: A Little ?Help from another person to put on and taking off regular upper body clothing?: None ?Help from another person to put on and taking off regular lower body clothing?: A Little ?6 Click Score: 21 ?  ?End of Session Equipment Utilized During Treatment: Rolling walker (2 wheels) ?Nurse Communication: Mobility status ? ?Activity Tolerance: Patient tolerated treatment well ?Patient left: in bed;with call bell/phone within reach ? ?OT Visit Diagnosis: Pain  ?              ?Time: 8768-1157 ?OT Time Calculation (min): 21 min ?Charges:  OT General Charges ?$OT Visit: 1 Visit ?OT Evaluation ?$OT Eval Moderate Complexity: 1 Mod ? ?08/08/2021 ? ?RP, OTR/L ? ?Acute Rehabilitation Services ? ?Office:  [phone] ? ? ?Lezlee Gills D Ieesha Abbasi ?08/08/2021, 8:44 AM ?

## 2021-08-08 NOTE — Progress Notes (Signed)
Physical Therapy Treatment ? ?Patient Details ?Name: Christy Stephenson ?MRN: [id number] ?DOB: May 14, 1941 ?Today's Date: 08/08/2021 ? ? ?History of Present Illness Pt is a 81 y/o Female who presents s/p L1-L2 TLIF on 08/07/2021.  PMH includes asthma, prior back surgery. ? ?  ?PT Comments  ? ? Pt progressing well with post-op mobility. Pt seen for second PT session as initial evaluation was limited. She was able to demonstrate transfers and ambulation with gross min guard assist to supervision for safety and RW for support. Pt was educated on precautions, brace application/wearing schedule, appropriate activity progression, and car transfer. Will continue to follow.   ?   ?Recommendations for follow up therapy are one component of a multi-disciplinary discharge planning process, led by the attending physician.  Recommendations may be updated based on patient status, additional functional criteria and insurance authorization. ? ?Follow Up Recommendations ? No PT follow up ?  ?  ?Assistance Recommended at Discharge    ?Patient can return home with the following A little help with walking and/or transfers;Assistance with cooking/housework;Assist for transportation;Help with stairs or ramp for entrance ?  ?Equipment Recommendations ? None recommended by PT  ?  ?Recommendations for Other Services   ? ? ?  ?Precautions / Restrictions Precautions ?Precautions: Back ?Precaution Booklet Issued: Yes (comment) ?Precaution Comments: Reviewed handout and pt was cued for precautions during functional mobility. ?Required Braces or Orthoses: Spinal Brace ?Spinal Brace: Lumbar corset ?Restrictions ?Weight Bearing Restrictions: No  ?  ? ?Mobility ? Bed Mobility ?Overal bed mobility: Needs Assistance ?Bed Mobility: Rolling, Sit to Sidelying ?Rolling: Supervision ?  ?  ?  ?Sit to sidelying: Supervision ?General bed mobility comments: HOB flat and rails lowered to simulate home environment. VC's for optimal log roll technique. ?   ? ?Transfers ?Overall transfer level: Needs assistance ?Equipment used: Rolling walker (2 wheels) ?Transfers: Sit to/from Stand ?Sit to Stand: Min guard ?  ?  ?  ?  ?  ?General transfer comment: VC's for hand placement on seated surface for safety. Hands on guarding and assist to stabilize walker as pt powered up to full stand. ?  ? ?Ambulation/Gait ?Ambulation/Gait assistance: Min guard, Supervision ?Gait Distance (Feet): 400 Feet ?Assistive device: Rolling walker (2 wheels) ?Gait Pattern/deviations: Step-through pattern, Decreased stride length ?Gait velocity: Decreased ?Gait velocity interpretation: 1.31 - 2.62 ft/sec, indicative of limited community ambulator ?  ?General Gait Details: Pt ambulating well without overt LOB. Min guard assist initially progressing to supervision for safety. Pt mildly SOB but reports this is baseline. ? ? ?Stairs ?  ?  ?  ?  ?  ? ? ?Wheelchair Mobility ?  ? ?Modified Rankin (Stroke Patients Only) ?  ? ? ?  ?Balance Overall balance assessment: Mild deficits observed, not formally tested ?  ?  ?  ?  ?  ?  ?  ?  ?  ?  ?  ?  ?  ?  ?  ?  ?  ?  ?  ? ?  ?Cognition Arousal/Alertness: Awake/alert ?Behavior During Therapy: Christy Stephenson for tasks assessed/performed ?Overall Cognitive Status: Within Functional Limits for tasks assessed ?  ?  ?  ?  ?  ?  ?  ?  ?  ?  ?  ?  ?  ?  ?  ?  ?  ?  ?  ? ?  ?Exercises   ? ?  ?General Comments   ?  ?  ? ?Pertinent Vitals/Pain Pain Assessment ?Pain Assessment: Faces ?Faces Pain Scale:  Hurts little more ?Pain Location: low back ?Pain Descriptors / Indicators: Aching ?Pain Intervention(s): Limited activity within patient's tolerance, Monitored during session, Repositioned  ? ? ?Home Living   ?  ?  ?  ?  ?  ?  ?  ?  ?  ?   ?  ?Prior Function    ?  ?  ?   ? ?PT Goals (current goals can now be found in the care plan section) Acute Rehab PT Goals ?Patient Stated Goal: Home tomorrow ?PT Goal Formulation: With patient/family ?Time For Goal Achievement: 08/15/21 ?Potential  to Achieve Goals: Good ?Progress towards PT goals: Progressing toward goals ? ?  ?Frequency ? ? ? Min 5X/week ? ? ? ?  ?PT Plan Current plan remains appropriate  ? ? ?Co-evaluation   ?  ?  ?  ?  ? ?  ?AM-PAC PT "6 Clicks" Mobility   ?Outcome Measure ? Help needed turning from your back to your side while in a flat bed without using bedrails?: A Little ?Help needed moving from lying on your back to sitting on the side of a flat bed without using bedrails?: A Little ?Help needed moving to and from a bed to a chair (including a wheelchair)?: A Little ?Help needed standing up from a chair using your arms (e.g., wheelchair or bedside chair)?: A Little ?Help needed to walk in hospital room?: A Little ?Help needed climbing 3-5 steps with a railing? : A Little ?6 Click Score: 18 ? ?  ?End of Session Equipment Utilized During Treatment: Gait belt;Back brace ?Activity Tolerance: Patient tolerated treatment well ?Patient left: in chair;with call bell/phone within reach;with family/visitor present ?Nurse Communication: Mobility status ?PT Visit Diagnosis: Unsteadiness on feet (R26.81);Pain ?Pain - part of body:  (back) ?  ? ? ?Time: 6387-5643 ?PT Time Calculation (min) (ACUTE ONLY): 17 min ? ?Charges:  $Gait Training: 8-22 mins          ?          ? ?Rolinda Roan, PT, DPT ?Acute Rehabilitation Services ?Pager: [phone] ?Office: [phone]  ? ? ?[name] ?08/08/2021, 2:52 PM ? ?

## 2021-08-08 NOTE — Evaluation (Signed)
Physical Therapy Evaluation ?Patient Details ?Name: Christy Stephenson ?MRN: [id number] ?DOB: 11-05-1940 ?Today's Date: 08/08/2021 ? ?History of Present Illness ? Pt is a 81 y/o Female who presents s/p L1-L2 TLIF on 08/07/2021.  PMH includes asthma, prior back surgery.  ?Clinical Impression ? Pt admitted with above diagnosis. At the time of PT eval, pt was able to demonstrate transfers and ambulation with gross supervision for safety and RW for support. Pt reports feeling suddenly hot and as if she was going to pass out when exiting the bathroom at beginning of session. VSS with BP 117/64 when pt back in bed. Pt currently with functional limitations due to the deficits listed below (see PT Problem List). Pt will benefit from skilled PT to increase their independence and safety with mobility to allow discharge to the venue listed below.     ?   ? ?Recommendations for follow up therapy are one component of a multi-disciplinary discharge planning process, led by the attending physician.  Recommendations may be updated based on patient status, additional functional criteria and insurance authorization. ? ?Follow Up Recommendations No PT follow up ? ?  ?Assistance Recommended at Discharge    ?Patient can return home with the following ? A little help with walking and/or transfers;Assistance with cooking/housework;Assist for transportation;Help with stairs or ramp for entrance ? ?  ?Equipment Recommendations None recommended by PT  ?Recommendations for Other Services ?    ?  ?Functional Status Assessment Patient has had a recent decline in their functional status and demonstrates the ability to make significant improvements in function in a reasonable and predictable amount of time.  ? ?  ?Precautions / Restrictions Precautions ?Precautions: Back ?Precaution Booklet Issued: Yes (comment) ?Required Braces or Orthoses: Spinal Brace ?Spinal Brace: Lumbar corset ?Restrictions ?Weight Bearing Restrictions: No  ? ?  ? ?Mobility ? Bed  Mobility ?Overal bed mobility: Needs Assistance ?Bed Mobility: Rolling, Sit to Sidelying ?Rolling: Supervision ?  ?  ?  ?Sit to sidelying: Supervision ?General bed mobility comments: HOB slightly elevated and min use of rails. Pt demonstrated fairly good log roll. ?  ? ?Transfers ?Overall transfer level: Needs assistance ?Equipment used: Rolling walker (2 wheels) ?Transfers: Sit to/from Stand ?Sit to Stand: Supervision ?  ?  ?  ?  ?  ?General transfer comment: Pt demonstrated controlled lower to EOB. VC's for positioning of hips closer to [facility] prior to initiating stand>sit. ?  ? ?Ambulation/Gait ?Ambulation/Gait assistance: Min guard, Supervision ?Gait Distance (Feet): 25 Feet ?Assistive device: Rolling walker (2 wheels) ?Gait Pattern/deviations: Step-through pattern, Decreased stride length ?Gait velocity: Decreased ?Gait velocity interpretation: <1.31 ft/sec, indicative of household ambulator ?  ?General Gait Details: Slow and guarded with RW for support. Pt rushing to get to EOB as she reports she is feeling hot, possibly like she is going to pass out. ? ?Stairs ?  ?  ?  ?  ?  ? ?Wheelchair Mobility ?  ? ?Modified Rankin (Stroke Patients Only) ?  ? ?  ? ?Balance Overall balance assessment: Mild deficits observed, not formally tested ?  ?  ?  ?  ?  ?  ?  ?  ?  ?  ?  ?  ?  ?  ?  ?  ?  ?  ?   ? ? ? ?Pertinent Vitals/Pain Pain Assessment ?Pain Assessment: Faces ?Faces Pain Scale: Hurts little more ?Pain Location: low back ?Pain Descriptors / Indicators: Aching ?Pain Intervention(s): Limited activity within patient's tolerance, Monitored during session, Repositioned  ? ? ?  Home Living Family/patient expects to be discharged to:: Private residence ?Living Arrangements: Spouse/significant other ?Available Help at Discharge: Family;Available 24 hours/day ?Type of Home: House ?Home Access: Stairs to enter ?  ?Entrance Stairs-Number of Steps: 20 from garage with stair lift ?  ?Home Layout: One level ?Home Equipment: Chartered certified accountant (2 wheels);Shower seat;BSC/3in1;Hand held shower head;Adaptive equipment ?   ?  ?Prior Function Prior Level of Function : Independent/Modified Independent ?  ?  ?  ?  ?  ?  ?  ?  ?  ? ? ?Hand Dominance  ? Dominant Hand: Right ? ?  ?Extremity/Trunk Assessment  ? Upper Extremity Assessment ?Upper Extremity Assessment: Overall WFL for tasks assessed ?  ? ?Lower Extremity Assessment ?Lower Extremity Assessment: Generalized weakness (Mild; consistent with pre-op diagnosis) ?  ? ?Cervical / Trunk Assessment ?Cervical / Trunk Assessment: Back Surgery  ?Communication  ? Communication: No difficulties  ?Cognition Arousal/Alertness: Awake/alert ?Behavior During Therapy: [facility] for tasks assessed/performed ?Overall Cognitive Status: Within Functional Limits for tasks assessed ?  ?  ?  ?  ?  ?  ?  ?  ?  ?  ?  ?  ?  ?  ?  ?  ?  ?  ?  ? ?  ?General Comments   ? ?  ?Exercises    ? ?Assessment/Plan  ?  ?PT Assessment Patient needs continued PT services  ?PT Problem List Decreased strength;Decreased activity tolerance;Decreased balance;Decreased mobility;Decreased knowledge of use of DME;Decreased safety awareness;Decreased knowledge of precautions;Pain ? ?   ?  ?PT Treatment Interventions DME instruction;Gait training;Functional mobility training;Therapeutic activities;Therapeutic exercise;Patient/family education;Balance training   ? ?PT Goals (Current goals can be found in the Care Plan section)  ?Acute Rehab PT Goals ?Patient Stated Goal: Home tomorrow ?PT Goal Formulation: With patient/family ?Time For Goal Achievement: 08/15/21 ?Potential to Achieve Goals: Good ? ?  ?Frequency Min 5X/week ?  ? ? ?Co-evaluation   ?  ?  ?  ?  ? ? ?  ?AM-PAC PT "6 Clicks" Mobility  ?Outcome Measure Help needed turning from your back to your side while in a flat bed without using bedrails?: A Little ?Help needed moving from lying on your back to sitting on the side of a flat bed without using bedrails?: A Little ?Help needed moving to and from  a bed to a chair (including a wheelchair)?: A Little ?Help needed standing up from a chair using your arms (e.g., wheelchair or bedside chair)?: A Little ?Help needed to walk in hospital room?: A Little ?Help needed climbing 3-5 steps with a railing? : A Little ?6 Click Score: 18 ? ?  ?End of Session   ?Activity Tolerance: Patient tolerated treatment well ?Patient left: in bed;with call bell/phone within reach;with family/visitor present ?Nurse Communication: Mobility status ?PT Visit Diagnosis: Unsteadiness on feet (R26.81);Pain ?Pain - part of body:  (back) ?  ? ?Time: [phone] ?PT Time Calculation (min) (ACUTE ONLY): 11 min ? ? ?Charges:   PT Evaluation ?$PT Eval Low Complexity: 1 Low ?  ?  ?   ? ? ?Rolinda Roan, PT, DPT ?Acute Rehabilitation Services ?Pager: [phone] ?Office: [phone]  ? ?[name] ?08/08/2021, 9:38 AM ? ?

## 2021-08-08 NOTE — TOC Transition Note (Signed)
Transition of Care (TOC) - CM/SW Discharge Note ? ? ?Patient Details  ?Name: Christy Stephenson ?MRN: [id number] ?Date of Birth: [birth year] ? ?Transition of Care (TOC) CM/SW Contact:  ?Carles Collet, RN ?Phone Number: ?08/08/2021, 9:42 AM ? ? ?Clinical Narrative:    ?Notified [facility] liaison of DC today. ?Unit staff to provide any DME needed ?No other TOC needs identified ? ? ? ?Final next level of care: [place] ?Barriers to Discharge: No Barriers Identified ? ? ?Patient Goals and CMS Choice ?Patient states their goals for this hospitalization and ongoing recovery are:: return home ?CMS Medicare.gov Compare Post Acute Care list provided to:: Other (Comment Required) (set up by outpatient office) ?  ? ?Discharge Placement ?  ?           ?  ?  ?  ?  ? ?Discharge Plan and Services ?  ?  ?           ?  ?  ?  ?  ?  ?HH Arranged: PT, OT ?[place] Agency: [place] ?Date HH Agency Contacted: 08/08/21 ?Time [place]: [phone] ?Representative spoke with at [place]: [place] ? ?Social Determinants of Health (SDOH) Interventions ?  ? ? ?Readmission Risk Interventions ?   ? View : No data to display.  ?  ?  ?  ? ? ? ? ? ?

## 2021-08-08 NOTE — Progress Notes (Signed)
Patient ID: Christy Stephenson, female   DOB: [birth year], 81 y.o.   MRN: [id number] ?Vital signs are stable now ?Patient notes she feels better after the surgery than she did after previous surgeries ?Her motor function appears intact ?Patient did have a vagal episode this morning on going to the restroom. ?Postoperative labs show her hemoglobin is good and her electrolytes are intact ?I believe that she needs a little bit more time to recover before discharging her home ?She will likely need home health physical therapy and Occupational Therapy ?I will place orders for these today ?

## 2021-08-09 ENCOUNTER — Encounter (HOSPITAL_COMMUNITY): Payer: Self-pay | Admitting: Neurological Surgery

## 2021-08-09 MED ORDER — ONDANSETRON HCL 4 MG PO TABS: 4.0000 mg | ORAL_TABLET | Freq: Four times a day (QID) | ORAL | 2 refills | Status: DC | PRN

## 2021-08-09 MED ORDER — OXYCODONE-ACETAMINOPHEN 5-325 MG PO TABS: 1.0000 | ORAL_TABLET | ORAL | 0 refills | Status: DC | PRN

## 2021-08-09 MED ORDER — METHOCARBAMOL 500 MG PO TABS: 500.0000 mg | ORAL_TABLET | Freq: Four times a day (QID) | ORAL | 2 refills | Status: DC | PRN

## 2021-08-09 MED ORDER — DEXAMETHASONE 1 MG PO TABS: ORAL_TABLET | ORAL | 0 refills | Status: DC

## 2021-08-09 NOTE — Discharge Instructions (Signed)
Wound Care ?REMOVE DRESSING IN 3 DAYS ?Leave incision open to air. ?IF SATURATED, CHANGE DRESSING AND INFORM DOCTOR ?You may shower. ?Do not scrub directly on incision.  ?Do not put any creams, lotions, or ointments on incision. ?Activity ?Walk each and every day, increasing distance each day. ?No lifting greater than 5 lbs.  Avoid bending, arching, and twisting. ?No driving for 2 weeks; may ride as a passenger locally. ?If provided with back brace, wear when out of bed.  It is not necessary to wear in bed. ?Diet ?Resume your normal diet.  ? ?Call Your Doctor If Any of These Occur ?Redness, drainage, or swelling at the wound.  ?Temperature greater than 101 degrees. ?Severe pain not relieved by pain medication. ?Incision starts to come apart. ?Follow Up Appt ?Call today for appointment in 3 weeks ([phone]) or for problems.  If you have any hardware placed in your spine, you will need an x-ray before your appointment.  ?

## 2021-08-09 NOTE — Progress Notes (Signed)
Patient alert and oriented, voiding adequately, skin clean, dry and intact without evidence of skin break down, or symptoms of complications - no redness or edema noted, only slight tenderness at site.  Staff educated patient and husband on dressing change. Dressing was also changed prior to discharge with husband watching, and patient stated her friend will help with dressing change at home. Extra dressing supplies given to patient for home use. Patient states pain is manageable at time of discharge. Patient has an appointment with MD in 3 weeks ?

## 2021-08-09 NOTE — Progress Notes (Signed)
Physical Therapy Treatment and Discharge ? ?Patient Details ?Name: Christy Stephenson ?MRN: [id number] ?DOB: 01/29/1941 ?Today's Date: 08/09/2021 ? ? ?History of Present Illness Pt is a 81 y/o Female who presents s/p L1-L2 TLIF on 08/07/2021.  PMH includes asthma, prior back surgery. ? ?  ?PT Comments  ? ? Pt progressing well with post-op mobility. She was able to demonstrate transfers and ambulation with gross modified independence and RW for support. Reinforced education on precautions, brace application/wearing schedule, appropriate activity progression, and car transfer. Pt has met all acute PT goals at this time and we will sign off. Pt anticipates d/c home today. If needs change, please reconsult.  ?   ?Recommendations for follow up therapy are one component of a multi-disciplinary discharge planning process, led by the attending physician.  Recommendations may be updated based on patient status, additional functional criteria and insurance authorization. ? ?Follow Up Recommendations ? No PT follow up ?  ?  ?Assistance Recommended at Discharge    ?Patient can return home with the following A little help with walking and/or transfers;Assistance with cooking/housework;Assist for transportation;Help with stairs or ramp for entrance ?  ?Equipment Recommendations ? None recommended by PT  ?  ?Recommendations for Other Services   ? ? ?  ?Precautions / Restrictions Precautions ?Precautions: Back ?Precaution Booklet Issued: Yes (comment) ?Precaution Comments: Reviewed handout and pt was cued for precautions during functional mobility. ?Required Braces or Orthoses: Spinal Brace ?Spinal Brace: Lumbar corset ?Restrictions ?Weight Bearing Restrictions: No  ?  ? ?Mobility ? Bed Mobility ?Overal bed mobility: Modified Independent ?Bed Mobility: Rolling, Sidelying to Sit ?  ?  ?  ?  ?  ?General bed mobility comments: HOB minimally elevated. No assist required for transition to EOB. Min cues for optimal log roll technique. ?   ? ?Transfers ?Overall transfer level: Modified independent ?Equipment used: Rolling walker (2 wheels) ?Transfers: Sit to/from Stand ?  ?  ?  ?  ?  ?  ?General transfer comment: Increased time to initiate movement and achieve full stand due to pain, but overall no assist required to power up to full stand. ?  ? ?Ambulation/Gait ?Ambulation/Gait assistance: Modified independent (Device/Increase time) ?Gait Distance (Feet): 400 Feet ?Assistive device: Rolling walker (2 wheels) ?Gait Pattern/deviations: Step-through pattern, Decreased stride length ?Gait velocity: Decreased ?Gait velocity interpretation: 1.31 - 2.62 ft/sec, indicative of limited community ambulator ?  ?General Gait Details: Pt ambulating well without overt LOB. Pt maintaining good posture throughout gait training. ? ? ?Stairs ?  ?  ?  ?  ?  ? ? ?Wheelchair Mobility ?  ? ?Modified Rankin (Stroke Patients Only) ?  ? ? ?  ?Balance Overall balance assessment: Mild deficits observed, not formally tested ?  ?  ?  ?  ?  ?  ?  ?  ?  ?  ?  ?  ?  ?  ?  ?  ?  ?  ?  ? ?  ?Cognition Arousal/Alertness: Awake/alert ?Behavior During Therapy: Christy Stephenson for tasks assessed/performed ?Overall Cognitive Status: Within Functional Limits for tasks assessed ?  ?  ?  ?  ?  ?  ?  ?  ?  ?  ?  ?  ?  ?  ?  ?  ?  ?  ?  ? ?  ?Exercises   ? ?  ?General Comments   ?  ?  ? ?Pertinent Vitals/Pain Pain Assessment ?Pain Assessment: Faces ?Faces Pain Scale: Hurts a little bit ?Pain Location: low back ?  Pain Descriptors / Indicators: Aching ?Pain Intervention(s): Limited activity within patient's tolerance, Monitored during session, Repositioned  ? ? ?Home Living   ?  ?  ?  ?  ?  ?  ?  ?  ?  ?   ?  ?Prior Function    ?  ?  ?   ? ?PT Goals (current goals can now be found in the care plan section) Acute Rehab PT Goals ?Patient Stated Goal: Home today ?PT Goal Formulation: With patient/family ?Time For Goal Achievement: 08/15/21 ?Potential to Achieve Goals: Good ?Progress towards PT goals: Goals  met/education completed, patient discharged from PT ? ?  ?Frequency ? ? ? Min 5X/week ? ? ? ?  ?PT Plan Current plan remains appropriate  ? ? ?Co-evaluation   ?  ?  ?  ?  ? ?  ?AM-PAC PT "6 Clicks" Mobility   ?Outcome Measure ? Help needed turning from your back to your side while in a flat bed without using bedrails?: None ?Help needed moving from lying on your back to sitting on the side of a flat bed without using bedrails?: None ?Help needed moving to and from a bed to a chair (including a wheelchair)?: None ?Help needed standing up from a chair using your arms (e.g., wheelchair or bedside chair)?: None ?Help needed to walk in hospital room?: None ?Help needed climbing 3-5 steps with a railing? : A Little ?6 Click Score: 23 ? ?  ?End of Session Equipment Utilized During Treatment: Back brace ?Activity Tolerance: Patient tolerated treatment well ?Patient left: Other (comment) (In bathroom with husband present in room and call light cord in reach.) ?Nurse Communication: Mobility status (Pt in bathroom) ?PT Visit Diagnosis: Unsteadiness on feet (R26.81);Pain ?Pain - part of body:  (back) ?  ? ? ?Time: 1610-9604 ?PT Time Calculation (min) (ACUTE ONLY): 11 min ? ?Charges:  $Gait Training: 8-22 mins          ?          ? ?Rolinda Roan, PT, DPT ?Acute Rehabilitation Services ?Pager: [phone] ?Office: [phone]  ? ? ?[name] ?08/09/2021, 11:13 AM ? ?

## 2021-08-10 MED FILL — Heparin Sodium (Porcine) Inj 1000 Unit/ML: INTRAMUSCULAR | Qty: 30 | Status: AC

## 2021-08-10 MED FILL — Sodium Chloride IV Soln 0.9%: INTRAVENOUS | Qty: 1000 | Status: AC

## 2021-08-10 MED FILL — Sodium Chloride Irrigation Soln 0.9%: Qty: 3000 | Status: AC

## 2021-08-21 ENCOUNTER — Inpatient Hospital Stay (HOSPITAL_COMMUNITY): Payer: Medicare Other

## 2021-08-21 ENCOUNTER — Inpatient Hospital Stay (HOSPITAL_COMMUNITY)
Admission: EM | Admit: 2021-08-21 | Discharge: 2021-08-27 | DRG: 871 | Disposition: A | Discharge status: Home Health | Payer: Medicare Other | Source: Other Acute Inpatient Hospital | Attending: Internal Medicine | Admitting: Internal Medicine

## 2021-08-21 DIAGNOSIS — I872 Venous insufficiency (chronic) (peripheral): Secondary | ICD-10-CM | POA: Diagnosis not present

## 2021-08-21 DIAGNOSIS — Y793 Surgical instruments, materials and orthopedic devices (including sutures) associated with adverse incidents: Secondary | ICD-10-CM | POA: Diagnosis present

## 2021-08-21 DIAGNOSIS — Z79899 Other long term (current) drug therapy: Secondary | ICD-10-CM

## 2021-08-21 DIAGNOSIS — T8131XA Disruption of external operation (surgical) wound, not elsewhere classified, initial encounter: Secondary | ICD-10-CM | POA: Diagnosis present

## 2021-08-21 DIAGNOSIS — T8144XA Sepsis following a procedure, initial encounter: Secondary | ICD-10-CM | POA: Diagnosis not present

## 2021-08-21 DIAGNOSIS — R0602 Shortness of breath: Secondary | ICD-10-CM | POA: Diagnosis not present

## 2021-08-21 DIAGNOSIS — S2242XA Multiple fractures of ribs, left side, initial encounter for closed fracture: Secondary | ICD-10-CM | POA: Diagnosis not present

## 2021-08-21 DIAGNOSIS — N179 Acute kidney failure, unspecified: Secondary | ICD-10-CM | POA: Diagnosis present

## 2021-08-21 DIAGNOSIS — Z888 Allergy status to other drugs, medicaments and biological substances status: Secondary | ICD-10-CM

## 2021-08-21 DIAGNOSIS — J8410 Pulmonary fibrosis, unspecified: Secondary | ICD-10-CM | POA: Diagnosis present

## 2021-08-21 DIAGNOSIS — I5041 Acute combined systolic (congestive) and diastolic (congestive) heart failure: Secondary | ICD-10-CM | POA: Diagnosis not present

## 2021-08-21 DIAGNOSIS — E861 Hypovolemia: Secondary | ICD-10-CM | POA: Diagnosis present

## 2021-08-21 DIAGNOSIS — G629 Polyneuropathy, unspecified: Secondary | ICD-10-CM | POA: Diagnosis present

## 2021-08-21 DIAGNOSIS — Z833 Family history of diabetes mellitus: Secondary | ICD-10-CM | POA: Diagnosis not present

## 2021-08-21 DIAGNOSIS — R000 Tachycardia, unspecified: Secondary | ICD-10-CM | POA: Diagnosis not present

## 2021-08-21 DIAGNOSIS — R110 Nausea: Secondary | ICD-10-CM | POA: Diagnosis not present

## 2021-08-21 DIAGNOSIS — R112 Nausea with vomiting, unspecified: Secondary | ICD-10-CM | POA: Diagnosis not present

## 2021-08-21 DIAGNOSIS — T8579XA Infection and inflammatory reaction due to other internal prosthetic devices, implants and grafts, initial encounter: Secondary | ICD-10-CM | POA: Diagnosis present

## 2021-08-21 DIAGNOSIS — Y838 Other surgical procedures as the cause of abnormal reaction of the patient, or of later complication, without mention of misadventure at the time of the procedure: Secondary | ICD-10-CM | POA: Diagnosis present

## 2021-08-21 DIAGNOSIS — Z452 Encounter for adjustment and management of vascular access device: Secondary | ICD-10-CM | POA: Diagnosis not present

## 2021-08-21 DIAGNOSIS — Z801 Family history of malignant neoplasm of trachea, bronchus and lung: Secondary | ICD-10-CM | POA: Diagnosis not present

## 2021-08-21 DIAGNOSIS — I10 Essential (primary) hypertension: Secondary | ICD-10-CM | POA: Diagnosis not present

## 2021-08-21 DIAGNOSIS — R579 Shock, unspecified: Principal | ICD-10-CM

## 2021-08-21 DIAGNOSIS — Z6841 Body Mass Index (BMI) 40.0 and over, adult: Secondary | ICD-10-CM

## 2021-08-21 DIAGNOSIS — J439 Emphysema, unspecified: Secondary | ICD-10-CM | POA: Diagnosis present

## 2021-08-21 DIAGNOSIS — T81718A Complication of other artery following a procedure, not elsewhere classified, initial encounter: Secondary | ICD-10-CM | POA: Diagnosis present

## 2021-08-21 DIAGNOSIS — Z85828 Personal history of other malignant neoplasm of skin: Secondary | ICD-10-CM

## 2021-08-21 DIAGNOSIS — Z7951 Long term (current) use of inhaled steroids: Secondary | ICD-10-CM

## 2021-08-21 DIAGNOSIS — T8189XA Other complications of procedures, not elsewhere classified, initial encounter: Secondary | ICD-10-CM | POA: Diagnosis not present

## 2021-08-21 DIAGNOSIS — I248 Other forms of acute ischemic heart disease: Secondary | ICD-10-CM | POA: Diagnosis present

## 2021-08-21 DIAGNOSIS — I2699 Other pulmonary embolism without acute cor pulmonale: Secondary | ICD-10-CM | POA: Diagnosis not present

## 2021-08-21 DIAGNOSIS — Z96651 Presence of right artificial knee joint: Secondary | ICD-10-CM | POA: Diagnosis present

## 2021-08-21 DIAGNOSIS — K838 Other specified diseases of biliary tract: Secondary | ICD-10-CM | POA: Diagnosis not present

## 2021-08-21 DIAGNOSIS — R197 Diarrhea, unspecified: Secondary | ICD-10-CM | POA: Diagnosis not present

## 2021-08-21 DIAGNOSIS — Z20828 Contact with and (suspected) exposure to other viral communicable diseases: Secondary | ICD-10-CM | POA: Diagnosis not present

## 2021-08-21 DIAGNOSIS — K219 Gastro-esophageal reflux disease without esophagitis: Secondary | ICD-10-CM | POA: Diagnosis present

## 2021-08-21 DIAGNOSIS — R1011 Right upper quadrant pain: Secondary | ICD-10-CM | POA: Diagnosis not present

## 2021-08-21 DIAGNOSIS — E8721 Acute metabolic acidosis: Secondary | ICD-10-CM | POA: Diagnosis present

## 2021-08-21 DIAGNOSIS — Z8673 Personal history of transient ischemic attack (TIA), and cerebral infarction without residual deficits: Secondary | ICD-10-CM | POA: Diagnosis not present

## 2021-08-21 DIAGNOSIS — A4151 Sepsis due to Escherichia coli [E. coli]: Principal | ICD-10-CM | POA: Diagnosis present

## 2021-08-21 DIAGNOSIS — R0689 Other abnormalities of breathing: Secondary | ICD-10-CM | POA: Diagnosis not present

## 2021-08-21 DIAGNOSIS — D6959 Other secondary thrombocytopenia: Secondary | ICD-10-CM | POA: Diagnosis present

## 2021-08-21 DIAGNOSIS — R7881 Bacteremia: Secondary | ICD-10-CM | POA: Diagnosis not present

## 2021-08-21 DIAGNOSIS — J9601 Acute respiratory failure with hypoxia: Secondary | ICD-10-CM | POA: Diagnosis present

## 2021-08-21 DIAGNOSIS — A419 Sepsis, unspecified organism: Secondary | ICD-10-CM | POA: Diagnosis not present

## 2021-08-21 DIAGNOSIS — R069 Unspecified abnormalities of breathing: Secondary | ICD-10-CM | POA: Diagnosis not present

## 2021-08-21 DIAGNOSIS — B9620 Unspecified Escherichia coli [E. coli] as the cause of diseases classified elsewhere: Secondary | ICD-10-CM | POA: Diagnosis not present

## 2021-08-21 DIAGNOSIS — I5020 Unspecified systolic (congestive) heart failure: Secondary | ICD-10-CM | POA: Diagnosis not present

## 2021-08-21 DIAGNOSIS — R609 Edema, unspecified: Secondary | ICD-10-CM | POA: Diagnosis not present

## 2021-08-21 DIAGNOSIS — R531 Weakness: Secondary | ICD-10-CM | POA: Diagnosis not present

## 2021-08-21 DIAGNOSIS — D62 Acute posthemorrhagic anemia: Secondary | ICD-10-CM | POA: Diagnosis present

## 2021-08-21 DIAGNOSIS — R6521 Severe sepsis with septic shock: Secondary | ICD-10-CM

## 2021-08-21 DIAGNOSIS — G603 Idiopathic progressive neuropathy: Secondary | ICD-10-CM | POA: Diagnosis present

## 2021-08-21 DIAGNOSIS — Z20822 Contact with and (suspected) exposure to covid-19: Secondary | ICD-10-CM | POA: Diagnosis not present

## 2021-08-21 DIAGNOSIS — R7989 Other specified abnormal findings of blood chemistry: Secondary | ICD-10-CM | POA: Diagnosis not present

## 2021-08-21 DIAGNOSIS — T8141XA Infection following a procedure, superficial incisional surgical site, initial encounter: Secondary | ICD-10-CM | POA: Diagnosis present

## 2021-08-21 DIAGNOSIS — Z9049 Acquired absence of other specified parts of digestive tract: Secondary | ICD-10-CM | POA: Diagnosis not present

## 2021-08-21 DIAGNOSIS — I959 Hypotension, unspecified: Secondary | ICD-10-CM | POA: Diagnosis not present

## 2021-08-21 DIAGNOSIS — Z882 Allergy status to sulfonamides status: Secondary | ICD-10-CM

## 2021-08-21 DIAGNOSIS — Z981 Arthrodesis status: Secondary | ICD-10-CM | POA: Diagnosis not present

## 2021-08-21 DIAGNOSIS — Z7982 Long term (current) use of aspirin: Secondary | ICD-10-CM | POA: Diagnosis not present

## 2021-08-21 DIAGNOSIS — E875 Hyperkalemia: Secondary | ICD-10-CM | POA: Diagnosis present

## 2021-08-21 DIAGNOSIS — R509 Fever, unspecified: Secondary | ICD-10-CM | POA: Diagnosis not present

## 2021-08-21 DIAGNOSIS — E871 Hypo-osmolality and hyponatremia: Secondary | ICD-10-CM | POA: Diagnosis present

## 2021-08-21 DIAGNOSIS — R911 Solitary pulmonary nodule: Secondary | ICD-10-CM | POA: Diagnosis not present

## 2021-08-21 DIAGNOSIS — G4733 Obstructive sleep apnea (adult) (pediatric): Secondary | ICD-10-CM | POA: Diagnosis present

## 2021-08-21 DIAGNOSIS — E038 Other specified hypothyroidism: Secondary | ICD-10-CM | POA: Diagnosis present

## 2021-08-21 DIAGNOSIS — Z8249 Family history of ischemic heart disease and other diseases of the circulatory system: Secondary | ICD-10-CM

## 2021-08-21 DIAGNOSIS — Z831 Family history of other infectious and parasitic diseases: Secondary | ICD-10-CM

## 2021-08-21 DIAGNOSIS — J449 Chronic obstructive pulmonary disease, unspecified: Secondary | ICD-10-CM | POA: Diagnosis not present

## 2021-08-21 DIAGNOSIS — Z823 Family history of stroke: Secondary | ICD-10-CM

## 2021-08-21 DIAGNOSIS — E7800 Pure hypercholesterolemia, unspecified: Secondary | ICD-10-CM | POA: Diagnosis present

## 2021-08-21 LAB — TROPONIN I (HIGH SENSITIVITY)
Troponin I (High Sensitivity): 1661 ng/L (ref <18)
Troponin I (High Sensitivity): 3397 ng/L (ref <18)
Troponin I (High Sensitivity): 3185 ng/L (ref <18)

## 2021-08-21 LAB — BASIC METABOLIC PANEL
Anion gap: 15 (ref 5–15)
Anion gap: 12 (ref 5–15)
BUN: 46 mg/dL — ABNORMAL HIGH (ref 8–23)
BUN: 44 mg/dL — ABNORMAL HIGH (ref 8–23)
CO2: 12 mmol/L — ABNORMAL LOW (ref 22–32)
CO2: 17 mmol/L — ABNORMAL LOW (ref 22–32)
Calcium: 7.6 mg/dL — ABNORMAL LOW (ref 8.9–10.3)
Calcium: 7.6 mg/dL — ABNORMAL LOW (ref 8.9–10.3)
Chloride: 101 mmol/L (ref 98–111)
Chloride: 102 mmol/L (ref 98–111)
Creatinine, Ser: 2.78 mg/dL — ABNORMAL HIGH (ref 0.44–1.00)
Creatinine, Ser: 2.36 mg/dL — ABNORMAL HIGH (ref 0.44–1.00)
GFR, Estimated: 17 mL/min — ABNORMAL LOW (ref >60)
GFR, Estimated: 20 mL/min — ABNORMAL LOW (ref >60)
Glucose, Bld: 91 mg/dL (ref 70–99)
Glucose, Bld: 89 mg/dL (ref 70–99)
Potassium: 5.2 mmol/L — ABNORMAL HIGH (ref 3.5–5.1)
Potassium: 4.7 mmol/L (ref 3.5–5.1)
Sodium: 128 mmol/L — ABNORMAL LOW (ref 135–145)
Sodium: 131 mmol/L — ABNORMAL LOW (ref 135–145)

## 2021-08-21 LAB — POCT I-STAT 7, (LYTES, BLD GAS, ICA,H+H)
Acid-base deficit: 5 mmol/L — ABNORMAL HIGH (ref 0.0–2.0)
Bicarbonate: 17.3 mmol/L — ABNORMAL LOW (ref 20.0–28.0)
Calcium, Ion: 1.05 mmol/L — ABNORMAL LOW (ref 1.15–1.40)
HCT: 26 % — ABNORMAL LOW (ref 36.0–46.0)
Hemoglobin: 8.8 g/dL — ABNORMAL LOW (ref 12.0–15.0)
O2 Saturation: 97 %
Patient temperature: 98.7
Potassium: 3.7 mmol/L (ref 3.5–5.1)
Sodium: 133 mmol/L — ABNORMAL LOW (ref 135–145)
TCO2: 18 mmol/L — ABNORMAL LOW (ref 22–32)
pCO2 arterial: 24.1 mmHg — ABNORMAL LOW (ref 32–48)
pH, Arterial: 7.464 — ABNORMAL HIGH (ref 7.35–7.45)
pO2, Arterial: 82 mmHg — ABNORMAL LOW (ref 83–108)

## 2021-08-21 LAB — MRSA NEXT GEN BY PCR, NASAL: MRSA by PCR Next Gen: NOT DETECTED

## 2021-08-21 LAB — HEPARIN LEVEL (UNFRACTIONATED): Heparin Unfractionated: 0.8 IU/mL — ABNORMAL HIGH (ref 0.30–0.70)

## 2021-08-21 LAB — OSMOLALITY, URINE: Osmolality, Ur: 424 mOsm/kg (ref 300–900)

## 2021-08-21 LAB — T4, FREE: Free T4: 1.08 ng/dL (ref 0.61–1.12)

## 2021-08-21 LAB — SODIUM, URINE, RANDOM: Sodium, Ur: <10 mmol/L

## 2021-08-21 LAB — TSH: TSH: 0.298 u[IU]/mL — ABNORMAL LOW (ref 0.350–4.500)

## 2021-08-21 LAB — ECHOCARDIOGRAM COMPLETE
Area-P 1/2: 4.22 cm2
Calc EF: 27.6 %
Height: 64 in
S' Lateral: 3.3 cm
Single Plane A2C EF: 28.9 %
Single Plane A4C EF: 27.4 %
Weight: 3806.02 oz

## 2021-08-21 LAB — SODIUM: Sodium: 130 mmol/L — ABNORMAL LOW (ref 135–145)

## 2021-08-21 LAB — LACTIC ACID, PLASMA
Lactic Acid, Venous: 3.9 mmol/L (ref 0.5–1.9)
Lactic Acid, Venous: 3 mmol/L (ref 0.5–1.9)

## 2021-08-21 LAB — GLUCOSE, CAPILLARY: Glucose-Capillary: 98 mg/dL (ref 70–99)

## 2021-08-21 LAB — BRAIN NATRIURETIC PEPTIDE: B Natriuretic Peptide: 1198.5 pg/mL — ABNORMAL HIGH (ref 0.0–100.0)

## 2021-08-21 LAB — SEDIMENTATION RATE: Sed Rate: 110 mm/hr — ABNORMAL HIGH (ref 0–22)

## 2021-08-21 LAB — PROCALCITONIN: Procalcitonin: 15.03 ng/mL

## 2021-08-21 LAB — C-REACTIVE PROTEIN: CRP: 44.3 mg/dL — ABNORMAL HIGH (ref <1.0)

## 2021-08-21 LAB — CORTISOL: Cortisol, Plasma: 31.2 ug/dL

## 2021-08-21 LAB — OSMOLALITY: Osmolality: 284 mOsm/kg (ref 275–295)

## 2021-08-21 MED ORDER — HEPARIN (PORCINE) 25000 UT/250ML-% IV SOLN
1450.0000 [IU]/h | INTRAVENOUS | Status: AC
  Administered 2021-08-21: 1400 [IU]/h via INTRAVENOUS
  Administered 2021-08-21: 1250 [IU]/h via INTRAVENOUS
  Administered 2021-08-22: 1350 [IU]/h via INTRAVENOUS
  Filled 2021-08-21 (×3): qty 250

## 2021-08-21 MED ORDER — ALBUTEROL SULFATE (2.5 MG/3ML) 0.083% IN NEBU
2.5000 mg | INHALATION_SOLUTION | RESPIRATORY_TRACT | Status: DC | PRN
  Administered 2021-08-21 (×2): 2.5 mg via RESPIRATORY_TRACT
  Filled 2021-08-21 (×3): qty 3

## 2021-08-21 MED ORDER — SODIUM BICARBONATE 8.4 % IV SOLN: 100.0000 meq | Freq: Once | INTRAVENOUS | Status: AC

## 2021-08-21 MED ORDER — ONDANSETRON HCL 4 MG/2ML IJ SOLN
4.0000 mg | Freq: Four times a day (QID) | INTRAMUSCULAR | Status: DC | PRN
Start: 2021-08-21 — End: 2021-08-27
  Filled 2021-08-21: qty 2

## 2021-08-21 MED ORDER — TECHNETIUM TO 99M ALBUMIN AGGREGATED
4.2000 | Freq: Once | INTRAVENOUS | Status: AC | PRN
  Administered 2021-08-21: 4.2 via INTRAVENOUS

## 2021-08-21 MED ORDER — PERFLUTREN LIPID MICROSPHERE
1.0000 mL | INTRAVENOUS | Status: AC | PRN
  Administered 2021-08-21: 2 mL via INTRAVENOUS
  Filled 2021-08-21: qty 10

## 2021-08-21 MED ORDER — CALCIUM GLUCONATE-NACL 1-0.675 GM/50ML-% IV SOLN
1.0000 g | Freq: Once | INTRAVENOUS | Status: AC
  Administered 2021-08-21: 1000 mg via INTRAVENOUS
  Filled 2021-08-21: qty 50

## 2021-08-21 MED ORDER — SODIUM CHLORIDE 0.9 % IV BOLUS
1000.0000 mL | Freq: Once | INTRAVENOUS | Status: AC
  Administered 2021-08-21: 1000 mL via INTRAVENOUS

## 2021-08-21 MED ORDER — FLUTICASONE FUROATE-VILANTEROL 100-25 MCG/ACT IN AEPB
1.0000 | INHALATION_SPRAY | Freq: Every day | RESPIRATORY_TRACT | Status: DC
Start: 2021-08-21 — End: 2021-08-27
  Administered 2021-08-21 – 2021-08-27 (×6): 1 via RESPIRATORY_TRACT
  Filled 2021-08-21: qty 28

## 2021-08-21 MED ORDER — SODIUM ZIRCONIUM CYCLOSILICATE 10 G PO PACK
10.0000 g | PACK | Freq: Once | ORAL | Status: AC
  Administered 2021-08-21: 10 g via ORAL
  Filled 2021-08-21: qty 1

## 2021-08-21 MED ORDER — DOCUSATE SODIUM 100 MG PO CAPS
100.0000 mg | ORAL_CAPSULE | Freq: Two times a day (BID) | ORAL | Status: DC | PRN
Start: 2021-08-21 — End: 2021-08-27

## 2021-08-21 MED ORDER — UMECLIDINIUM BROMIDE 62.5 MCG/ACT IN AEPB
1.0000 | INHALATION_SPRAY | Freq: Every day | RESPIRATORY_TRACT | Status: DC
  Administered 2021-08-21 – 2021-08-27 (×6): 1 via RESPIRATORY_TRACT
  Filled 2021-08-21: qty 7

## 2021-08-21 MED ORDER — NOREPINEPHRINE 4 MG/250ML-% IV SOLN
2.0000 ug/min | INTRAVENOUS | Status: DC
  Administered 2021-08-21: 8 ug/min via INTRAVENOUS
  Filled 2021-08-21: qty 250

## 2021-08-21 MED ORDER — ASPIRIN 81 MG PO CHEW
81.0000 mg | CHEWABLE_TABLET | Freq: Every day | ORAL | Status: DC
  Administered 2021-08-21 – 2021-08-27 (×7): 81 mg via ORAL
  Filled 2021-08-21 (×7): qty 1

## 2021-08-21 MED ORDER — ONDANSETRON HCL 4 MG/2ML IJ SOLN
4.0000 mg | Freq: Once | INTRAMUSCULAR | Status: AC
  Administered 2021-08-21: 4 mg via INTRAVENOUS

## 2021-08-21 MED ORDER — POLYETHYLENE GLYCOL 3350 17 G PO PACK: 17.0000 g | PACK | Freq: Every day | ORAL | Status: DC | PRN

## 2021-08-21 MED ORDER — SODIUM CHLORIDE 0.9 % IV SOLN: 250.0000 mL | INTRAVENOUS | Status: DC

## 2021-08-21 MED ORDER — NOREPINEPHRINE 16 MG/250ML-% IV SOLN
0.0000 ug/min | INTRAVENOUS | Status: DC
  Administered 2021-08-21: 8 ug/min via INTRAVENOUS
  Filled 2021-08-21: qty 250

## 2021-08-21 MED ORDER — CHLORHEXIDINE GLUCONATE CLOTH 2 % EX PADS
6.0000 | MEDICATED_PAD | Freq: Every day | CUTANEOUS | Status: DC
  Administered 2021-08-21 (×2): 6 via TOPICAL

## 2021-08-21 MED ORDER — STERILE WATER FOR INJECTION IV SOLN
INTRAVENOUS | Status: DC
  Filled 2021-08-21 (×2): qty 1000
  Filled 2021-08-21: qty 150
  Filled 2021-08-21: qty 1000

## 2021-08-21 MED ORDER — SODIUM BICARBONATE 8.4 % IV SOLN
INTRAVENOUS | Status: AC
  Administered 2021-08-21: 100 meq via INTRAVENOUS
  Filled 2021-08-21: qty 100

## 2021-08-21 MED ORDER — SODIUM CHLORIDE 0.9 % IV SOLN
2.0000 g | INTRAVENOUS | Status: DC
  Administered 2021-08-22 – 2021-08-23 (×2): 2 g via INTRAVENOUS
  Filled 2021-08-21 (×2): qty 12.5

## 2021-08-21 MED ORDER — PROMETHAZINE HCL 25 MG PO TABS: 12.5000 mg | ORAL_TABLET | Freq: Four times a day (QID) | ORAL | Status: DC | PRN

## 2021-08-21 MED ORDER — SODIUM CHLORIDE 0.9 % IV SOLN: INTRAVENOUS | Status: DC

## 2021-08-21 NOTE — Progress Notes (Addendum)
eLink Physician-Brief Progress Note ?Patient Name: Christy Stephenson ?DOB: [birth year] ?MRN: [id number] ? ? ?Date of Service ? 08/21/2021  ?HPI/Events of Note ? Elevated Troponin = 1661 (12:46 PM) --> 3397 (3:51 PM) --> 3185 (5:19 PM). EKG at 2:25 PM reveals sinus rhythm and low voltage QRS. Cardiac echo today reveals Left  ventricular ejection fraction by 3D volume is 35 %. The left ventricle has moderately decreased function. The left ventricle demonstrates global hypokinesis. Right ventricular systolic function is mildly reduced. The right  ventricular size is mildly enlarged. There is moderately elevated pulmonary artery systolic pressure. Clinical picture c/w demand ischemia vs NSTEMI vs Sepsis. Appears to be on a Heparin IV infusion already. Patient is currently on a Norepinephrine IV infusion. Therefore, can not B-Block.  ?eICU Interventions ? Plan: ?Add ASA 81 mg PO now and Q day.   ? ? ? ?Intervention Category ?Major Interventions: Other: ? ?[place] Greb Cornelia Copa ?08/21/2021, 7:45 PM ?

## 2021-08-21 NOTE — Progress Notes (Addendum)
Pharmacy Antibiotic/Anticoagulation  Note ? ?Christy Stephenson is a 81 y.o. female admitted on 08/21/2021 with sepsis.   ? ?ID: Pharmacy has been consulted for Vancomycin and Cefepime dosing. S/p spinal surgery 08/07/21. ? ?Pt came from [facility] - Cefepime and Vancomycin not charted but per verbal report RN at OSH stated they gave Cefepime 2gm ~0300 and Vancomycin 2gm was hangin when pt arrived here. ? ?Scr up to 2.6 at [facility] (BL ~1), est CrCl 27m/min ? ?AC: Pt also started on heparin for suspected PE at OSH. Heparin 5000 units bolus and gtt started at 1800 units - began ~0300. Heparin dosing wt 80 kg. No AC PTA. ? ?Heparin level came back at 0.8. We will decrease rate and check level in AM.  ? ?Plan: ?Decrease heparin to 1250 units.hr ?Will f/u heparin level in AM ?Daily heparin level and CBC ?Cefepime 2gm IV q24h ?F/u SCr for further vanc dosing. Consider random level ~48 hr post dose at OSH. ?Will f/u micro data and pt's clinical condition  ?Vanc levels prn ?F/u cx from OSH ? ? ?Height: '5\' 4"'$  (162.6 cm) ?Weight: 107.9 kg (237 lb 14 oz) ?IBW/kg (Calculated) : 54.7 ? ?Temp (24hrs), Avg:97.9 ?F (36.6 ?C), Min:97.7 ?F (36.5 ?C), Max:98.3 ?F (36.8 ?C) ? ?Recent Labs  ?Lab 08/21/21 ?1246  ?CREATININE 2.78*  ?LATICACIDVEN 3.9*  ?  ?Estimated Creatinine Clearance: 19.7 mL/min (A) (by C-G formula based on SCr of 2.78 mg/dL (H)).   ? ?Allergies  ?Allergen Reactions  ? Sulfa Antibiotics Other (See Comments) and Palpitations  ? Actifed [Triprolidine-Pse] Palpitations  ? Lipitor [Atorvastatin] Other (See Comments)  ?  Aches and pains  ? Bupropion Hcl Itching and Rash  ? ? ?Antimicrobials this admission: ?4/4 Vanc >>  ?4/4 Cefepime >>  ? ?Microbiology results: ?4/4 BCx ([facility] ?4/4 MRSA PCR:  ? ?MOnnie Boer PharmD, BCIDP, AAHIVP, CPP ?Infectious Disease Pharmacist ?08/21/2021 4:47 PM ? ? ?

## 2021-08-21 NOTE — H&P (Signed)
? ?NAME:  Christy Stephenson, MRN:  [id number], DOB:  12-20-1940, LOS: 0 ?ADMISSION DATE:  08/21/2021, CONSULTATION DATE:  08/21/21 ?REFERRING MD:  [facility] CHIEF COMPLAINT:  Weakness  ? ?History of Present Illness:  ?Christy Stephenson is a 81 y.o. female who has a PMH as below.  She had recent admission to our hospital 08/07/21 for decompression of L1-L2 with interbody spacer, pedicle fixation L1-S2, and arthrodesis L4 to sacrum. ? ?She did well post op up until around 4/2 when she began to have generalized weakness and fever.  Symptoms progressed and 4/4, she started having dyspnea, N/V/D as well as increased drainage from incision site, non-purulent. ? ?In ED, DDimer elevated [place] [address], lactate 4.9, SCr 2.8, WBC 5.6. She was also hypotensive to the 60s and hypoxic requiring [place].  She was placed on o2 and was started on Norepinephrine. ? ?She was unable to get CTA chest given AKI; however, there was strong concern for PE.  She was started on Heparin and was transferred to the ICU at [facility] for ongoing management. ? ? ?Pertinent  Medical History:  ?has IDIOPATHIC PROGRESSIVE POLYNEUROPATHY; Asthma with bronchitis; Obstructive sleep apnea; MEMORY LOSS; OTHER CHEST PAIN; ELECTROCARDIOGRAM, ABNORMAL; Dyspnea; Abnormal finding on cardiovascular stress test; Transient global amnesia; Granulomatous lung disease (HCC); OA (osteoarthritis) of knee; Primary osteoarthritis of right knee; and Pseudoarthrosis of lumbar spine on their problem list. ? ?Significant Hospital Events: ?Including procedures, antibiotic start and stop dates in addition to other pertinent events   ?4/4 admit. ? ?Interim History / Subjective:  ?On 8 NE, SBP 105. ?Feels OK at this point. ? ?Objective:  ?There were no vitals taken for this visit. ?   ?   ?No intake or output data in the 24 hours ending 08/21/21 0559 ?There were no vitals filed for this visit. ? ?Examination: ?General: Adult female, resting in bed, in NAD. ?Neuro: A&O x 3, no deficits. ?HEENT: [place]/AT.  Sclerae anicteric. EOMI. ?Cardiovascular: RRR, no M/R/G.  ?Lungs: Respirations even and unlabored.  CTA bilaterally, No W/R/R. ?Abdomen: BS x 4, soft, NT/ND.  ?Musculoskeletal: No gross deformities, no edema.  ?Skin: Intact, warm, no rashes. Back incision clean with small area that has clear colored drainage. ? ?Imaging:  ?Echo 4/4 > ?LE duplex 4/4 > ? ?Assessment & Plan:  ? ?Concern for PE. ?- Empiric heparin. ?- Echo, LE duplex. ? ?Shock - unclear etiology at this point but could be 2/2 obstructive in setting PE vs septic of unclear source.  Did have recent back surgery; however, fluid drainage appears clear and she has no leukocytosis. ?- PE treatment as above. ?- F/u on echo and can consider engaging IR depending on results. ?- Continue Levophed as needed, goal MAP > 65. ?- Empiric abx. ?- Follow cultures drawn at [facility]. ?- Check PCT. ? ?AKI. ?- Fluids. ?- Follow BMP. ? ?Nausea. ?- Zofran PRN. ? ?Recent back surgery 08/07/21 with mild clear drainage - decompression of L1-L2 with interbody spacer, pedicle fixation L1-S2, and arthrodesis L4 to sacrum. ?- Neurosurgery aware and will see today. ? ?Best practice (evaluated daily):  ?Diet/type: Regular consistency (see orders) ?DVT prophylaxis: systemic heparin ?GI prophylaxis: N/A ?Lines: N/A ?Foley:  N/A ?Code Status:  full code ?Last date of multidisciplinary goals of care discussion: None yet. ? ?Labs   ?CBC: ?No results for input(s): WBC, NEUTROABS, HGB, HCT, MCV, PLT in the last 168 hours. ? ?Basic Metabolic Panel: ?No results for input(s): NA, K, CL, CO2, GLUCOSE, BUN, CREATININE, CALCIUM, MG, PHOS  in the last 168 hours. ?GFR: ?CrCl cannot be calculated (Unknown ideal weight.). ?No results for input(s): PROCALCITON, WBC, LATICACIDVEN in the last 168 hours. ? ?Liver Function Tests: ?No results for input(s): AST, ALT, ALKPHOS, BILITOT, PROT, ALBUMIN in the last 168 hours. ?No results for input(s): LIPASE, AMYLASE in the last 168 hours. ?No results for input(s):  AMMONIA in the last 168 hours. ? ?ABG ?No results found for: PHART, PCO2ART, PO2ART, HCO3, TCO2, ACIDBASEDEF, O2SAT  ? ?Coagulation Profile: ?No results for input(s): INR, PROTIME in the last 168 hours. ? ?Cardiac Enzymes: ?No results for input(s): CKTOTAL, CKMB, CKMBINDEX, TROPONINI in the last 168 hours. ? ?HbA1C: ?No results found for: HGBA1C ? ?CBG: ?No results for input(s): GLUCAP in the last 168 hours. ? ?Review of Systems:   ?All negative; except for those that are bolded, which indicate positives. ? ?Constitutional: weight loss, weight gain, night sweats, fevers, chills, fatigue, weakness.  ?HEENT: headaches, sore throat, sneezing, nasal congestion, post nasal drip, difficulty swallowing, tooth/dental problems, visual complaints, visual changes, ear aches. ?Neuro: difficulty with speech, weakness, numbness, ataxia. ?CV:  chest pain, orthopnea, PND, swelling in lower extremities, dizziness, palpitations, syncope.  ?Resp: cough, hemoptysis, dyspnea, wheezing. ?GI: heartburn, indigestion, abdominal pain, nausea, vomiting, diarrhea, constipation, change in bowel habits, loss of appetite, hematemesis, melena, hematochezia.  ?GU: dysuria, change in color of urine, urgency or frequency, flank pain, hematuria. ?MSK: joint pain or swelling, decreased range of motion. ?Psych: change in mood or affect, depression, anxiety, suicidal ideations, homicidal ideations. ?Skin: rash, itching, bruising. ? ? ?Past Medical History:  ?She,  has a past medical history of Arthritis, Asthma, Asthma, Basal cell carcinoma, COPD (chronic obstructive pulmonary disease) ([place]), GERD (gastroesophageal reflux disease), Heart palpitations, History of hiatal hernia (1991), History of iron deficiency anemia, History of migraine, Hypercholesteremia, Neuropathy, Obesity, Obstructive sleep apnea (07/09/2012), Peripheral neuropathy, Pneumonia, PONV (postoperative nausea and vomiting), Shortness of breath, Stroke ([place]), TIA (transient ischemic  attack) (2014), TIA (transient ischemic attack) (2014), and 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  ? ?Surgical History:  ? ?Past Surgical History:  ?Procedure Laterality Date  ? APPLICATION OF ROBOTIC ASSISTANCE FOR SPINAL PROCEDURE N/A 08/07/2021  ? Procedure: APPLICATION OF ROBOTIC ASSISTANCE FOR SPINAL PROCEDURE;  Surgeon: Kristeen Miss, MD;  Location: [facility];  Service: Neurosurgery;  Laterality: N/A;  ? BREAST BIOPSY Right   ? BREAST CYST EXCISION Left   ? CARDIAC CATHETERIZATION  07/09/2012  ? cataracts Bilateral 2013  ? this was second one  ? CHOLECYSTECTOMY N/A 02/18/2017  ? Procedure: LAPAROSCOPIC CHOLECYSTECTOMY WITH INTRAOPERATIVE CHOLANGIOGRAM;  Surgeon: Erroll Luna, MD;  Location: [facility];  Service: General;  Laterality: N/A;  ? COLONOSCOPY    ? DILATION AND CURETTAGE OF UTERUS    ? EYE SURGERY Bilateral 2014  ? cataract removal 2013 and 2014  ? FINGER SURGERY Right 1971  ? ring finger repair  ? KNEE SURGERY Right   ? arthroscopy  ? LEFT HEART CATHETERIZATION WITH CORONARY ANGIOGRAM N/A 07/09/2012  ? Procedure: LEFT HEART CATHETERIZATION WITH CORONARY ANGIOGRAM;  Surgeon: Sinclair Grooms, MD;  Location: [facility] CATH LAB;  Service: Cardiovascular;  Laterality: N/A;  ? LUMBAR FUSION    ? L3-S1  ? TONSILLECTOMY    ? TOTAL KNEE ARTHROPLASTY Right 12/06/2019  ? Procedure: TOTAL KNEE ARTHROPLASTY;  Surgeon: Gaynelle Arabian, MD;  Location: [facility];  Service: Orthopedics;  Laterality: Right;  62mn  ? TUBAL LIGATION    ?  ? ?Social History:  ? reports that she has never smoked. She  has never used smokeless tobacco. She reports that she does not drink alcohol and does not use drugs.  ? ?Family History:  ?Her family history includes CVA in her father; Diabetes Mellitus II in her mother; Heart attack in her father; Lung cancer in her father; Tuberculosis in her sister.  ? ?Allergies ?Allergies  ?Allergen Reactions  ? Sulfa Antibiotics Other (See Comments) and Palpitations  ? Actifed [Triprolidine-Pse] Palpitations  ?  Lipitor [Atorvastatin] Other (See Comments)  ?  Aches and pains  ? Bupropion Hcl Itching and Rash  ?  ? ?Home Medications  ?Prior to Admission medications   ?Medication Sig Start Date End Date Taking? Auth

## 2021-08-21 NOTE — Progress Notes (Addendum)
? ?NAME:  Christy Stephenson, MRN:  [id number], DOB:  10-Jan-1941, LOS: 0 ?ADMISSION DATE:  08/21/2021, CONSULTATION DATE:  08/21/21 ?REFERRING MD:  [facility] CHIEF COMPLAINT:  Weakness  ? ?History of Present Illness:  ?Christy Stephenson is a 81 y.o. female who has a PMH as below.  She had recent admission to our hospital 08/07/21 for decompression of L1-L2 with interbody spacer, pedicle fixation L1-S2, and arthrodesis L4 to sacrum. ? ?She did well post op up until around 4/2 when she began to have generalized weakness and fever.  Symptoms progressed and 4/4, she started having dyspnea, N/V/D as well as increased drainage from incision site, non-purulent. ? ?In ED, [place] elevated to [address], lactate 4.9, SCr 2.8, WBC 5.6. She was also hypotensive to the 60s and hypoxic requiring [place].  She was placed on o2 and was started on Norepinephrine. ? ?She was unable to get CTA chest given AKI; however, there was strong concern for PE.  She was started on Heparin and was transferred to the ICU at [facility] for ongoing management. ? ? ?Pertinent  Medical History:  ?has IDIOPATHIC PROGRESSIVE POLYNEUROPATHY; Asthma with bronchitis; Obstructive sleep apnea; MEMORY LOSS; OTHER CHEST PAIN; ELECTROCARDIOGRAM, ABNORMAL; Dyspnea; Abnormal finding on cardiovascular stress test; Transient global amnesia; Granulomatous lung disease (HCC); OA (osteoarthritis) of knee; Primary osteoarthritis of right knee; Pseudoarthrosis of lumbar spine; Shock ([place]); Acute respiratory failure with hypoxia ([place]); AKI (acute kidney injury) ([place]); and Nausea on their problem list. ? ?Significant Hospital Events: ?Including procedures, antibiotic start and stop dates in addition to other pertinent events   ?4/4 admit. ? ?Interim History / Subjective:  ?On 8 NE, SBP 105. ?Feels OK at this point. ? ?Objective:  ?Blood pressure 95/81, pulse (!) 105, temperature 97.8 ?F (36.6 ?C), temperature source Oral, resp. rate 20, height $RemoveBe'5\' 4"'[id number]$  (1.626 m), weight 107.9 kg, SpO2 97 %. ?   ?    ? ?Intake/Output Summary (Last 24 hours) at 08/21/2021 0929 ?Last data filed at 08/21/2021 0800 ?Gross per 24 hour  ?Intake 261.28 ml  ?Output --  ?Net 261.28 ml  ? ?Filed Weights  ? 08/21/21 0602  ?Weight: 107.9 kg  ? ? ?Examination: ?General: Adult female, resting in bed, in NAD. ?Neuro: A&O x 3, no deficits. ?HEENT: [place]/AT. Sclerae anicteric. EOMI. ?Cardiovascular: RRR, no M/R/G.  ?Lungs: Respirations even and unlabored.  CTA bilaterally, No W/R/R. ?Abdomen: BS x 4, soft, NT/ND.  ?Musculoskeletal: No gross deformities, no edema.  ?Skin: Intact, warm, no rashes. Back incision clean with small area that has clear colored drainage. ? ?Imaging:  ?Echo 4/4 > ?LE duplex 4/4 > ? ?Assessment & Plan:  ? ?Concern for possible PE: elevated D-dimer, post surgery, fever, dyspnea ?P: ?-cont heparin gtt ?-v/q scan ?-trend troponin ?-check bnp ?-echo and LE duplex pending ?-consider IR consult pending results ? ?Shock - unclear etiology at this point but could be 2/2 obstructive in setting PE vs septic of unclear source.  Did have recent back surgery; however, fluid drainage appears clear and she has no leukocytosis. ?Gram negative bacteremia: bc positive from [facility] ?P: ?-heparin gtt for possible PE ?-echo pending ?-cont cefepime/vanc  ?-f/u bcx2, uc ?-f/u bc sensitivites ?-wean levo for MAP >65 ? ?Acute respiratory failure with hypoxia ?Asthma w/ bronchitis: takes trelegy at home ?P: ?-continue to wean [place] for sats >92% ?-breo ellipta and incruse ellipta  ?-prn albuterol for wheezing ?-check CXR ? ?AKI ?P: ?-IV fluids ?-Trend BMP / urinary output ?-Replace electrolytes as indicated ?-Avoid nephrotoxic  agents, ensure adequate renal perfusion ? ?Hyponatremia: likely hypovolemia ?P: ?-giving IV fluids ?-frequent Na checks ?-check serum na, serum osm, urine na, urine osm ?-check cortisol and tsh ? ?Nausea ?P: ?- Zofran PRN ? ?Recent back surgery 08/07/21 with mild clear drainage - decompression of L1-L2 with interbody spacer,  pedicle fixation L1-S2, and arthrodesis L4 to sacrum. ?P: ?-NSG consulted; appreciate recs ?-cont abx as above ? ?Elevated [place] ?Hyperbilirubinemia ?P: ?-RUQ u/s ? ?Idiopathic progressive polyneuropathy  ?P: ?-consider restarting home lyrica ? ?Best practice (evaluated daily):  ?Diet/type: Regular consistency (see orders) ?DVT prophylaxis: systemic heparin ?GI prophylaxis: N/A ?Lines: N/A ?Foley:  N/A ?Code Status:  full code ?Last date of multidisciplinary goals of care discussion: 4/4 spoke with wife and husband at bedside and updated ? ?Labs   ?CBC: ?No results for input(s): WBC, NEUTROABS, HGB, HCT, MCV, PLT in the last 168 hours. ? ?Basic Metabolic Panel: ?No results for input(s): NA, K, CL, CO2, GLUCOSE, BUN, CREATININE, CALCIUM, MG, PHOS in the last 168 hours. ?GFR: ?Estimated Creatinine Clearance: 55.8 mL/min (by C-G formula based on SCr of 0.98 mg/dL). ?Recent Labs  ?Lab 08/21/21 ?6761  ?PROCALCITON 15.03  ? ? ?Liver Function Tests: ?No results for input(s): AST, ALT, ALKPHOS, BILITOT, PROT, ALBUMIN in the last 168 hours. ?No results for input(s): LIPASE, AMYLASE in the last 168 hours. ?No results for input(s): AMMONIA in the last 168 hours. ? ?ABG ?No results found for: PHART, PCO2ART, PO2ART, HCO3, TCO2, ACIDBASEDEF, O2SAT  ? ?Coagulation Profile: ?No results for input(s): INR, PROTIME in the last 168 hours. ? ?Cardiac Enzymes: ?No results for input(s): CKTOTAL, CKMB, CKMBINDEX, TROPONINI in the last 168 hours. ? ?HbA1C: ?No results found for: HGBA1C ? ?CBG: ?Recent Labs  ?Lab 08/21/21 ?0559  ?GLUCAP 98  ? ? ?Review of Systems:   ?All negative; except for those that are bolded, which indicate positives. ? ?Constitutional: weight loss, weight gain, night sweats, fevers, chills, fatigue, weakness.  ?HEENT: headaches, sore throat, sneezing, nasal congestion, post nasal drip, difficulty swallowing, tooth/dental problems, visual complaints, visual changes, ear aches. ?Neuro: difficulty with speech,  weakness, numbness, ataxia. ?CV:  chest pain, orthopnea, PND, swelling in lower extremities, dizziness, palpitations, syncope.  ?Resp: cough, hemoptysis, dyspnea, wheezing. ?GI: heartburn, indigestion, abdominal pain, nausea, vomiting, diarrhea, constipation, change in bowel habits, loss of appetite, hematemesis, melena, hematochezia.  ?GU: dysuria, change in color of urine, urgency or frequency, flank pain, hematuria. ?MSK: joint pain or swelling, decreased range of motion. ?Psych: change in mood or affect, depression, anxiety, suicidal ideations, homicidal ideations. ?Skin: rash, itching, bruising. ? ? 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  ? ?Surgical History:  ? ?Past Surgical History:  ?Procedure Laterality Date  ? APPLICATION OF ROBOTIC ASSISTANCE FOR SPINAL PROCEDURE N/A 08/07/2021  ? Procedure: APPLICATION OF ROBOTIC ASSISTANCE FOR SPINAL PROCEDURE;  Surgeon: Kristeen Miss, MD;  Location: [facility];  Service: Neurosurgery;  Laterality: N/A;  ? BREAST BIOPSY Right   ? BREAST CYST EXCISION Left   ? CARDIAC CATHETERIZATION  07/09/2012  ? cataracts Bilateral 2013  ? this was second one  ?  CHOLECYSTECTOMY N/A 02/18/2017  ? Procedure: LAPAROSCOPIC CHOLECYSTECTOMY WITH INTRAOPERATIVE CHOLANGIOGRAM;  Surgeon: Erroll Luna, MD;  Location: [facility];  Service: General;  Laterality: N/A;  ? COLONOSCOPY    ? DILATION AND CURETTAGE OF UTERUS    ? EYE  SURGERY Bilateral 2014  ? cataract removal 2013 and 2014  ? FINGER SURGERY Right 1971  ? ring finger repair  ? KNEE SURGERY Right   ? arthroscopy  ? LEFT HEART CATHETERIZATION WITH CORONARY ANGIOGRAM N/A 02/20/2

## 2021-08-21 NOTE — Progress Notes (Signed)
Patient with increased work of breathing. Dr Oletta Darter notified. RRT at bedside to administer prn neb treatment. Will follow up after breathing treatment and assess breathing. Will monitor. ?

## 2021-08-21 NOTE — Progress Notes (Signed)
Patient was seen at bedside ? ?Increased work of breathing noted earlier ? ?She states she feels better than she did yesterday ? ?Slight increase in work of breathing ?Clear breath sounds bilaterally ?Did not appreciate any wheezing or rales ? ?Chest x-ray reviewed ?-No acute infiltrate, does not appear different from previous. ? ?She is about 3.5 L positive, remains on Levophed ? ? ?Worsening shortness of breath ? ?Patient is used to using CPAP at home ? ?BiPAP ordered-I think she will tolerate this well ? ? ?

## 2021-08-21 NOTE — Progress Notes (Signed)
Bilateral lower extremity venous duplex has been completed. ?Preliminary results can be found in CV Proc through chart review.  ? ?08/21/21 11:55 AM ?Carlos Levering RVT   ?

## 2021-08-21 NOTE — Progress Notes (Signed)
Pt arrived from OSH via [facility] with 1 purple colored shirt on that pt said could be cut off. Shirt tossed in trash, pt aware. ?

## 2021-08-21 NOTE — Progress Notes (Signed)
Review of protocol consult for US guided IV due to vasopressor order. Pt currently has CVC; consult cleared. ?

## 2021-08-21 NOTE — Progress Notes (Signed)
Pharmacy Antibiotic/Anticoagulation  Note ? ?LORELY BUBB is a 81 y.o. female admitted on 08/21/2021 with sepsis.   ? ?ID: Pharmacy has been consulted for Vancomycin and Cefepime dosing. S/p spinal surgery 08/07/21. ? ?Pt came from [facility] ED - Cefepime and Vancomycin not charted but per verbal report RN at OSH stated they gave Cefepime 2gm ~0300 and Vancomycin 2gm was hangin when pt arrived here. ? ?Scr up to 2.6 at [facility] (BL ~1), est CrCl 74m/min ? ?AC: Pt also started on heparin for suspected PE at OSH. Heparin 5000 units bolus and gtt started at 1800 units - began ~0300. Heparin dosing wt 80 kg. No AC PTA. ? ?Plan: ?Decrease heparin to 1400 units.hr ?Will f/u heparin level in 8 hours ?Daily heparin level and CBC ?Cefepime 2gm IV q24h ?F/u SCr for further vanc dosing. Consider random level ~48 hr post dose at OSH. ?Will f/u micro data and pt's clinical condition  ?Vanc levels prn ?F/u cx from OSH ? ? ?Height: '5\' 4"'$  (162.6 cm) ?Weight: 107.9 kg (237 lb 14 oz) ?IBW/kg (Calculated) : 54.7 ? ?Temp (24hrs), Avg:98.3 ?F (36.8 ?C), Min:98.3 ?F (36.8 ?C), Max:98.3 ?F (36.8 ?C) ? ?No results for input(s): WBC, CREATININE, LATICACIDVEN, VANCOTROUGH, VANCOPEAK, VANCORANDOM, GENTTROUGH, GENTPEAK, GENTRANDOM, TOBRATROUGH, TOBRAPEAK, TOBRARND, AMIKACINPEAK, AMIKACINTROU, AMIKACIN in the last 168 hours.  ?Estimated Creatinine Clearance: 55.8 mL/min (by C-G formula based on SCr of 0.98 mg/dL).   ? ?Allergies  ?Allergen Reactions  ? Sulfa Antibiotics Other (See Comments) and Palpitations  ? Actifed [Triprolidine-Pse] Palpitations  ? Lipitor [Atorvastatin] Other (See Comments)  ?  Aches and pains  ? Bupropion Hcl Itching and Rash  ? ? ?Antimicrobials this admission: ?4/4 Vanc >>  ?4/4 Cefepime >>  ? ?Microbiology results: ?4/4 BCx ([facility] ?4/4 MRSA PCR:  ? ?Thank you for allowing pharmacy to be a part of this patient?s care. ? ?CSherlon Handing PharmD, BCPS ?Please see amion for complete clinical pharmacist phone  list ?08/21/2021 6:42 AM ? ?

## 2021-08-21 NOTE — Progress Notes (Signed)
?  Echocardiogram ?2D Echocardiogram has been performed. ? ?Bobbye Charleston ?08/21/2021, 11:51 AM ?

## 2021-08-21 NOTE — Progress Notes (Signed)
eLink Physician-Brief Progress Note ?Patient Name: Christy Stephenson ?DOB: 05/14/1941 ?MRN: [id number] ? ? ?Date of Service ? 08/21/2021  ?HPI/Events of Note ? Increased wheezing and SOB in spite of Albuterol Neb.   ?eICU Interventions ? Plan: ?ABG STAT. ?Portable CXR STAT. ?Trial of BiPAP. ?Will ask the PCCM ground team to evaluate the patient at bedside.   ? ? ? ?Intervention Category ?Major Interventions: Hypoxemia - evaluation and management ? ?Tasharra Nodine Cornelia Copa ?08/21/2021, 9:42 PM ?

## 2021-08-21 NOTE — Consult Note (Signed)
BP (!) 76/36   Pulse 82   Temp 97.9 ?F (36.6 ?C) (Oral)   Resp (!) 28   Ht '5\' 4"'$  (1.626 m)   Wt 107.9 kg   SpO2 99%   BMI 40.83 kg/m?  ?OB:SJGGEZ Christy Stephenson is Christy 81 y.o. female ?Whom was transferred to the CCM service due to presumed sepsis, possible DVT, shock, overall weakness, fall and an acute kidney injury. There was Christy report of wound drainage, and she was febrile on admission.  ?Allergies  ?Allergen Reactions  ? Sulfa Antibiotics Other (See Comments) and Palpitations  ? Actifed [Triprolidine-Pse] Palpitations  ? Lipitor [Atorvastatin] Other (See Comments)  ?  Aches and pains  ? Bupropion Hcl Itching and Rash  ? ?Past Medical History:  ?Diagnosis Date  ? Arthritis   ? osteo  ? Asthma   ? Asthma   ? Basal cell carcinoma   ? face  ? COPD (chronic obstructive pulmonary disease) ([place])   ? GERD (gastroesophageal reflux disease)   ? Heart palpitations   ? History of hiatal hernia 1991  ? History of iron deficiency anemia   ? History of migraine   ? while taking birth control pills  ? Hypercholesteremia   ? Neuropathy   ? left greater than right feet  ? Obesity   ? Obstructive sleep apnea 07/09/2012  ? CPAP   ? Peripheral neuropathy   ? Pneumonia   ? as Christy child  ? PONV (postoperative nausea and vomiting)   ? not since 1967  ? Shortness of breath   ? with exertion  ? Stroke [facility])   ? TIA (transient ischemic attack) 2014  ? TIA (transient ischemic attack) 2014  ? no residual - no follow up needed  ? 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 ?Past Surgical History:  ?Procedure Laterality Date  ? APPLICATION OF ROBOTIC ASSISTANCE FOR SPINAL PROCEDURE N/Christy 08/07/2021  ? Procedure: APPLICATION OF ROBOTIC ASSISTANCE FOR SPINAL PROCEDURE;  Surgeon: Kristeen Miss, MD;  Location: [place];  Service: Neurosurgery;  Laterality: N/Christy;  ? BREAST BIOPSY Right   ? BREAST CYST EXCISION Left   ? CARDIAC CATHETERIZATION  07/09/2012  ? cataracts Bilateral 2013  ? this was second one  ? CHOLECYSTECTOMY N/Christy 02/18/2017  ? Procedure: LAPAROSCOPIC  CHOLECYSTECTOMY WITH INTRAOPERATIVE CHOLANGIOGRAM;  Surgeon: Erroll Luna, MD;  Location: [place];  Service: General;  Laterality: N/Christy;  ? COLONOSCOPY    ? DILATION AND CURETTAGE OF UTERUS    ? EYE SURGERY Bilateral 2014  ? cataract removal 2013 and 2014  ? FINGER SURGERY Right 1971  ? ring finger repair  ? KNEE SURGERY Right   ? arthroscopy  ? LEFT HEART CATHETERIZATION WITH CORONARY ANGIOGRAM N/Christy 07/09/2012  ? Procedure: LEFT HEART CATHETERIZATION WITH CORONARY ANGIOGRAM;  Surgeon: Sinclair Grooms, MD;  Location: [facility] C[facility] LAB;  Service: Cardiovascular;  Laterality: N/Christy;  ? LUMBAR FUSION    ? L3-S1  ? TONSILLECTOMY    ? TOTAL KNEE ARTHROPLASTY Right 12/06/2019  ? Procedure: TOTAL KNEE ARTHROPLASTY;  Surgeon: Gaynelle Arabian, MD;  Location: [facility];  Service: Orthopedics;  Laterality: Right;  33mn  ? TUBAL LIGATION    ? ?Prior to Admission medications   ?Medication Sig Start Date End Date Taking? Authorizing Provider  ?albuterol (VENTOLIN HFA) 108 (90 Base) MCG/ACT inhaler Inhale 2 puffs into the lungs every 6 (six) hours as needed for wheezing or shortness of breath. 05/01/20  Yes YBaird LyonsD, MD  ?Ascorbic Acid (VITAMIN C) 1000  MG tablet Take 1,000 mg by mouth daily.   Yes [provider]  ?CALCIUM-VITAMIN D PO Take 1 tablet by mouth in the morning and at bedtime.   Yes [provider]  ?Cholecalciferol (VITAMIN D) 125 MCG (5000 UT) CAPS Take 5,000 Units by mouth daily.   Yes [provider]  ?cholestyramine (QUESTRAN) 4 g packet Take 4 g by mouth daily.   Yes [provider]  ?diclofenac (VOLTAREN) 75 MG EC tablet Take 75 mg by mouth 2 (two) times daily. 03/31/21  Yes [provider]  ?diclofenac Sodium (VOLTAREN) 1 % GEL Apply 2 g topically daily as needed (pain).   Yes [provider]  ?Esomeprazole Magnesium (NEXIUM 24HR PO) Take 22.3 mg by mouth daily.   Yes [provider]  ?estradiol (VIVELLE-DOT) 0.025 MG/24HR Place 1 patch onto the skin  2 (two) times Christy week.   Yes [provider]  ?Fluticasone-Umeclidin-Vilant (TRELEGY ELLIPTA) 100-62.5-25 MCG/INH AEPB INHALE 1 PUFF ONCE DAILY ?Patient taking differently: Take 1 puff by mouth daily. 02/09/21  Yes Young, Tarri Fuller D, MD  ?LORazepam (ATIVAN) 0.5 MG tablet Take 0.25 mg by mouth daily as needed for anxiety.   Yes [provider]  ?methocarbamol (ROBAXIN) 500 MG tablet Take 1 tablet (500 mg total) by mouth every 6 (six) hours as needed for muscle spasms. 08/09/21  Yes Kristeen Miss, MD  ?Multiple Vitamin (MULTIVITAMIN WITH MINERALS) TABS tablet Take 1 tablet by mouth daily.   Yes [provider]  ?ondansetron (ZOFRAN) 4 MG tablet Take 1 tablet (4 mg total) by mouth every 6 (six) hours as needed for nausea or vomiting. 08/09/21  Yes Kristeen Miss, MD  ?OVER THE COUNTER MEDICATION Take 1 capsule by mouth daily. Leg Veins   Yes [provider]  ?oxyCODONE-acetaminophen (PERCOCET/ROXICET) 5-325 MG tablet Take 1-2 tablets by mouth every 4 (four) hours as needed for moderate pain or severe pain. 08/09/21  Yes Kristeen Miss, MD  ?pregabalin (LYRICA) 100 MG capsule Take 100-200 mg by mouth See admin instructions. Take 100 mg by mouth in the morning and 200 mg in the evening 07/13/21  Yes [provider]  ?progesterone (PROMETRIUM) 100 MG capsule Take 100 mg by mouth at bedtime.    Yes [provider]  ?Propylene Glycol (SYSTANE BALANCE OP) Place 1 drop into both eyes in the morning and at bedtime.   Yes [provider]  ?zinc gluconate 50 MG tablet Take 50 mg by mouth daily.   Yes [provider]  ?dexamethasone (DECADRON) 1 MG tablet 2 tablets twice daily for 2 days, one tablet twice daily for 2 days, one tablet daily for 2 days. ?Patient not taking: Reported on 08/21/2021 08/09/21   Kristeen Miss, MD  ? ?NM Pulmonary Perfusion ? ?Result Date: 08/21/2021 ?CLINICAL DATA:  Generalized weakness, elevated D-dimer recent lumbar spine surgery EXAM: NUCLEAR  MEDICINE PERFUSION LUNG SCAN TECHNIQUE: Perfusion images were obtained in multiple projections after intravenous injection of radiopharmaceutical. Ventilation scans intentionally deferred if perfusion scan and chest x-ray adequate for interpretation during COVID 19 epidemic. RADIOPHARMACEUTICALS:  4.2 mCi Tc-31mMAA IV COMPARISON:  Chest radiograph done earlier today FINDINGS: There is moderate sized area of decreased perfusion in the posterior left mid and left lower lung fields. There is no matching infiltrate in the chest radiograph. There is no definite demonstrable wedge-shaped perfusion defects. IMPRESSION: Moderate sized areas of decreased perfusion are noted in the posterior left mid and left lower lung fields. The study is inconclusive to  evaluate for pulmonary embolism. If there is continued clinical suspicion for pulmonary embolism, please consider venous Doppler examination of both lower extremities and possibly CT angiogram. Electronically Signed   By: Elmer Picker M.D.   On: 08/21/2021 14:23  ? ?DG CHEST PORT 1 VIEW ? ?Result Date: 08/21/2021 ?CLINICAL DATA:  Shortness breath EXAM: PORTABLE CHEST 1 VIEW COMPARISON:  Chest x-ray dated May 07, 2021; chest CT dated May 15, 2021 FINDINGS: Cardiac and mediastinal contours are unchanged. Left IJ line with tip projecting over the confluence of the brachiocephalic vein and SVC. Stable calcified nodule the right mid lung. Possibly near opacity of the mid left lung, likely due to scarring or atelectasis when compared with most recent prior chest CT. Lungs otherwise clear. Old left-sided rib fractures. No evidence pleural effusion or pneumothorax. IMPRESSION: Left IJ line with tip projecting over the confluence of the brachiocephalic vein and SVC. Electronically Signed   By: Yetta Glassman M.D.   On: 08/21/2021 09:31  ? ?ECHOCARDIOGRAM COMPLETE ? ?Result Date: 08/21/2021 ?   ECHOCARDIOGRAM REPORT   Patient Name:   Christy Stephenson Date of Exam:  08/21/2021 Medical Rec #:  [id number]      Height:       64.0 in Accession #:    [id number]     Weight:       237.9 lb Date of Birth:  Feb 11, 1941      BSA:          2.106 m? Patient Age:    77 years       BP:

## 2021-08-22 DIAGNOSIS — R7881 Bacteremia: Secondary | ICD-10-CM | POA: Diagnosis not present

## 2021-08-22 DIAGNOSIS — A419 Sepsis, unspecified organism: Secondary | ICD-10-CM

## 2021-08-22 DIAGNOSIS — R579 Shock, unspecified: Secondary | ICD-10-CM | POA: Diagnosis not present

## 2021-08-22 DIAGNOSIS — E871 Hypo-osmolality and hyponatremia: Secondary | ICD-10-CM

## 2021-08-22 DIAGNOSIS — R6521 Severe sepsis with septic shock: Secondary | ICD-10-CM | POA: Diagnosis not present

## 2021-08-22 LAB — COMPREHENSIVE METABOLIC PANEL
ALT: 58 U/L — ABNORMAL HIGH (ref 0–44)
AST: 51 U/L — ABNORMAL HIGH (ref 15–41)
Albumin: 1.9 g/dL — ABNORMAL LOW (ref 3.5–5.0)
Alkaline Phosphatase: 292 U/L — ABNORMAL HIGH (ref 38–126)
Anion gap: 11 (ref 5–15)
BUN: 39 mg/dL — ABNORMAL HIGH (ref 8–23)
CO2: 23 mmol/L (ref 22–32)
Calcium: 7.5 mg/dL — ABNORMAL LOW (ref 8.9–10.3)
Chloride: 103 mmol/L (ref 98–111)
Creatinine, Ser: 2.07 mg/dL — ABNORMAL HIGH (ref 0.44–1.00)
GFR, Estimated: 24 mL/min — ABNORMAL LOW (ref >60)
Glucose, Bld: 109 mg/dL — ABNORMAL HIGH (ref 70–99)
Potassium: 3.5 mmol/L (ref 3.5–5.1)
Sodium: 137 mmol/L (ref 135–145)
Total Bilirubin: 3.1 mg/dL — ABNORMAL HIGH (ref 0.3–1.2)
Total Protein: 4.6 g/dL — ABNORMAL LOW (ref 6.5–8.1)

## 2021-08-22 LAB — HEPARIN LEVEL (UNFRACTIONATED)
Heparin Unfractionated: 0.4 IU/mL (ref 0.30–0.70)
Heparin Unfractionated: 0.37 IU/mL (ref 0.30–0.70)

## 2021-08-22 LAB — PROCALCITONIN: Procalcitonin: 13.05 ng/mL

## 2021-08-22 LAB — CBC
HCT: 27.4 % — ABNORMAL LOW (ref 36.0–46.0)
Hemoglobin: 9.7 g/dL — ABNORMAL LOW (ref 12.0–15.0)
MCH: 31.8 pg (ref 26.0–34.0)
MCHC: 35.4 g/dL (ref 30.0–36.0)
MCV: 89.8 fL (ref 80.0–100.0)
Platelets: 90 10*3/uL — ABNORMAL LOW (ref 150–400)
RBC: 3.05 MIL/uL — ABNORMAL LOW (ref 3.87–5.11)
RDW: 14.2 % (ref 11.5–15.5)
WBC: 10.1 10*3/uL (ref 4.0–10.5)
nRBC: 0 % (ref 0.0–0.2)

## 2021-08-22 LAB — BASIC METABOLIC PANEL
Anion gap: 10 (ref 5–15)
BUN: 40 mg/dL — ABNORMAL HIGH (ref 8–23)
CO2: 23 mmol/L (ref 22–32)
Calcium: 7.6 mg/dL — ABNORMAL LOW (ref 8.9–10.3)
Chloride: 103 mmol/L (ref 98–111)
Creatinine, Ser: 1.96 mg/dL — ABNORMAL HIGH (ref 0.44–1.00)
GFR, Estimated: 26 mL/min — ABNORMAL LOW (ref >60)
Glucose, Bld: 145 mg/dL — ABNORMAL HIGH (ref 70–99)
Potassium: 3.4 mmol/L — ABNORMAL LOW (ref 3.5–5.1)
Sodium: 136 mmol/L (ref 135–145)

## 2021-08-22 LAB — MAGNESIUM: Magnesium: 1.5 mg/dL — ABNORMAL LOW (ref 1.7–2.4)

## 2021-08-22 LAB — PHOSPHORUS: Phosphorus: 4 mg/dL (ref 2.5–4.6)

## 2021-08-22 MED ORDER — MAGNESIUM SULFATE 2 GM/50ML IV SOLN
2.0000 g | Freq: Once | INTRAVENOUS | Status: AC
  Administered 2021-08-22: 2 g via INTRAVENOUS
  Filled 2021-08-22: qty 50

## 2021-08-22 MED ORDER — ACETAMINOPHEN 325 MG PO TABS
650.0000 mg | ORAL_TABLET | Freq: Four times a day (QID) | ORAL | Status: DC | PRN
  Administered 2021-08-22 – 2021-08-27 (×6): 650 mg via ORAL
  Filled 2021-08-22 (×6): qty 2

## 2021-08-22 MED ORDER — POTASSIUM CHLORIDE CRYS ER 20 MEQ PO TBCR
20.0000 meq | EXTENDED_RELEASE_TABLET | Freq: Once | ORAL | Status: AC
  Administered 2021-08-22: 20 meq via ORAL
  Filled 2021-08-22: qty 1

## 2021-08-22 MED ORDER — OXYCODONE HCL 5 MG PO TABS
5.0000 mg | ORAL_TABLET | ORAL | Status: DC | PRN
Start: 2021-08-22 — End: 2021-08-27
  Administered 2021-08-22 – 2021-08-24 (×3): 5 mg via ORAL
  Filled 2021-08-22 (×3): qty 1

## 2021-08-22 MED ORDER — PANTOPRAZOLE SODIUM 40 MG PO TBEC
40.0000 mg | DELAYED_RELEASE_TABLET | Freq: Every day | ORAL | Status: DC
  Administered 2021-08-22 – 2021-08-27 (×6): 40 mg via ORAL
  Filled 2021-08-22 (×6): qty 1

## 2021-08-22 MED ORDER — POTASSIUM CHLORIDE CRYS ER 20 MEQ PO TBCR
60.0000 meq | EXTENDED_RELEASE_TABLET | Freq: Once | ORAL | Status: AC
Start: 2021-08-22 — End: 2021-08-22
  Administered 2021-08-22: 60 meq via ORAL
  Filled 2021-08-22: qty 3

## 2021-08-22 NOTE — Progress Notes (Signed)
? ?NAME:  Christy Stephenson, MRN:  [id number], DOB:  Dec 06, 1940, LOS: 1 ?ADMISSION DATE:  08/21/2021, CONSULTATION DATE:  08/21/21 ?REFERRING MD:  [facility] CHIEF COMPLAINT:  Weakness  ? ?History of Present Illness:  ?Christy Stephenson is a 81 y.o. female who has a PMH as below.  She had recent admission to our hospital 08/07/21 for decompression of L1-L2 with interbody spacer, pedicle fixation L1-S2, and arthrodesis L4 to sacrum. ? ?She did well post op up until around 4/2 when she began to have generalized weakness and fever.  Symptoms progressed and 4/4, she started having dyspnea, N/V/D as well as increased drainage from incision site, non-purulent. ? ?In ED, [place] [address], lactate 4.9, SCr 2.8, WBC 5.6. She was also hypotensive to the 60s and hypoxic requiring [place].  She was placed on o2 and was started on Norepinephrine. ? ?She was unable to get CTA chest given AKI; however, there was strong concern for PE.  She was started on Heparin and was transferred to the ICU at [facility] for ongoing management. ? ? ?Pertinent  Medical History:  ?has IDIOPATHIC PROGRESSIVE POLYNEUROPATHY; Asthma with bronchitis; Obstructive sleep apnea; MEMORY LOSS; OTHER CHEST PAIN; ELECTROCARDIOGRAM, ABNORMAL; Dyspnea; Abnormal finding on cardiovascular stress test; Transient global amnesia; Granulomatous lung disease (HCC); OA (osteoarthritis) of knee; Primary osteoarthritis of right knee; Pseudoarthrosis of lumbar spine; Shock ([place]); Acute respiratory failure with hypoxia ([place]); AKI (acute kidney injury) ([place]); Nausea; and Septic shock ([place]) on their problem list. ? ?Significant Hospital Events: ?Including procedures, antibiotic start and stop dates in addition to other pertinent events   ?4/4 admit to [facility]; started on heparin gtt for possible PE; V/Q w/ possible ?4/5:  ? ?Interim History / Subjective:  ?On 7 NE ?Overnight got SOB and required bipap ?This am breathing feels much better; on 2 L [place] ? ?Objective:  ?Blood pressure 105/69,  pulse (!) 101, temperature 98.3 ?F (36.8 ?C), temperature source Axillary, resp. rate 20, height $RemoveBe'5\' 4"'UsUYpxZnB$  (1.626 m), weight 112.9 kg, SpO2 99 %. ?   ?   ? ?Intake/Output Summary (Last 24 hours) at 08/22/2021 0834 ?Last data filed at 08/22/2021 0800 ?Gross per 24 hour  ?Intake 6591.95 ml  ?Output 1900 ml  ?Net 4691.95 ml  ? ? ?Filed Weights  ? 08/21/21 0602 08/22/21 0500  ?Weight: 107.9 kg 112.9 kg  ? ? ?Examination: ?General: Adult female, resting in bed, in NAD. ?Neuro: A&O x 3, no deficits. ?HEENT: [place]/AT. Sclerae anicteric. EOMI. ?Cardiovascular: RRR, no M/R/G.  ?Lungs: Respirations even and unlabored.  CTA bilaterally, No W/R/R. ?Abdomen: BS x 4, soft, NT/ND.  ?Musculoskeletal: No gross deformities, no edema.  ?Skin: Intact, warm, no rashes. Back incision clean with small area that has clear colored drainage. ? ?Imaging:  ?Echo 4/4 > LVEF 30-35%; mod decreased fx; global hypokinesis; mild LVH; grade I diastolic dysfunction; RV fx mildly reduced; RV mildly enlarged; mod elevated PA systolic pressure. ? ?LE duplex 4/4 > no dvt ? ?V/Q 4/4>mod decreased perfusion in posterior L mid and LLL; inclonclusive for PE ? ?RUQ [place] 4/4>mild dilatation of common bile duct post cholecystectomy. Consider MRCP/MRI vs CT ? ?Assessment & Plan:  ? ?Concern for possible PE: elevated D-dimer, post surgery, fever, dyspnea; DVT US negative; V/Q inconclusive for PE but mod decreased perfusion in posterior L mid and LLL  ?Elevated Troponin: NSTEMI vs. Demand ischemia ?P: ?-cont heparin gtt ?-continuous telemetry monitoring in ICU ?-consider cards consult ?-unable to obtain CTA chest with AKI ? ?Shock - unclear  etiology at this point but could be 2/2 obstructive in setting PE vs septic of unclear source.  Did have recent back surgery; however, fluid drainage appears clear and she has no leukocytosis. ?Gram negative bacteremia: bc positive from [facility] ?P: ?-heparin gtt ?-cont cefepime ?-vanc stopped 4/4 ?-f/u b/c sensitivities ?-f/u UC ?-wean  levo for MAP >65 ? ?Acute respiratory failure with hypoxia ?Asthma w/ bronchitis: takes trelegy at home ?OSA: on cpap at bedtime ?P: ?-continue to wean [place] for sats >92% ?-bipap qhs and prn ?-breo ellipta and incruse ellipta  ?-prn albuterol for wheezing ? 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 ?P: ?-stop IV fluids ?-strict I/O's; daily weights ? ?AKI ?Metabolic acidosis ?P: ?-will stop IV fluids today ?-consider stopping bicarb drip pending BMP results this am ?-Trend BMP / urinary output ?-Replace electrolytes as indicated ?-Avoid nephrotoxic agents, ensure adequate renal perfusion ? ?Hyponatremia: likely hypovolemia ?P: ?-will stop IV fluids today ?-trend Na ? ?Nausea ?P: ?- Zofran and phenergan PRN ? ?Recent back surgery 08/07/21 with mild clear drainage - decompression of L1-L2 with interbody spacer, pedicle fixation L1-S2, and arthrodesis L4 to sacrum. ?P: ?-NSG consulted; appreciate recs ?-cont abx as above ? ?Elevated [place] ?Hyperbilirubinemia ?P: ?-RUQ u/s no acute findings ?-consider ct abd ? ?Subclinical hyperthyroidism: TSH low normal T4 ?P: ?-will need further f/u outpatient ? ?Idiopathic progressive polyneuropathy  ?P: ?-consider restarting home lyrica ? ?Best practice (evaluated daily):  ?Diet/type: Regular consistency (see orders) ?DVT prophylaxis: systemic heparin ?GI prophylaxis: PPI ?Lines: N/A ?Foley:  N/A ?Code Status:  full code ?Last date of multidisciplinary goals of care discussion: 19/5 spoke with wife at bedside and updated ?  ? ?Critical care time: 35 min.  ? ?Mikki Harbor, PA-C ?[facility] ?08/22/2021, 8:34 AM ? ?Please see [email or web address] for pager details. ? ?From 7A-7P if no response, please call [phone]. ?After hours, please call [place] [phone]. ? ? ? ? ?

## 2021-08-22 NOTE — Care Management (Signed)
?  Transition of Care (TOC) Screening Note ? ? ?Patient Details  ?Name: Christy Stephenson ?Date of Birth: 03/03/1941 ? ? ?Transition of Care (TOC) CM/SW Contact:    ?Carles Collet, RN ?Phone Number: ?08/22/2021, 7:48 AM ? ? ? ?Transition of Care Department [facility]) has reviewed patient and we will continue to monitor patient this critically ill patient's advancement through interdisciplinary progression rounds.  ?  ?

## 2021-08-22 NOTE — Progress Notes (Signed)
Pharmacy Antibiotic/Anticoagulation  Note ? ?Christy Stephenson is a 81 y.o. female admitted on 08/21/2021 with sepsis.   ? ?ID: Pharmacy has been consulted for Vancomycin and Cefepime dosing. S/p spinal surgery 08/07/21. Will follow up with cultures from OSH ? ?AC: Pt also started on heparin for suspected PE at OSH. Heparin dosing wt 80 kg. No AC PTA.  Troponins elevated ? ?Heparin level came back at 0.37 tonight. We are shooting the upper end of goal so we will increase the heparin slightly and check in Am.  ? ?Plan: ?Increase heparin to 1450 units/hr to maintain on higher end of range with PE ?Daily heparin level and CBC ? ? ?Cefepime 2gm IV q24h ?F/u cx from OSH ? ? ?Height: '5\' 4"'$  (162.6 cm) ?Weight: 112.9 kg (248 lb 14.4 oz) ?IBW/kg (Calculated) : 54.7 ? ?Temp (24hrs), Avg:98.6 ?F (37 ?C), Min:98.3 ?F (36.8 ?C), Max:98.9 ?F (37.2 ?C) ? ?Recent Labs  ?Lab 08/21/21 ?1246 08/21/21 ?1719 08/21/21 ?1808 08/22/21 ?5701 08/22/21 ?1638  ?WBC  --   --   --  10.1  --   ?CREATININE 2.78*  --  2.36* 2.07* 1.96*  ?LATICACIDVEN 3.9* 3.0*  --   --   --   ? ?  ?Estimated Creatinine Clearance: 28.7 mL/min (A) (by C-G formula based on SCr of 1.96 mg/dL (H)).   ? ?Allergies  ?Allergen Reactions  ? Sulfa Antibiotics Other (See Comments) and Palpitations  ? Actifed [Triprolidine-Pse] Palpitations  ? Lipitor [Atorvastatin] Other (See Comments)  ?  Aches and pains  ? Bupropion Hcl Itching and Rash  ? ? ?Antimicrobials this admission: ?4/4 Vanc ?4/4 Cefepime >>  ? ?Microbiology results: ?4/4 BCx [facility]): GNR ?4/4 MRSA PCR: negative ?4/4 BCX: IP ? ?Onnie Boer, PharmD, BCIDP, AAHIVP, CPP ?Infectious Disease Pharmacist ?08/22/2021 7:40 PM ? ? ? ? ? ?

## 2021-08-22 NOTE — Progress Notes (Signed)
Pharmacy Antibiotic/Anticoagulation  Note ? ?Christy Stephenson is a 81 y.o. female admitted on 08/21/2021 with sepsis.   ? ?ID: Pharmacy has been consulted for Vancomycin and Cefepime dosing. S/p spinal surgery 08/07/21. Will follow up with cultures from OSH ? ?AC: Pt also started on heparin for suspected PE at OSH. Heparin dosing wt 80 kg. No AC PTA.  Troponins elevated ? ?Heparin level came back at 0.4. ? ?Plan: ?Increase heparin to 1350 units/hr to maintain on higher end of range with PE ?Will f/u heparin level at 1800 ?Daily heparin level and CBC ? ? ?Cefepime 2gm IV q24h ?F/u cx from OSH ? ? ?Height: '5\' 4"'$  (162.6 cm) ?Weight: 112.9 kg (248 lb 14.4 oz) ?IBW/kg (Calculated) : 54.7 ? ?Temp (24hrs), Avg:98.4 ?F (36.9 ?C), Min:97.7 ?F (36.5 ?C), Max:98.9 ?F (37.2 ?C) ? ?Recent Labs  ?Lab 08/21/21 ?1246 08/21/21 ?1719 08/21/21 ?1808 08/22/21 ?0905  ?WBC  --   --   --  10.1  ?CREATININE 2.78*  --  2.36* 2.07*  ?LATICACIDVEN 3.9* 3.0*  --   --   ? ?  ?Estimated Creatinine Clearance: 27.1 mL/min (A) (by C-G formula based on SCr of 2.07 mg/dL (H)).   ? ?Allergies  ?Allergen Reactions  ? Sulfa Antibiotics Other (See Comments) and Palpitations  ? Actifed [Triprolidine-Pse] Palpitations  ? Lipitor [Atorvastatin] Other (See Comments)  ?  Aches and pains  ? Bupropion Hcl Itching and Rash  ? ? ?Antimicrobials this admission: ?4/4 Vanc ?4/4 Cefepime >>  ? ?Microbiology results: ?4/4 BCx [facility]): GNR ?4/4 MRSA PCR: negative ?4/4 BCX: IP ? ?Thank you for allowing pharmacy to be a part of this patient?s care. ? ?Donnald Garre, PharmD ?Clinical Pharmacist ? ?Please check AMION for all [place] numbers ?After 10:00 PM, call [place] [phone] ? ? ? ?

## 2021-08-23 ENCOUNTER — Other Ambulatory Visit (HOSPITAL_COMMUNITY): Payer: Self-pay

## 2021-08-23 DIAGNOSIS — B9620 Unspecified Escherichia coli [E. coli] as the cause of diseases classified elsewhere: Secondary | ICD-10-CM | POA: Diagnosis not present

## 2021-08-23 DIAGNOSIS — N179 Acute kidney failure, unspecified: Secondary | ICD-10-CM | POA: Diagnosis not present

## 2021-08-23 DIAGNOSIS — R7881 Bacteremia: Secondary | ICD-10-CM | POA: Diagnosis not present

## 2021-08-23 LAB — CBC
HCT: 28.2 % — ABNORMAL LOW (ref 36.0–46.0)
Hemoglobin: 9.3 g/dL — ABNORMAL LOW (ref 12.0–15.0)
MCH: 30.1 pg (ref 26.0–34.0)
MCHC: 33 g/dL (ref 30.0–36.0)
MCV: 91.3 fL (ref 80.0–100.0)
Platelets: 53 10*3/uL — ABNORMAL LOW (ref 150–400)
RBC: 3.09 MIL/uL — ABNORMAL LOW (ref 3.87–5.11)
RDW: 14.6 % (ref 11.5–15.5)
WBC: 9.5 10*3/uL (ref 4.0–10.5)
nRBC: 0 % (ref 0.0–0.2)

## 2021-08-23 LAB — URINE CULTURE

## 2021-08-23 LAB — HEPARIN LEVEL (UNFRACTIONATED): Heparin Unfractionated: 0.28 [IU]/mL — ABNORMAL LOW (ref 0.30–0.70)

## 2021-08-23 LAB — COMPREHENSIVE METABOLIC PANEL
ALT: 55 U/L — ABNORMAL HIGH (ref 0–44)
AST: 39 U/L (ref 15–41)
Albumin: 1.8 g/dL — ABNORMAL LOW (ref 3.5–5.0)
Alkaline Phosphatase: 264 U/L — ABNORMAL HIGH (ref 38–126)
Anion gap: 6 (ref 5–15)
BUN: 38 mg/dL — ABNORMAL HIGH (ref 8–23)
CO2: 26 mmol/L (ref 22–32)
Calcium: 7.9 mg/dL — ABNORMAL LOW (ref 8.9–10.3)
Chloride: 108 mmol/L (ref 98–111)
Creatinine, Ser: 1.67 mg/dL — ABNORMAL HIGH (ref 0.44–1.00)
GFR, Estimated: 31 mL/min — ABNORMAL LOW (ref >60)
Glucose, Bld: 108 mg/dL — ABNORMAL HIGH (ref 70–99)
Potassium: 4.2 mmol/L (ref 3.5–5.1)
Sodium: 140 mmol/L (ref 135–145)
Total Bilirubin: 1.9 mg/dL — ABNORMAL HIGH (ref 0.3–1.2)
Total Protein: 4.6 g/dL — ABNORMAL LOW (ref 6.5–8.1)

## 2021-08-23 LAB — MAGNESIUM: Magnesium: 2.5 mg/dL — ABNORMAL HIGH (ref 1.7–2.4)

## 2021-08-23 LAB — PROCALCITONIN: Procalcitonin: 8.23 ng/mL

## 2021-08-23 MED ORDER — SODIUM CHLORIDE 0.9% FLUSH: 10.0000 mL | INTRAVENOUS | Status: DC | PRN

## 2021-08-23 MED ORDER — PREGABALIN 100 MG PO CAPS
100.0000 mg | ORAL_CAPSULE | Freq: Every morning | ORAL | Status: DC
  Administered 2021-08-23 – 2021-08-27 (×5): 100 mg via ORAL
  Filled 2021-08-23: qty 2
  Filled 2021-08-23 (×3): qty 1

## 2021-08-23 MED ORDER — WHITE PETROLATUM EX OINT
TOPICAL_OINTMENT | CUTANEOUS | Status: DC | PRN
  Filled 2021-08-23: qty 28.35

## 2021-08-23 MED ORDER — PREGABALIN 100 MG PO CAPS
200.0000 mg | ORAL_CAPSULE | Freq: Every evening | ORAL | Status: DC
  Administered 2021-08-23 – 2021-08-26 (×4): 200 mg via ORAL
  Filled 2021-08-23 (×5): qty 2

## 2021-08-23 MED ORDER — CARVEDILOL 3.125 MG PO TABS
3.1250 mg | ORAL_TABLET | Freq: Two times a day (BID) | ORAL | Status: DC
Start: 2021-08-23 — End: 2021-08-23

## 2021-08-23 MED ORDER — SODIUM CHLORIDE 0.9% FLUSH
10.0000 mL | Freq: Two times a day (BID) | INTRAVENOUS | Status: DC
  Administered 2021-08-23 – 2021-08-27 (×6): 10 mL

## 2021-08-23 MED ORDER — FUROSEMIDE 10 MG/ML IJ SOLN
20.0000 mg | Freq: Once | INTRAMUSCULAR | Status: AC
  Administered 2021-08-23: 20 mg via INTRAVENOUS
  Filled 2021-08-23: qty 2

## 2021-08-23 MED ORDER — SODIUM CHLORIDE 0.9 % IV SOLN
2.0000 g | INTRAVENOUS | Status: DC
  Administered 2021-08-24 – 2021-08-25 (×2): 2 g via INTRAVENOUS
  Filled 2021-08-23 (×2): qty 20

## 2021-08-23 MED ORDER — APIXABAN 5 MG PO TABS
5.0000 mg | ORAL_TABLET | Freq: Two times a day (BID) | ORAL | Status: DC
Start: 2021-08-30 — End: 2021-08-27

## 2021-08-23 MED ORDER — MELATONIN 3 MG PO TABS
3.0000 mg | ORAL_TABLET | Freq: Every evening | ORAL | Status: DC | PRN
  Administered 2021-08-26: 3 mg via ORAL
  Filled 2021-08-23: qty 1

## 2021-08-23 MED ORDER — APIXABAN 5 MG PO TABS
10.0000 mg | ORAL_TABLET | Freq: Two times a day (BID) | ORAL | Status: DC
  Administered 2021-08-23 – 2021-08-27 (×9): 10 mg via ORAL
  Filled 2021-08-23 (×9): qty 2

## 2021-08-23 NOTE — TOC Benefit Eligibility Note (Signed)
Patient Advocate Encounter  Insurance verification completed.    The patient is currently admitted and upon discharge could be taking Eliquis 5 mg.  The current 30 day co-pay is, $47.00.   The patient is insured through [employer] Medicare Part D    Lenaya Pietsch Goettel, CPhT Pharmacy Patient Advocate Specialist [facility] Patient Advocate Team Direct Number: [phone]  Fax: [phone]        

## 2021-08-23 NOTE — Discharge Summary (Signed)
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  #2 history of fusion L2-S1.  #3 spondylosis and stenosis L1-L2.  #4 neurogenic claudication.  #5 lumbar radiculopathy. 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  #2 history of fusion L2-S1.  #3 spondylosis and stenosis L1-L2.  #4 neurogenic claudication.  #5 lumbar radiculopathy. ? ?Condition on discharge: Improved ?Major surgery: Decompression via TLIF L1-L2, revision of pseudoarthrosis with fixation to the pelvis using robotic assistance with posterior fixation L1 to the pelvis. ?Complications: None ?Discharge medications: Tramadol, Robaxin. ?

## 2021-08-23 NOTE — Progress Notes (Signed)
eLink Physician-Brief Progress Note ?Patient Name: Christy Stephenson ?DOB: April 18, 1941 ?MRN: [id number] ? ? ?Date of Service ? 08/23/2021  ?HPI/Events of Note ? Patient request sleep aid.   ?eICU Interventions ? Plan: ?Melatonin 3 mg PO Q HS PRN sleep.  ? ? ? ?Intervention Category ?Major Interventions: Other: ? ?Zeah Germano Cornelia Copa ?08/23/2021, 1:44 AM ?

## 2021-08-23 NOTE — Evaluation (Signed)
Occupational Therapy Evaluation ?Patient Details ?Name: Christy Stephenson ?MRN: [id number] ?DOB: 1940/07/22 ?Today's Date: 08/23/2021 ? ? ?History of Present Illness Pt is a 81 y/o female with presented with fever, weakness, dyspnea, and drainage from surgical incision (back). Chest CT negative for PE, Doppler negative for DVT despite elevated d.dimer. Pt admitted to ICU for septic shock and AKI. Recent admission 3/21 for lumbar decompression/fusion. PMH: asthma, prior back surgery. cardiac cath, R TKA.  ? ?Clinical Impression ?  ?PTA, pt lives with spouse, reports Modified Independent with ADLs and mobility since DC home s/p back surgery until onset of symptoms noted above. Pt presents now with deficits noted below (see OT problem list) with noted DOE during activities. Pt requires Min A for bed mobility, min guard for basic transfers with cues for breathing techniques. Pt Setup for UB ADL and Mod A for LB ADLs. Pt reports her family can provide assist as needed at home with [facility] follow-up at DC. Pt called daughter during session to notify need to bring back brace for use during this admission. Plan to reinforce energy conservation strategies and progress ADL/mobility endurance in next sessions.  ? ?SpO2 99-100% on 2 L O2; 3-4/4 DOE ?HR 100-117bpm ?BP stable ?   ? ?Recommendations for follow up therapy are one component of a multi-disciplinary discharge planning process, led by the attending physician.  Recommendations may be updated based on patient status, additional functional criteria and insurance authorization.  ? ?Follow Up Recommendations ? Home health OT  ?  ?Assistance Recommended at Discharge Intermittent Supervision/Assistance  ?Patient can return home with the following A little help with bathing/dressing/bathroom;Assistance with cooking/housework;Assist for transportation;Help with stairs or ramp for entrance ? ?  ?Functional Status Assessment ? Patient has had a recent decline in their functional status and  demonstrates the ability to make significant improvements in function in a reasonable and predictable amount of time.  ?Equipment Recommendations ? None recommended by OT  ?  ?Recommendations for Other Services   ? ? ?  ?Precautions / Restrictions Precautions ?Precautions: Fall;Back ?Precaution Comments: back sx 08/07/21 with LSO brace (did not have brace on eval - family bringing it); watch DOE; bladder urgency ?Required Braces or Orthoses: Spinal Brace ?Spinal Brace: Lumbar corset ?Restrictions ?Weight Bearing Restrictions: No  ? ?  ? ?Mobility Bed Mobility ?Overal bed mobility: Needs Assistance ?Bed Mobility: Rolling, Sidelying to Sit ?Rolling: Min assist ?Sidelying to sit: Min assist ?  ?  ?  ?General bed mobility comments: assist to maintain log rolling and assist to lift trunk due to difficulty getting out on L side of bed ?  ? ?Transfers ?Overall transfer level: Needs assistance ?Equipment used: Rolling walker (2 wheels) ?Transfers: Sit to/from Stand ?Sit to Stand: Min guard ?  ?  ?Step pivot transfers: Min guard ?  ?  ?General transfer comment: minor cues for sequencing ?  ? ?  ?Balance Overall balance assessment: Mild deficits observed, not formally tested ?  ?  ?  ?  ?  ?  ?  ?  ?  ?  ?  ?  ?  ?  ?  ?  ?  ?  ?   ? ?ADL either performed or assessed with clinical judgement  ? ?ADL Overall ADL's : Needs assistance/impaired ?Eating/Feeding: Independent;Sitting ?  ?Grooming: Set up;Standing ?  ?Upper Body Bathing: Sitting;With adaptive equipment;Set up ?  ?Lower Body Bathing: Moderate assistance;Sit to/from stand ?  ?Upper Body Dressing : Set up;Sitting ?  ?Lower Body Dressing: Moderate  assistance;Sit to/from stand ?Lower Body Dressing Details (indicate cue type and reason): assist for socks due to high bed level ?Toilet Transfer: Min guard;Stand-pivot;Rolling walker (2 wheels);BSC/3in1 ?Toilet Transfer Details (indicate cue type and reason): no physical assist needed, minor cueing for sequencing ?Toileting-  Clothing Manipulation and Hygiene: Min guard;Sitting/lateral lean;Sit to/from stand ?  ?  ?  ?Functional mobility during ADLs: Min guard;Rolling walker (2 wheels) ?General ADL Comments: Pt with recent back surgery, able to recall 2/3 precautions. Pt limited by DOE though SpO2 sats WFL on 2 L O2  ? ? ? ?Vision Ability to See in Adequate Light: 0 Adequate ?Patient Visual Report: No change from baseline ?Vision Assessment?: No apparent visual deficits  ?   ?Perception   ?  ?Praxis   ?  ? ?Pertinent Vitals/Pain Pain Assessment ?Pain Assessment: No/denies pain  ? ? ? ?Hand Dominance Right ?  ?Extremity/Trunk Assessment Upper Extremity Assessment ?Upper Extremity Assessment: Overall WFL for tasks assessed ?  ?Lower Extremity Assessment ?Lower Extremity Assessment: Defer to PT evaluation ?  ?Cervical / Trunk Assessment ?Cervical / Trunk Assessment: Normal;Back Surgery ?  ?Communication Communication ?Communication: No difficulties ?  ?Cognition Arousal/Alertness: Awake/alert ?Behavior During Therapy: [facility] for tasks assessed/performed ?Overall Cognitive Status: Within Functional Limits for tasks assessed ?  ?  ?  ?  ?  ?  ?  ?  ?  ?  ?  ?  ?  ?  ?  ?  ?  ?  ?  ?General Comments    ? ?  ?Exercises   ?  ?Shoulder Instructions    ? ? ?Home Living Family/patient expects to be discharged to:: Private residence ?Living Arrangements: Spouse/significant other ?Available Help at Discharge: Family;Available 24 hours/day ?Type of Home: House ?Home Access: Stairs to enter ?Entrance Stairs-Number of Steps: 20 from garage with stair lift ?  ?Home Layout: One level ?  ?  ?Bathroom Shower/Tub: Walk-in shower ?  ?Bathroom Toilet: Standard ?Bathroom Accessibility: Yes ?How Accessible: Accessible via walker ?Home Equipment: Conservation officer, nature (2 wheels);Shower seat;BSC/3in1;Hand held shower head;Adaptive equipment ?Adaptive Equipment: Reacher;Long-handled sponge ?  ?  ? ?  ?Prior Functioning/Environment Prior Level of Function :  Independent/Modified Independent ?  ?  ?  ?  ?  ?  ?Mobility Comments: reports walking with RW initially s/p back surgery but progressed to no longer require it ?ADLs Comments: Reports no difficulty with ADLs s/p back surgery ?  ? ?  ?  ?OT Problem List: Decreased strength;Decreased activity tolerance;Impaired balance (sitting and/or standing);Cardiopulmonary status limiting activity;Decreased knowledge of use of DME or AE;Decreased knowledge of precautions ?  ?   ?OT Treatment/Interventions: Self-care/ADL training;Therapeutic exercise;Energy conservation;DME and/or AE instruction;Therapeutic activities;Patient/family education;Balance training  ?  ?OT Goals(Current goals can be found in the care plan section) Acute Rehab OT Goals ?Patient Stated Goal: recover back to independence ?OT Goal Formulation: With patient ?Time For Goal Achievement: 09/06/21 ?Potential to Achieve Goals: Good  ?OT Frequency: Min 2X/week ?  ? ?Co-evaluation   ?  ?  ?  ?  ? ?  ?AM-PAC OT "6 Clicks" Daily Activity     ?Outcome Measure Help from another person eating meals?: None ?Help from another person taking care of personal grooming?: A Little ?Help from another person toileting, which includes using toliet, bedpan, or urinal?: A Little ?Help from another person bathing (including washing, rinsing, drying)?: A Lot ?Help from another person to put on and taking off regular upper body clothing?: A Little ?Help from another person to put  on and taking off regular lower body clothing?: A Lot ?6 Click Score: 17 ?  ?End of Session Equipment Utilized During Treatment: Rolling walker (2 wheels) ?Nurse Communication: Mobility status ? ?Activity Tolerance: Patient tolerated treatment well ?Patient left: in chair;with call bell/phone within reach ? ?OT Visit Diagnosis: Unsteadiness on feet (R26.81);Other abnormalities of gait and mobility (R26.89);Muscle weakness (generalized) (M62.81)  ?              ?Time: 4584-8350 ?OT Time Calculation (min): 32  min ?Charges:  OT General Charges ?$OT Visit: 1 Visit ?OT Evaluation ?$OT Eval Moderate Complexity: 1 Mod ?OT Treatments ?$Self Care/Home Management : 8-22 mins ? ?Malachy Chamber, OTR/L ?Acute Rehab Services ?Office: [phone]

## 2021-08-23 NOTE — Evaluation (Signed)
Physical Therapy Evaluation ?Patient Details ?Name: Christy Stephenson ?MRN: [id number] ?DOB: 03-10-1941 ?Today's Date: 08/23/2021 ? ?History of Present Illness ? Pt is a 81 y/o female with presented with fever, weakness, dyspnea, and drainage from surgical incision (back). Chest CT negative for PE, Doppler negative for DVT despite elevated d.dimer. Pt admitted to ICU for septic shock and AKI. Recent admission 3/21 for lumbar decompression/fusion. PMH: asthma, prior back surgery. cardiac cath, R TKA.  ?Clinical Impression ? Pt admitted with above diagnosis and presents to PT with functional limitations due to deficits listed below (See PT problem list). Pt needs skilled PT to maximize independence and safety to allow discharge to home with family.  ?   ?   ? ?Recommendations for follow up therapy are one component of a multi-disciplinary discharge planning process, led by the attending physician.  Recommendations may be updated based on patient status, additional functional criteria and insurance authorization. ? ?Follow Up Recommendations Home health PT ? ?  ?Assistance Recommended at Discharge Intermittent Supervision/Assistance  ?Patient can return home with the following ? A little help with walking and/or transfers;Assist for transportation ? ?  ?Equipment Recommendations None recommended by PT  ?Recommendations for Other Services ?    ?  ?Functional Status Assessment Patient has had a recent decline in their functional status and demonstrates the ability to make significant improvements in function in a reasonable and predictable amount of time.  ? ?  ?Precautions / Restrictions Precautions ?Precautions: Fall;Back ?Precaution Comments: back sx 08/07/21 with LSO brace, bladder urgency ?Required Braces or Orthoses: Spinal Brace ?Spinal Brace: Lumbar corset ?Restrictions ?Weight Bearing Restrictions: No  ? ?  ? ?Mobility ? Bed Mobility ?  ?  ?  ?  ?  ?  ?  ?General bed mobility comments: Pt up in chair ?   ? ?Transfers ?Overall transfer level: Needs assistance ?Equipment used: Rolling walker (2 wheels) ?Transfers: Sit to/from Stand, Bed to chair/wheelchair/BSC ?Sit to Stand: Min guard ?  ?Step pivot transfers: Min guard ?  ?  ?  ?General transfer comment: Incr time and effort to stand. Verbal cues for hand placement on sitting. Recliner to bsc with step pivot with walker ?  ? ?Ambulation/Gait ?Ambulation/Gait assistance: Min guard ?Gait Distance (Feet): 110 Feet ?Assistive device: Rolling walker (2 wheels) ?Gait Pattern/deviations: Step-through pattern, Decreased stride length ?Gait velocity: Decreased ?Gait velocity interpretation: 1.31 - 2.62 ft/sec, indicative of limited community ambulator ?  ?General Gait Details: Assist for safety and lines ? ?Stairs ?  ?  ?  ?  ?  ? ?Wheelchair Mobility ?  ? ?Modified Rankin (Stroke Patients Only) ?  ? ?  ? ?Balance Overall balance assessment: Mild deficits observed, not formally tested ?  ?  ?  ?  ?  ?  ?  ?  ?  ?  ?  ?  ?  ?  ?  ?  ?  ?  ?   ? ? ? ?Pertinent Vitals/Pain Pain Assessment ?Pain Assessment: No/denies pain  ? ? ?Home Living Family/patient expects to be discharged to:: Private residence ?Living Arrangements: Spouse/significant other ?Available Help at Discharge: Family;Available 24 hours/day ?Type of Home: House ?Home Access: Stairs to enter ?  ?Entrance Stairs-Number of Steps: 20 from garage with stair lift ?  ?Home Layout: One level ?Home Equipment: Conservation officer, nature (2 wheels);Shower seat;BSC/3in1;Hand held shower head;Adaptive equipment ?   ?  ?Prior Function Prior Level of Function : Independent/Modified Independent ?  ?  ?  ?  ?  ?  ?  Mobility Comments: reports walking with RW initially s/p back surgery but progressed to no longer require it ?ADLs Comments: Reports no difficulty with ADLs s/p back surgery ?  ? ? ?Hand Dominance  ? Dominant Hand: Right ? ?  ?Extremity/Trunk Assessment  ? Upper Extremity Assessment ?Upper Extremity Assessment: Defer to OT  evaluation ?  ? ?Lower Extremity Assessment ?Lower Extremity Assessment: Generalized weakness ?  ? ?Cervical / Trunk Assessment ?Cervical / Trunk Assessment: Back Surgery  ?Communication  ? Communication: No difficulties  ?Cognition Arousal/Alertness: Awake/alert ?Behavior During Therapy: [facility] for tasks assessed/performed ?Overall Cognitive Status: Within Functional Limits for tasks assessed ?  ?  ?  ?  ?  ?  ?  ?  ?  ?  ?  ?  ?  ?  ?  ?  ?  ?  ?  ? ?  ?General Comments General comments (skin integrity, edema, etc.): SpO2 >92% on RA ? ?  ?Exercises    ? ?Assessment/Plan  ?  ?PT Assessment Patient needs continued PT services  ?PT Problem List Decreased strength;Decreased mobility;Decreased activity tolerance ? ?   ?  ?PT Treatment Interventions DME instruction;Gait training;Functional mobility training;Therapeutic activities;Therapeutic exercise;Balance training;Patient/family education   ? ?PT Goals (Current goals can be found in the Care Plan section)  ?Acute Rehab PT Goals ?Patient Stated Goal: return home ?PT Goal Formulation: With patient ?Time For Goal Achievement: 09/06/21 ?Potential to Achieve Goals: Good ? ?  ?Frequency Min 3X/week ?  ? ? ?Co-evaluation   ?  ?  ?  ?  ? ? ?  ?AM-PAC PT "6 Clicks" Mobility  ?Outcome Measure Help needed turning from your back to your side while in a flat bed without using bedrails?: A Little ?Help needed moving from lying on your back to sitting on the side of a flat bed without using bedrails?: A Little ?Help needed moving to and from a bed to a chair (including a wheelchair)?: A Little ?Help needed standing up from a chair using your arms (e.g., wheelchair or bedside chair)?: A Little ?Help needed to walk in hospital room?: A Little ?Help needed climbing 3-5 steps with a railing? : A Little ?6 Click Score: 18 ? ?  ?End of Session Equipment Utilized During Treatment: Back brace ?Activity Tolerance: Patient tolerated treatment well ?Patient left: in chair;with call bell/phone  within reach;with family/visitor present ?Nurse Communication: Mobility status ?PT Visit Diagnosis: Other abnormalities of gait and mobility (R26.89) ?  ? ?Time: 4709-6283 ?PT Time Calculation (min) (ACUTE ONLY): 30 min ? ? ?Charges:   PT Evaluation ?$PT Eval Moderate Complexity: 1 Mod ?PT Treatments ?$Gait Training: 8-22 mins ?  ?   ? ? ?[facility] PT ?Acute Rehabilitation Services ?Pager [phone] ?Office [phone] ? ? ?Shary Decamp [facility] ?08/23/2021, 1:50 PM ? ?

## 2021-08-23 NOTE — Progress Notes (Signed)
? ?NAME:  Christy Stephenson, MRN:  [id number], DOB:  12/30/1940, LOS: 2 ?ADMISSION DATE:  08/21/2021, CONSULTATION DATE:  08/21/21 ?REFERRING MD:  [id number] CHIEF COMPLAINT:  Weakness  ? ?History of Present Illness:  ?Christy Stephenson is a 81 y.o. female who has a PMH as below.  She had recent admission to our hospital 08/07/21 for decompression of L1-L2 with interbody spacer, pedicle fixation L1-S2, and arthrodesis L4 to sacrum. ? ?She did well post op up until around 4/2 when she began to have generalized weakness and fever.  Symptoms progressed and 4/4, she started having dyspnea, N/V/D as well as increased drainage from incision site, non-purulent. ? ?In ED, [place] [address], lactate 4.9, SCr 2.8, WBC 5.6. She was also hypotensive to the 60s and hypoxic requiring [place].  She was placed on o2 and was started on Norepinephrine. ? ?She was unable to get CTA chest given AKI; however, there was strong concern for PE.  She was started on Heparin and was transferred to the ICU at [facility] for ongoing management. ? ? ?Pertinent  Medical History:  ?has IDIOPATHIC PROGRESSIVE POLYNEUROPATHY; Asthma with bronchitis; Obstructive sleep apnea; MEMORY LOSS; OTHER CHEST PAIN; ELECTROCARDIOGRAM, ABNORMAL; Dyspnea; Abnormal finding on cardiovascular stress test; Transient global amnesia; Granulomatous lung disease (HCC); OA (osteoarthritis) of knee; Primary osteoarthritis of right knee; Pseudoarthrosis of lumbar spine; Shock ([place]); Acute respiratory failure with hypoxia ([place]); AKI (acute kidney injury) ([place]); Nausea; Septic shock ([place]); Bacteremia; and Hyponatremia on their problem list. ? ?Significant Hospital Events: ?Including procedures, antibiotic start and stop dates in addition to other pertinent events   ?4/4 admit to [facility]; started on heparin gtt for possible PE; V/Q w/ possible ?4/5: remains on levo ?4/6: off levo ? ?Interim History / Subjective:  ?Off levo ?On 2 L [place] up in chair ? ?Objective:  ?Blood pressure 126/80, pulse  99, temperature 98.1 ?F (36.7 ?C), temperature source Oral, resp. rate 14, height $RemoveBe'5\' 4"'yayVGPSOV$  (1.626 m), weight 111.5 kg, SpO2 99 %. ?   ?   ? ?Intake/Output Summary (Last 24 hours) at 08/23/2021 0724 ?Last data filed at 08/23/2021 0600 ?Gross per 24 hour  ?Intake 1927.17 ml  ?Output 2000 ml  ?Net -72.83 ml  ? ? ?Filed Weights  ? 08/21/21 0602 08/22/21 0500 08/23/21 0700  ?Weight: 107.9 kg 112.9 kg 111.5 kg  ? ? ?Examination: ?General:   elderly female in NAD up in chair ?HEENT: MM pink/moist; [place] in place ?Neuro: Aox3; MAE ?CV: s1s2, no m/r/g ?PULM:  dim clear BS bilaterally; [place] 2L  ?GI: soft, bsx4 active  ?Extremities: warm/dry, BLE trace edema  ?Skin: no rashes or lesions appreciated; back incision w/ dressing in place with some drainage ? ? ?Imaging:  ?Echo 4/4 > LVEF 30-35%; mod decreased fx; global hypokinesis; mild LVH; grade I diastolic dysfunction; RV fx mildly reduced; RV mildly enlarged; mod elevated PA systolic pressure. ? ?LE duplex 4/4 > no dvt ? ?V/Q 4/4>mod decreased perfusion in posterior L mid and LLL; inclonclusive for PE ? ?RUQ [place] 4/4>mild dilatation of common bile duct post cholecystectomy. Consider MRCP/MRI vs CT ? ?Assessment & Plan:  ? ?Concern for possible PE: elevated D-dimer, post surgery, fever, dyspnea; DVT US negative; V/Q inconclusive for PE but mod decreased perfusion in posterior L mid and LLL  ?Elevated Troponin: NSTEMI vs. Demand ischemia ?P: ?-cont heparin gtt; consider switching to doac ? ?Shock - unclear etiology at this point but could be 2/2 obstructive in setting PE vs septic of unclear  source.  Did have recent back surgery; however, fluid drainage appears clear and she has no leukocytosis. ?Gram negative bacteremia: bc positive from [facility] ?P: ?-off levo; map goal >65 ?-heparin gtt ?-cont cefepime; will reach out to [facility] to see if bcx2 grew any sensitivities ?-f/u UC ? ?Acute respiratory failure with hypoxia ?Asthma w/ bronchitis: takes trelegy at home ?OSA: on cpap at  bedtime ?P: ?-continue to wean [place] for sats >92% ?-bipap qhs and prn ?-breo ellipta and incruse ellipta  ?-prn albuterol for wheezing ? 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 ?P: ?-strict I/O's; daily weights ?-unable to start BB with soft BP ?-will need further f/u outpatient  ? ?AKI: creat trending down ?Metabolic acidosis: improving ?P: ?-Trend BMP / urinary output ?-Replace electrolytes as indicated ?-Avoid nephrotoxic agents, ensure adequate renal perfusion ? ?Hyponatremia: improved; likely hypovolemia ?P: ?-trend bmp ? ?Nausea ?P: ?- Zofran and phenergan PRN ? ?Recent back surgery 08/07/21 with mild clear drainage - decompression of L1-L2 with interbody spacer, pedicle fixation L1-S2, and arthrodesis L4 to sacrum. ?P: ?-NSG consulted; appreciate recs ?-cont abx as above ? ?Elevated [place] ?Hyperbilirubinemia ?P: ?-RUQ u/s no acute findings ?-consider ct abd ? ?Subclinical hyperthyroidism: TSH low normal T4 ?P: ?-will need further f/u outpatient ? ?Idiopathic progressive polyneuropathy  ?P: ?-resume home lyrica ? ?Best practice (evaluated daily):  ?Diet/type: Regular consistency (see orders) ?DVT prophylaxis: systemic heparin ?GI prophylaxis: PPI ?Lines: Central line ?Foley:  N/A ?Code Status:  full code ?Last date of multidisciplinary goals of care discussion: 4/6 spoke with patient at bedside and updated ?  ? ?  ? ?Mikki Harbor, PA-C ?[facility] ?08/23/2021, 7:24 AM ? ?Please see [email or web address] for pager details. ? ?From 7A-7P if no response, please call [phone]. ?After hours, please call Warren Lacy [phone]. ? ? ? ? ?

## 2021-08-24 DIAGNOSIS — N179 Acute kidney failure, unspecified: Secondary | ICD-10-CM | POA: Diagnosis not present

## 2021-08-24 DIAGNOSIS — E871 Hypo-osmolality and hyponatremia: Secondary | ICD-10-CM | POA: Diagnosis not present

## 2021-08-24 DIAGNOSIS — R7881 Bacteremia: Secondary | ICD-10-CM | POA: Diagnosis not present

## 2021-08-24 DIAGNOSIS — A419 Sepsis, unspecified organism: Secondary | ICD-10-CM | POA: Diagnosis not present

## 2021-08-24 DIAGNOSIS — I5041 Acute combined systolic (congestive) and diastolic (congestive) heart failure: Secondary | ICD-10-CM | POA: Diagnosis not present

## 2021-08-24 DIAGNOSIS — J9601 Acute respiratory failure with hypoxia: Secondary | ICD-10-CM | POA: Diagnosis not present

## 2021-08-24 DIAGNOSIS — R6521 Severe sepsis with septic shock: Secondary | ICD-10-CM | POA: Diagnosis not present

## 2021-08-24 DIAGNOSIS — R579 Shock, unspecified: Secondary | ICD-10-CM | POA: Diagnosis not present

## 2021-08-24 LAB — CBC
HCT: 25.7 % — ABNORMAL LOW (ref 36.0–46.0)
Hemoglobin: 8.7 g/dL — ABNORMAL LOW (ref 12.0–15.0)
MCH: 31 pg (ref 26.0–34.0)
MCHC: 33.9 g/dL (ref 30.0–36.0)
MCV: 91.5 fL (ref 80.0–100.0)
Platelets: 46 10*3/uL — ABNORMAL LOW (ref 150–400)
RBC: 2.81 MIL/uL — ABNORMAL LOW (ref 3.87–5.11)
RDW: 14.8 % (ref 11.5–15.5)
WBC: 9.1 10*3/uL (ref 4.0–10.5)
nRBC: 0 % (ref 0.0–0.2)

## 2021-08-24 LAB — COMPREHENSIVE METABOLIC PANEL
ALT: 39 U/L (ref 0–44)
AST: 26 U/L (ref 15–41)
Albumin: 1.6 g/dL — ABNORMAL LOW (ref 3.5–5.0)
Alkaline Phosphatase: 264 U/L — ABNORMAL HIGH (ref 38–126)
Anion gap: 7 (ref 5–15)
BUN: 35 mg/dL — ABNORMAL HIGH (ref 8–23)
CO2: 27 mmol/L (ref 22–32)
Calcium: 7.9 mg/dL — ABNORMAL LOW (ref 8.9–10.3)
Chloride: 105 mmol/L (ref 98–111)
Creatinine, Ser: 1.53 mg/dL — ABNORMAL HIGH (ref 0.44–1.00)
GFR, Estimated: 34 mL/min — ABNORMAL LOW (ref >60)
Glucose, Bld: 100 mg/dL — ABNORMAL HIGH (ref 70–99)
Potassium: 3.4 mmol/L — ABNORMAL LOW (ref 3.5–5.1)
Sodium: 139 mmol/L (ref 135–145)
Total Bilirubin: 1.3 mg/dL — ABNORMAL HIGH (ref 0.3–1.2)
Total Protein: 4.4 g/dL — ABNORMAL LOW (ref 6.5–8.1)

## 2021-08-24 MED ORDER — METOPROLOL TARTRATE 12.5 MG HALF TABLET
12.5000 mg | ORAL_TABLET | Freq: Two times a day (BID) | ORAL | Status: DC
  Administered 2021-08-24 – 2021-08-25 (×4): 12.5 mg via ORAL
  Filled 2021-08-24 (×4): qty 1

## 2021-08-24 MED ORDER — POTASSIUM CHLORIDE CRYS ER 20 MEQ PO TBCR
40.0000 meq | EXTENDED_RELEASE_TABLET | Freq: Once | ORAL | Status: AC
  Administered 2021-08-24: 40 meq via ORAL
  Filled 2021-08-24: qty 2

## 2021-08-24 MED ORDER — FUROSEMIDE 10 MG/ML IJ SOLN
40.0000 mg | Freq: Once | INTRAMUSCULAR | Status: AC
  Administered 2021-08-24: 40 mg via INTRAVENOUS
  Filled 2021-08-24: qty 4

## 2021-08-24 NOTE — Consult Note (Addendum)
?Cardiology Consultation:  ? ?Patient ID: Christy Stephenson ?MRN: [id number]; DOB: 01-30-1941 ? ?Admit date: 08/21/2021 ?Date of Consult: 08/24/2021 ? ?PCP:  Seward Carol, MD ?  ?[facility] Providers ?Cardiologist:  Dr. Gwenlyn Found ([place]) ? ? ?Patient Profile:  ? ?Christy Stephenson is a 81 y.o. female with a hx of TIA in 2014, asthma, obstructive sleep apnea on CPAP, idiopathic progressive polyneuropathy, memory loss and morbid obesity who is being seen 08/24/2021 for the evaluation of low EF at the request of Dr. Lonny Prude. ? ?Patient with history of false positive stress test in 2014.  Cardiac catheterization showed normal coronaries. 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 ? ?History of Present Illness:  ? ?Christy Stephenson admitted last month for decompression of L1-L2 with interbody spacer, pedicle fixation L1-S2, and arthrodesis L4 to sacrum.  She was recovering well but started to having dyspnea, vomiting and increased drainage from incision site.  She was slipped to the floor and unable to get up.  Presented as transfer from [facility] hypotensive, tachycardic, elevated D-dimer, tachycardia. She was admitted for septic shock secondary to E. coli bacteremia (presumed urinary source).  Treated with broad-spectrum antibiotic.  VQ scan showing probable pulmonary embolism.  She was treated with heparin and transition to Eliquis.  No DVT on lower extremity Doppler.  Troponin peaked at 3397 and trending down.  Renal function, LFTs and hyperbilirubinemia are improving.  Anemia felt due to postsurgery blood loss. ? ?Echocardiogram showed low LV function at 30 to 35% and grade 1 diastolic dysfunction.  Global hypokinesis.  Mild LVH.  Mildly reduced RV function.  Moderately elevated pulmonary artery systolic pressure.  Mild to moderate mitral regurgitation.  Mild to moderate TR. ? ? ?At baseline, patient has chronic dyspnea due to severe asthma  which is exacerbated by COVID in November 2022.  She never had any chest pain.  No regular exercise.  She was able to do household chores without any problem.  Denies palpitation, dizziness, orthopnea or syncope. ? ?Past Medical History:  ?Diagnosis Date  ? Arthritis   ? osteo  ? Asthma   ? Asthma   ? Basal cell carcinoma   ? face  ? COPD (chronic obstructive pulmonary disease) (Stephenson)   ? GERD (gastroesophageal reflux disease)   ? Heart palpitations   ? History of hiatal hernia 1991  ? History of iron deficiency anemia   ? History of migraine   ? while taking birth control pills  ? Hypercholesteremia   ? Neuropathy   ? left greater than right feet  ? Obesity   ? Obstructive sleep apnea 07/09/2012  ? CPAP   ? Peripheral neuropathy   ? Pneumonia   ? as a child  ? PONV (postoperative nausea and vomiting)   ? not since 1967  ? Shortness of breath   ? with exertion  ? Stroke [facility])   ? TIA (transient ischemic attack) 2014  ? TIA (transient ischemic attack) 2014  ? no residual - no follow up needed  ? 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 ? ?Past Surgical History:  ?Procedure Laterality Date  ? APPLICATION OF ROBOTIC ASSISTANCE FOR SPINAL PROCEDURE N/A 08/07/2021  ? Procedure: APPLICATION OF ROBOTIC ASSISTANCE FOR SPINAL PROCEDURE;  Surgeon: Kristeen Miss, MD;  Location: [place];  Service: Neurosurgery;  Laterality: N/A;  ? BREAST BIOPSY Right   ? BREAST CYST EXCISION Left   ? CARDIAC CATHETERIZATION  07/09/2012  ? cataracts Bilateral 2013  ? this was second one  ? CHOLECYSTECTOMY N/A 02/18/2017  ? Procedure: LAPAROSCOPIC CHOLECYSTECTOMY WITH INTRAOPERATIVE CHOLANGIOGRAM;  Surgeon: Erroll Luna, MD;  Location: [place];  Service: General;  Laterality: N/A;  ? COLONOSCOPY    ? DILATION AND CURETTAGE OF UTERUS    ? EYE SURGERY Bilateral 2014  ? cataract removal 2013 and 2014  ? FINGER SURGERY Right 1971  ? ring finger repair  ? KNEE SURGERY Right   ? arthroscopy  ? LEFT HEART CATHETERIZATION WITH CORONARY ANGIOGRAM N/A  07/09/2012  ? Procedure: LEFT HEART CATHETERIZATION WITH CORONARY ANGIOGRAM;  Surgeon: Sinclair Grooms, MD;  Location: [facility] CATH LAB;  Service: Cardiovascular;  Laterality: N/A;  ? LUMBAR FUSION    ? L3-S1  ? TONSILLECTOMY    ? TOTAL KNEE ARTHROPLASTY Right 12/06/2019  ? Procedure: TOTAL KNEE ARTHROPLASTY;  Surgeon: Gaynelle Arabian, MD;  Location: [facility];  Service: Orthopedics;  Laterality: Right;  19mn  ? TUBAL LIGATION    ?  ? ?Inpatient Medications: ?Scheduled Meds: ? apixaban  10 mg Oral BID  ? Followed by  ? [START ON 08/30/2021] apixaban  5 mg Oral BID  ? aspirin  81 mg Oral Daily  ? Chlorhexidine Gluconate Cloth  6 each Topical Q0600  ? fluticasone furoate-vilanterol  1 puff Inhalation Daily  ? And  ? umeclidinium bromide  1 puff Inhalation Daily  ? metoprolol tartrate  12.5 mg Oral BID  ? pantoprazole  40 mg Oral Q1200  ? pregabalin  100 mg Oral q morning  ? pregabalin  200 mg Oral QPM  ? sodium chloride flush  10-40 mL Intracatheter Q12H  ? ?Continuous Infusions: ? sodium chloride Stopped (08/21/21 0737)  ? cefTRIAXone (ROCEPHIN)  IV 2 g (08/24/21 02505  ? ?PRN Meds: ?acetaminophen, albuterol, docusate sodium, melatonin, ondansetron (ZOFRAN) IV, oxyCODONE, polyethylene glycol, sodium chloride flush, white petrolatum ? ?Allergies:    ?Allergies  ?Allergen Reactions  ? Sulfa Antibiotics Other (See Comments) and Palpitations  ? Actifed [Triprolidine-Pse] Palpitations  ? Lipitor [Atorvastatin] Other (See Comments)  ?  Aches and pains  ? Bupropion Hcl Itching and Rash  ? ? ?Social History:   ?Social History  ? ?Socioeconomic History  ? Marital status: Married  ?  Spouse name: Christy Stephenson ? Number of children: 2  ? Years of education: AD  ? Highest education level: Not on file  ?Occupational History  ? Occupation: retired  ?  Employer: [employer]  ?  Comment: AD Nursing  ?Tobacco Use  ? Smoking status: Never  ? Smokeless tobacco: Never  ? Tobacco comments:  ?  Had tried when younger  ?Vaping Use  ? Vaping Use:  Never used  ?Substance and Sexual Activity  ? Alcohol use: No  ? Drug use: No  ? Sexual activity: Yes  ?  Birth control/protection: Post-menopausal  ?Other Topics Concern  ? Not on file  ?Social History Narrative  ? Not on file  ? ?Social Determinants of Health  ? ?Financial Resource Strain: Not on file  ?Food Insecurity: Not on file  ?Transportation Needs: Not on file  ?Physical Activity: Not on file  ?Stress: Not on file  ?Social Connections: Not on file  ?Intimate Partner Violence: Not on file  ?  ?Family History:   ?Family History  ?Problem Relation Age of Onset  ? Diabetes Mellitus II Mother   ? CVA Father   ? Heart attack Father   ? Lung cancer Father   ?  Tuberculosis Sister   ?     bovine  ?  ? ?ROS:  ?Please see the history of present illness.  ?All other ROS reviewed and negative.    ? ?Physical Exam/Data:  ? ?Vitals:  ? 08/24/21 0202 08/24/21 0315 08/24/21 2202 08/24/21 5427  ?BP:  119/62 123/63   ?Pulse: 92 98 98   ?Resp: '15 19 17   '$ ?Temp:  98.9 ?F (37.2 ?C) 99.8 ?F (37.7 ?C)   ?TempSrc:  Oral    ?SpO2: 98% 96% 98% 97%  ?Weight:  109.2 kg    ?Height:      ? ? ?Intake/Output Summary (Last 24 hours) at 08/24/2021 1036 ?Last data filed at 08/24/2021 1000 ?Gross per 24 hour  ?Intake 801.37 ml  ?Output 2452 ml  ?Net -1650.63 ml  ? ? ?  08/24/2021  ?  3:15 AM 08/23/2021  ?  2:37 PM 08/23/2021  ?  7:00 AM  ?Last 3 Weights  ?Weight (lbs) 240 lb 11.9 oz 240 lb 1.3 oz 245 lb 13 oz  ?Weight (kg) 109.2 kg 108.9 kg 111.5 kg  ?   ?Body mass index is 40.69 kg/m?.  ?General:  Well nourished, well developed, in no acute distress ?HEENT: normal ?Neck: no JVD ?Vascular: No carotid bruits; Distal pulses 2+ bilaterally ?Cardiac:  normal S1, S2; RRR; + murmur  ?Lungs:  clear to auscultation bilaterally, no wheezing, rhonchi or rales  ?Abd: soft, nontender, no hepatomegaly  ?Ext: 1+ BL LE edema ?Musculoskeletal:  No deformities, BUE and BLE strength normal and equal ?Skin: warm and dry  ?Neuro:  CNs 2-12 intact, no focal abnormalities  noted ?Psych:  Normal affect  ? ?EKG:  The EKG was personally reviewed and demonstrates: Sinus rhythm at rate of 81 bpm ?Telemetry:  Telemetry was personally reviewed and demonstrates: Sinus tachycardia ? ?Rel

## 2021-08-24 NOTE — Progress Notes (Signed)
? ?PROGRESS NOTE ? ? ? ?Christy Stephenson  DJS:[id number] DOB: [birth year] DOA: 08/21/2021 ?PCP: Seward Carol, MD ? ? ?Brief Narrative: ? ?Christy Stephenson is a 81 y.o. female with a history of idiopathic progressive polyneuropathy, asthma, OSA on CPAP, memory loss, asthma. Patient presented from an outside hospital for septic shock. She required admission to the ICU for vasopressor support. Managed on empiric Cefepime > Ceftriaxone. Associated AKI and elevated troponin. Blood cultures significant for E. Coli, presumably from urinary source.  ? ?ICU Course: ?4/4 admit to [facility]; started on heparin gtt for possible PE; V/Q w/ possible ?4/5: remains on levo ?4/6: off levo ? ?Assessment and Plan: ? ?Septic shock secondary to E. Coli bacteremia ?Presumed secondary to urinary source. Patient required vasopressor support which has been weaned off. Empiric Cefepime initiated and was transitioned to Ceftriaxone. BCID on outside hospital records indicate E. Coli infection. Discussed with outside hospital, who will fax over updated culture data as this is not viewable via Care Everywhere ?-Continue Ceftriaxone pending blood culture results via fax ? ?Acute respiratory failure with hypoxia ?In setting of likely PE. Patient is now on room air. ? ?Probable acute PE ?V/Q inconclusive but significant for moderate decreased perfusion in posterior left mid/lower lobes. Provoked in setting of recent surgery. Transthoracic Echocardiogram significant for mildly reduced RV function with mild enlargement. Associated elevated troponin as mentioned below. Started on Heparin IV and transitioned to Eliquis on 4/6. ?-Continue Eliquis for now ? ?Acute combined systolic and diastolic heart failure ?Appears to be a new diagnosis. Prior cardiac catheterization from 2014 without ischemic disease. Transthoracic Echocardiogram this admission shows EF of 30-35% with grade 1 diastolic dysfunction and LV global hypokinesis. Complicated by elevated troponin as  mentioned below. Currently without symptoms of chest pain. ?-Cardiology consult for consideration of inpatient ischemic workup ? ?Elevated troponin ?In setting of critical illness; acute heart failure in setting of sepsis. Troponin trend of 1,661 > 3,397 > 3,185. No chest pain. EKG on admission without obvious evidence of ischemia. Transthoracic Echocardiogram results as mentioned above. ? ?AKI ?Baseline creatinine of about 1. Creatinine on admission of 2.78. Likely secondary to above infection/sepsis. Improving. ? ?Acute anemia ?Baseline hemoglobin of about 14. Anemia appears to be secondary to prior spine surgery. Hemoglobin currently stable. ? ?Hyponatremia ?Mild. Resolved. ? ?Hyperbilirubinemia ?Elevated AST/ALT/ALP ?Unsure of etiology but possibly related to sepsis with shock. History of cholecystectomy. RUQ shows biliary duct dilation. ?-MRCP ? ?Subclinical hypothyroidism ?Low TSH of 0.298 with normal free T4 of 1.08. Recommend repeat TSH/free T4 as an outpatient ? ?History of recent back surgery ?Neurosurgery consulted and evaluated. Per their assessment, bacteremia is unlikely from surgical wound. ? ?Asthma ?-Continue Breo Ellipta and Incruse Ellipta ? ?Idiopathic progressive polyneuropathy ?-Continue Lyrica ? ?OSA ?-Continue CPAP ? ?DVT prophylaxis: Eliquis ?Code Status:   Code Status: Full Code ?Family Communication: Husband and friend at bedside ?Disposition Plan: Discharge home with home health PT/OT likely in 1-2 days pending cardiology recommendations, MRCP results, culture data with subsequent transition to oral antibiotics as able and continued improvement of AKI ? ? ?Consultants:  ?PCCM ?Neurosurgery ?Cardiology ? ?Procedures:  ?TRANSTHORACIC ECHOCARDIOGRAM  ? ?Antimicrobials: ?Cefepime ?Ceftriaxone  ? ? ?Subjective: ?Patient reports no issues overnight. No RUQ pain. No nausea or vomiting. ? ?Objective: ?BP 119/62   Pulse 98   Temp 98.9 ?F (37.2 ?C) (Oral)   Resp 19   Ht 5' 4.5" (1.638 m)   Wt  109.2 kg   SpO2 96%   BMI 40.69 kg/m?  ? ?  Examination: ? ?General exam: Appears calm and comfortable ?Respiratory system: Clear to auscultation. Respiratory effort normal. ?Cardiovascular system: S1 & S2 heard, RRR. No murmurs, rubs, gallops or clicks. ?Gastrointestinal system: Abdomen is nondistended, soft and nontender. No organomegaly or masses felt. Normal bowel sounds heard. ?Central nervous system: Alert and oriented. No focal neurological deficits. ?Musculoskeletal: Trace/1+ BLE pitting edema. No calf tenderness ?Skin: No cyanosis. No rashes ?Psychiatry: Judgement and insight appear normal. Mood & affect appropriate.  ? ? ?Data Reviewed: I have personally reviewed following labs and imaging studies ? ?CBC ?Lab Results  ?Component Value Date  ? WBC 9.1 08/24/2021  ? RBC 2.81 (L) 08/24/2021  ? HGB 8.7 (L) 08/24/2021  ? HCT 25.7 (L) 08/24/2021  ? MCV 91.5 08/24/2021  ? MCH 31.0 08/24/2021  ? PLT 46 (L) 08/24/2021  ? MCHC 33.9 08/24/2021  ? RDW 14.8 08/24/2021  ? LYMPHSABS 1.3 05/07/2021  ? MONOABS 0.5 05/07/2021  ? EOSABS 0.3 05/07/2021  ? BASOSABS 0.0 05/07/2021  ? ? ? ?Last metabolic panel ?Lab Results  ?Component Value Date  ? NA 139 08/24/2021  ? K 3.4 (L) 08/24/2021  ? CL 105 08/24/2021  ? CO2 27 08/24/2021  ? BUN 35 (H) 08/24/2021  ? CREATININE 1.53 (H) 08/24/2021  ? GLUCOSE 100 (H) 08/24/2021  ? GFRNONAA 34 (L) 08/24/2021  ? GFRAA >60 12/07/2019  ? CALCIUM 7.9 (L) 08/24/2021  ? PHOS 4.0 08/22/2021  ? PROT 4.4 (L) 08/24/2021  ? ALBUMIN 1.6 (L) 08/24/2021  ? BILITOT 1.3 (H) 08/24/2021  ? ALKPHOS 264 (H) 08/24/2021  ? AST 26 08/24/2021  ? ALT 39 08/24/2021  ? ANIONGAP 7 08/24/2021  ? ? ?GFR: ?Estimated Creatinine Clearance: 36.3 mL/min (A) (by C-G formula based on SCr of 1.53 mg/dL (H)). ? ?Recent Results (from the past 240 hour(s))  ?MRSA Next Gen by PCR, Nasal     Status: None  ? Collection Time: 08/21/21  6:09 AM  ? Specimen: Nasal Mucosa; Nasal Swab  ?Result Value Ref Range Status  ? MRSA by PCR Next  Gen NOT DETECTED NOT DETECTED Final  ?  Comment: (NOTE) ?The GeneXpert MRSA Assay (FDA approved for NASAL specimens only), ?is one component of a comprehensive MRSA colonization surveillance ?program. It is not intended to diagnose MRSA infection nor to guide ?or monitor treatment for MRSA infections. ?Test performance is not FDA approved in patients less than 2 years ?old. ?Performed at [facility], [place] [address], [place], [place] ?[address] ?  ?Culture, blood (routine x 2)     Status: None (Preliminary result)  ? Collection Time: 08/21/21 10:16 AM  ? Specimen: BLOOD  ?Result Value Ref Range Status  ? Specimen Description BLOOD BLOOD LEFT FOREARM  Final  ? Special Requests   Final  ?  BOTTLES DRAWN AEROBIC ONLY Blood Culture results may not be optimal due to an inadequate volume of blood received in culture bottles  ? Culture   Final  ?  NO GROWTH 2 DAYS ?Performed at [facility], [place] [address] [place],  [address] ?  ? Report Status PENDING  Incomplete  ?Culture, blood (routine x 2)     Status: None (Preliminary result)  ? Collection Time: 08/21/21 10:18 AM  ? Specimen: BLOOD  ?Result Value Ref Range Status  ? Specimen Description BLOOD BLOOD RIGHT HAND  Final  ? Special Requests   Final  ?  BOTTLES DRAWN AEROBIC ONLY Blood Culture results may not be optimal due to an inadequate  volume of blood received in culture bottles  ? Culture   Final  ?  NO GROWTH 2 DAYS ?Performed at [facility], [place] [address] [place], [place] [address] ?  ? Report Status PENDING  Incomplete  ?Urine Culture     Status: Abnormal  ? Collection Time: 08/21/21  3:04 PM  ? Specimen: Urine, Clean Catch  ?Result Value Ref Range Status  ? Specimen Description URINE, CLEAN CATCH  Final  ? Special Requests   Final  ?  NONE ?Performed at [facility], [place] [address], [place], [place] [address] ?  ? Culture MULTIPLE SPECIES PRESENT, SUGGEST RECOLLECTION (A)  Final  ? Report Status 08/23/2021 FINAL  Final  ?   ? ? ?Radiology Studies: ?No results found. ? ? ? LOS: 3 days  ? ? ?Cordelia Poche, MD ?[facility] ?08/24/2021, 6:17 AM ? ? ?If 7PM-7AM, please contact night-coverage ?[email or web address] ? ?

## 2021-08-24 NOTE — Discharge Instructions (Addendum)
Information on my medicine - ELIQUIS? (apixaban) ? ?This medication education was reviewed with me or my healthcare representative as part of my discharge preparation.  The pharmacist that spoke with me during my hospital stay was:  ?Tad Moore, [facility] ? ?Why was Eliquis? prescribed for you? ?Eliquis? was prescribed to treat blood clots that may have been found in the veins of your legs (deep vein thrombosis) or in your lungs (pulmonary embolism) and to reduce the risk of them occurring again. ? ?What do You need to know about Eliquis? ? ?The starting dose is 10 mg (two 5 mg tablets) taken TWICE daily for the FIRST SEVEN (7) DAYS, then 08/30/21 the dose is reduced to ONE 5 mg tablet taken TWICE daily.  Eliquis? may be taken with or without food.  ? ?Try to take the dose about the same time in the morning and in the evening. If you have difficulty swallowing the tablet whole please discuss with your pharmacist how to take the medication safely. ? ?Take Eliquis? exactly as prescribed and DO NOT stop taking Eliquis? without talking to the doctor who prescribed the medication.  Stopping may increase your risk of developing a new blood clot.  Refill your prescription before you run out. ? ?After discharge, you should have regular check-up appointments with your healthcare provider that is prescribing your Eliquis?. ?   ?What do you do if you miss a dose? ?If a dose of ELIQUIS? is not taken at the scheduled time, take it as soon as possible on the same day and twice-daily administration should be resumed. The dose should not be doubled to make up for a missed dose. ? ?Important Safety Information ?A possible side effect of Eliquis? is bleeding. You should call your healthcare provider right away if you experience any of the following: ?Bleeding from an injury or your nose that does not stop. ?Unusual colored urine (red or dark brown) or unusual colored stools (red or black). ?Unusual bruising for unknown reasons. ?A  serious fall or if you hit your head (even if there is no bleeding). ? ?Some medicines may interact with Eliquis? and might increase your risk of bleeding or clotting while on Eliquis?Marland Kitchen To help avoid this, consult your healthcare provider or pharmacist prior to using any new prescription or non-prescription medications, including herbals, vitamins, non-steroidal anti-inflammatory drugs (NSAIDs) and supplements. ? ?This website has more information on Eliquis? (apixaban): http://www.eliquis.com/eliquis/home  ?

## 2021-08-24 NOTE — Consult Note (Signed)
? ?  [facility] CM Inpatient Consult ? ? ?08/24/2021 ? ?Christy Stephenson ?18-Dec-1940 ?[id number] ? ?[facility] [ACO] Patient: Medicare ACO Reach ? ?Primary Care Provider: Seward Carol, MD, [facility] Physician at [facility] Arabian is an Embedded provider listed with a Chronic Care Management program and team  ? ?Patient screened for less than 30 days re-admission hospitalization with noted medium high risk score for unplanned readmission risk and  to assess for potential Embedded Chronic Care Management service needs for post hospital transition. Patient discussed in unit progression meeting for post hospital needs.  Reviewed encounters and patient seems to have activity with CCM team. Patient is currently working with nurse for more testing noted on floor rounds. ? ?Plan:  Continue to follow progress if CCM is active for chronic care management then post hospital needs will be followed by CCM. ? ?For questions contact:  ? ?Natividad Brood, RN BSN CCM ?[facility] Liaison ? [phone] business mobile phone ?Toll free office [phone]  ?Fax number: [phone] ?[email or web address]  ? ? ? ?

## 2021-08-24 NOTE — Progress Notes (Signed)
Called MRI for update on when patient might be having her MRI. They stated there are several stat ones ahead of her and to let patient eat. She will go back to being NPO after dinner. ?

## 2021-08-24 NOTE — Hospital Course (Signed)
Christy Stephenson is a 81 y.o. female with a history of idiopathic progressive polyneuropathy, asthma, OSA on CPAP, memory loss, asthma. Patient presented from an outside hospital for septic shock. She required admission to the ICU for vasopressor support. Managed on empiric Cefepime > Ceftriaxone. Associated AKI and elevated troponin. Blood cultures significant for E. Coli, presumably from urinary source.  ?

## 2021-08-24 NOTE — Progress Notes (Signed)
Physical Therapy Treatment ?Patient Details ?Name: Christy Stephenson ?MRN: [id number] ?DOB: [birth year] ?Today's Date: 08/24/2021 ? ? ?History of Present Illness Pt is a 81 y/o female with presented with fever, weakness, dyspnea, and drainage from surgical incision (back). Chest CT negative for PE, Doppler negative for DVT despite elevated d.dimer. Pt admitted to ICU for septic shock and AKI. Recent admission 3/21 for lumbar decompression/fusion. PMH: asthma, prior back surgery. cardiac cath, R TKA. ? ? ?  ?PT Comments  ? ? The pt was able to demo good progress with hallway ambulation, but after returning to room, had bilateral knee buckling x3 needing modA to steady and return to seated position in the recliner. BP taken after sitting in recliner 80/58 (62) with pt reporting only weakness in LE. The pt will continue to benefit from skilled PT to progress mobility and activity tolerance prior to return home. Pt and family voiced understanding of monitoring BP with activity and progressing back to prior levels of activity gradually.  ?  ?Recommendations for follow up therapy are one component of a multi-disciplinary discharge planning process, led by the attending physician.  Recommendations may be updated based on patient status, additional functional criteria and insurance authorization. ? ?Follow Up Recommendations ? Home health PT ?  ?  ?Assistance Recommended at Discharge Intermittent Supervision/Assistance  ?Patient can return home with the following A little help with walking and/or transfers;Assist for transportation ?  ?Equipment Recommendations ? None recommended by PT  ?  ?Recommendations for Other Services   ? ? ?  ?Precautions / Restrictions Precautions ?Precautions: Fall;Back ?Precaution Booklet Issued: Yes (comment) ?Precaution Comments: back sx 08/07/21 with LSO brace, bladder urgency ?Required Braces or Orthoses: Spinal Brace ?Spinal Brace: Lumbar corset ?Restrictions ?Weight Bearing Restrictions: No ?Other  Position/Activity Restrictions: watch orthostatic  ?  ? ?Mobility ? Bed Mobility ?Overal bed mobility: Needs Assistance ?Bed Mobility: Rolling, Sidelying to Sit ?Rolling: Min assist ?Sidelying to sit: Min assist ?  ?  ?  ?General bed mobility comments: minA to bring LE to EOB and then to elevate trunk. increased time ?  ? ?Transfers ?Overall transfer level: Needs assistance ?Equipment used: Rolling walker (2 wheels) ?Transfers: Sit to/from Stand, Bed to chair/wheelchair/BSC ?Sit to Stand: Min guard ?  ?Step pivot transfers: Min guard ?  ?  ?  ?General transfer comment: increased time, then modA at times while pt attempting to pull up briefs. ?  ? ?Ambulation/Gait ?Ambulation/Gait assistance: Min guard, Mod assist ?Gait Distance (Feet): 125 Feet ?Assistive device: Rolling walker (2 wheels) ?Gait Pattern/deviations: Step-through pattern, Decreased stride length ?Gait velocity: 0.5 m/s ?Gait velocity interpretation: 1.31 - 2.62 ft/sec, indicative of limited community ambulator ?  ?General Gait Details: minG initially with pt using good gait speed, then instance of bilateral knee buckling requiring modA to step to recliner ? ? ? ?  ?Balance Overall balance assessment: Mild deficits observed, not formally tested ?  ?  ?  ?  ?  ?  ?  ?  ?  ?  ?  ?  ?  ?  ?  ?  ?  ?  ?  ? ?  ?Cognition Arousal/Alertness: Awake/alert ?Behavior During Therapy: [facility] for tasks assessed/performed ?Overall Cognitive Status: Within Functional Limits for tasks assessed ?  ?  ?  ?  ?  ?  ?  ?  ?  ?  ?  ?  ?  ?  ?  ?  ?  ?  ?  ? ?  ?   ?  General Comments General comments (skin integrity, edema, etc.): pt with x3 bilateral knee buckling after hallway ambulation needing modA to steady, BP once seated: 80/58 (62) with pt reporting weakness only ?  ?  ? ?Pertinent Vitals/Pain Pain Assessment ?Pain Assessment: Faces ?Faces Pain Scale: Hurts a little bit ?Pain Location: low back ?Pain Descriptors / Indicators: Aching ?Pain Intervention(s): Limited activity  within patient's tolerance, Monitored during session, Repositioned  ? ? ? ?PT Goals (current goals can now be found in the care plan section) Acute Rehab PT Goals ?Patient Stated Goal: return home ?PT Goal Formulation: With patient ?Time For Goal Achievement: 09/06/21 ?Potential to Achieve Goals: Good ?Progress towards PT goals: Progressing toward goals ? ?  ?Frequency ? ? ? Min 3X/week ? ? ? ?  ?PT Plan Current plan remains appropriate  ? ? ?   ?AM-PAC PT "6 Clicks" Mobility   ?Outcome Measure ? Help needed turning from your back to your side while in a flat bed without using bedrails?: A Little ?Help needed moving from lying on your back to sitting on the side of a flat bed without using bedrails?: A Little ?Help needed moving to and from a bed to a chair (including a wheelchair)?: A Little ?Help needed standing up from a chair using your arms (e.g., wheelchair or bedside chair)?: A Little ?Help needed to walk in hospital room?: A Lot ?Help needed climbing 3-5 steps with a railing? : A Little ?6 Click Score: 17 ? ?  ?End of Session Equipment Utilized During Treatment: Back brace ?Activity Tolerance: Patient tolerated treatment well ?Patient left: in chair;with call bell/phone within reach;with family/visitor present ?Nurse Communication: Mobility status ?PT Visit Diagnosis: Other abnormalities of gait and mobility (R26.89) ?  ? ? ?Time: 0258-5277 ?PT Time Calculation (min) (ACUTE ONLY): 31 min ? ?Charges:  $Therapeutic Exercise: 23-37 mins          ?          ? ?West Carbo, PT, DPT  ? ?Acute Rehabilitation Department ?Pager #: [phone] - [phone] ? ? ?Sandra Cockayne ?08/24/2021, 3:51 PM ? ?

## 2021-08-24 NOTE — TOC Progression Note (Addendum)
Transition of Care (TOC) - Progression Note  ? ? ?Patient Details  ?Name: CHRISTYN GUTKOWSKI ?MRN: [id number] ?Date of Birth: 05-23-1940 ? ?Transition of Care (TOC) CM/SW Contact  ?Zenon Mayo, RN ?Phone Number: ?08/24/2021, 3:00 PM ? ?Clinical Narrative:    ?NCM offered choice, she chose [facility] for [place], [place].  NCM made referral to [facility] with [place].  Soc will begin 24 to 48 hrs post dc.  Patient will have transport at dc by spouse.  She states she has all the DME she needs at home .  She uses [employer] in [employer].  TOC will continue to follow for dc needs.  ? ? ?Expected Discharge Plan: [place] ?Barriers to Discharge: Continued Medical Work up ? ?Expected Discharge Plan and Services ?Expected Discharge Plan: [place] ?  ?Discharge Planning Services: CM Consult ?Post Acute Care Choice: Home Health ?  ?                ?  ?DME Agency: NA ?  ?  ?  ?HH Arranged: PT, OT,Social Worker ?[place] Agency: [place] ?Date HH Agency Contacted: 08/24/21 ?Time [place]: 1749 ?Representative spoke with at [place]: [place] ? ? ?Social Determinants of Health (SDOH) Interventions ?  ? ?Readmission Risk Interventions ?   ? View : No data to display.  ?  ?  ?  ? ? ?

## 2021-08-25 ENCOUNTER — Inpatient Hospital Stay (HOSPITAL_COMMUNITY): Payer: Medicare Other

## 2021-08-25 DIAGNOSIS — R579 Shock, unspecified: Secondary | ICD-10-CM | POA: Diagnosis not present

## 2021-08-25 DIAGNOSIS — R1011 Right upper quadrant pain: Secondary | ICD-10-CM | POA: Diagnosis not present

## 2021-08-25 DIAGNOSIS — I5020 Unspecified systolic (congestive) heart failure: Secondary | ICD-10-CM | POA: Diagnosis not present

## 2021-08-25 LAB — BRAIN NATRIURETIC PEPTIDE: B Natriuretic Peptide: 334.4 pg/mL — ABNORMAL HIGH (ref 0.0–100.0)

## 2021-08-25 LAB — CBC
HCT: 26.8 % — ABNORMAL LOW (ref 36.0–46.0)
Hemoglobin: 8.9 g/dL — ABNORMAL LOW (ref 12.0–15.0)
MCH: 30.8 pg (ref 26.0–34.0)
MCHC: 33.2 g/dL (ref 30.0–36.0)
MCV: 92.7 fL (ref 80.0–100.0)
Platelets: 63 10*3/uL — ABNORMAL LOW (ref 150–400)
RBC: 2.89 MIL/uL — ABNORMAL LOW (ref 3.87–5.11)
RDW: 15.2 % (ref 11.5–15.5)
WBC: 11 10*3/uL — ABNORMAL HIGH (ref 4.0–10.5)
nRBC: 0 % (ref 0.0–0.2)

## 2021-08-25 LAB — COMPREHENSIVE METABOLIC PANEL
ALT: 28 U/L (ref 0–44)
AST: 17 U/L (ref 15–41)
Albumin: 1.7 g/dL — ABNORMAL LOW (ref 3.5–5.0)
Alkaline Phosphatase: 234 U/L — ABNORMAL HIGH (ref 38–126)
Anion gap: 7 (ref 5–15)
BUN: 30 mg/dL — ABNORMAL HIGH (ref 8–23)
CO2: 29 mmol/L (ref 22–32)
Calcium: 7.9 mg/dL — ABNORMAL LOW (ref 8.9–10.3)
Chloride: 103 mmol/L (ref 98–111)
Creatinine, Ser: 1.34 mg/dL — ABNORMAL HIGH (ref 0.44–1.00)
GFR, Estimated: 40 mL/min — ABNORMAL LOW (ref >60)
Glucose, Bld: 105 mg/dL — ABNORMAL HIGH (ref 70–99)
Potassium: 3.8 mmol/L (ref 3.5–5.1)
Sodium: 139 mmol/L (ref 135–145)
Total Bilirubin: 1.1 mg/dL (ref 0.3–1.2)
Total Protein: 4.7 g/dL — ABNORMAL LOW (ref 6.5–8.1)

## 2021-08-25 LAB — PROCALCITONIN: Procalcitonin: 7.59 ng/mL

## 2021-08-25 MED ORDER — METHOCARBAMOL 500 MG PO TABS: 500.0000 mg | ORAL_TABLET | Freq: Four times a day (QID) | ORAL | Status: DC | PRN

## 2021-08-25 MED ORDER — CHOLESTYRAMINE 4 G PO PACK
4.0000 g | PACK | Freq: Every day | ORAL | Status: DC
  Administered 2021-08-27: 4 g via ORAL
  Filled 2021-08-25 (×3): qty 1

## 2021-08-25 MED ORDER — CEFADROXIL 500 MG PO CAPS
1000.0000 mg | ORAL_CAPSULE | Freq: Two times a day (BID) | ORAL | Status: DC
  Administered 2021-08-26 – 2021-08-27 (×3): 1000 mg via ORAL
  Filled 2021-08-25 (×3): qty 2

## 2021-08-25 MED ORDER — DICLOFENAC SODIUM 1 % EX GEL
2.0000 g | Freq: Every day | CUTANEOUS | Status: DC | PRN
  Administered 2021-08-25 – 2021-08-26 (×2): 2 g via TOPICAL
  Filled 2021-08-25 (×2): qty 100

## 2021-08-25 MED ORDER — LORAZEPAM 0.5 MG PO TABS: 0.2500 mg | ORAL_TABLET | Freq: Every day | ORAL | Status: DC | PRN

## 2021-08-25 MED ORDER — GADOBUTROL 1 MMOL/ML IV SOLN
10.0000 mL | Freq: Once | INTRAVENOUS | Status: AC | PRN
  Administered 2021-08-25: 10 mL via INTRAVENOUS

## 2021-08-25 MED ORDER — CEFADROXIL 500 MG PO CAPS: 1000.0000 mg | ORAL_CAPSULE | Freq: Two times a day (BID) | ORAL | Status: DC

## 2021-08-25 NOTE — Progress Notes (Signed)
? ?Progress Note ? ?Patient Name: Christy Stephenson ?Date of Encounter: 08/25/2021 ? ?[facility] Cardiologist: None , saw Dr. Gwenlyn Found ? ?Subjective  ? ?Christy Stephenson feels well today. No SOB or CP. On IV antibiotics ? ?Inpatient Medications  ?  ?Scheduled Meds: ? apixaban  10 mg Oral BID  ? Followed by  ? [START ON 08/30/2021] apixaban  5 mg Oral BID  ? aspirin  81 mg Oral Daily  ? [START ON 08/26/2021] cefadroxil  1,000 mg Oral Q12H  ? Chlorhexidine Gluconate Cloth  6 each Topical Q0600  ? cholestyramine  4 g Oral Daily  ? fluticasone furoate-vilanterol  1 puff Inhalation Daily  ? And  ? umeclidinium bromide  1 puff Inhalation Daily  ? metoprolol tartrate  12.5 mg Oral BID  ? pantoprazole  40 mg Oral Q1200  ? pregabalin  100 mg Oral q morning  ? pregabalin  200 mg Oral QPM  ? sodium chloride flush  10-40 mL Intracatheter Q12H  ? ?Continuous Infusions: ? sodium chloride Stopped (08/21/21 0737)  ? ?PRN Meds: ?acetaminophen, albuterol, diclofenac Sodium, docusate sodium, LORazepam, melatonin, methocarbamol, ondansetron (ZOFRAN) IV, oxyCODONE, polyethylene glycol, sodium chloride flush, white petrolatum  ? ?Vital Signs  ?  ?Vitals:  ? 08/24/21 1916 08/25/21 7341 08/25/21 0348 08/25/21 0732  ?BP: (!) 110/48 118/65    ?Pulse: 100     ?Resp: 16 15    ?Temp: 99.3 ?F (37.4 ?C) 98.9 ?F (37.2 ?C)    ?TempSrc: Oral Oral    ?SpO2: 96% 95%  98%  ?Weight:   105.4 kg   ?Height:      ? ? ?Intake/Output Summary (Last 24 hours) at 08/25/2021 1008 ?Last data filed at 08/25/2021 9379 ?Gross per 24 hour  ?Intake 636.64 ml  ?Output 3800 ml  ?Net -3163.36 ml  ? ? ?  08/25/2021  ?  3:48 AM 08/24/2021  ?  3:15 AM 08/23/2021  ?  2:37 PM  ?Last 3 Weights  ?Weight (lbs) 232 lb 5.8 oz 240 lb 11.9 oz 240 lb 1.3 oz  ?Weight (kg) 105.4 kg 109.2 kg 108.9 kg  ?   ? ?Telemetry  ?  ?NSR - Personally Reviewed ? ?ECG  ?  ?No new  - Personally Reviewed ? ?Physical Exam  ? ?Vitals:  ? 08/25/21 0338 08/25/21 0732  ?BP: 118/65   ?Pulse:    ?Resp: 15   ?Temp: 98.9 ?F (37.2 ?C)    ?SpO2: 95% 98%  ? ? ?GEN: No acute distress.   ?HEENT: blister on her lip ?Neck: No JVD ?Cardiac: RRR, no murmurs, rubs, or gallops.  ?Respiratory: no rales, expiratory wheezes in the bases ?GI: Soft, nontender, non-distended  ?MS: No edema; No deformity. ?Neuro:  Nonfocal  ?Psych: Normal affect  ? ?Labs  ?  ?High Sensitivity Troponin:   ?Recent Labs  ?Lab 08/21/21 ?1246 08/21/21 ?1551 08/21/21 ?1751  ?TROPONINIHS 0,240* [id number]* 3,185*  ?   ?Chemistry ?Recent Labs  ?Lab 08/22/21 ?9735 08/22/21 ?1638 08/23/21 ?3299 08/24/21 ?2426 08/25/21 ?8341  ?NA 137   < > 140 139 139  ?K 3.5   < > 4.2 3.4* 3.8  ?CL 103   < > 108 105 103  ?CO2 23   < > '26 27 29  '$ ?GLUCOSE 109*   < > 108* 100* 105*  ?BUN 39*   < > 38* 35* 30*  ?CREATININE 2.07*   < > 1.67* 1.53* 1.34*  ?CALCIUM 7.5*   < > 7.9* 7.9* 7.9*  ?MG 1.5*  --  2.5*  --   --   ?PROT 4.6*  --  4.6* 4.4* 4.7*  ?ALBUMIN 1.9*  --  1.8* 1.6* 1.7*  ?AST 51*  --  39 26 17  ?ALT 58*  --  55* 39 28  ?ALKPHOS 292*  --  264* 264* 234*  ?BILITOT 3.1*  --  1.9* 1.3* 1.1  ?GFRNONAA 24*   < > 31* 34* 40*  ?ANIONGAP 11   < > '6 7 7  '$ ? < > = values in this interval not displayed.  ?  ?Lipids No results for input(s): CHOL, TRIG, HDL, LABVLDL, LDLCALC, CHOLHDL in the last 168 hours.  ?Hematology ?Recent Labs  ?Lab 08/23/21 ?0705 08/24/21 ?0259 08/25/21 ?0729  ?WBC 9.5 9.1 11.0*  ?RBC 3.09* 2.81* 2.89*  ?HGB 9.3* 8.7* 8.9*  ?HCT 28.2* 25.7* 26.8*  ?MCV 91.3 91.5 92.7  ?MCH 30.1 31.0 30.8  ?MCHC 33.0 33.9 33.2  ?RDW 14.6 14.8 15.2  ?PLT 53* 46* 63*  ? ?Thyroid  ?Recent Labs  ?Lab 08/21/21 ?1246 08/21/21 ?1551  ?TSH 0.298*  --   ?FREET4  --  1.08  ?  ?BNP ?Recent Labs  ?Lab 08/21/21 ?1246 08/25/21 ?0729  ?BNP 1,198.5* 334.4*  ?  ?DDimer No results for input(s): DDIMER in the last 168 hours.  ? ?Radiology  ?  ?MR ABDOMEN MRCP W WO CONTAST ? ?Result Date: 08/25/2021 ?CLINICAL DATA:  Right upper quadrant abdominal pain, elevated LFTs, dilated biliary duct status post cholecystectomy, nondiagnostic  ultrasound EXAM: MRI ABDOMEN WITHOUT AND WITH CONTRAST (INCLUDING MRCP) TECHNIQUE: Multiplanar multisequence MR imaging of the abdomen was performed both before and after the administration of intravenous contrast. Heavily T2-weighted images of the biliary and pancreatic ducts were obtained, and three-dimensional MRCP images were rendered by post processing. CONTRAST:  41m GADAVIST GADOBUTROL 1 MMOL/ML IV SOLN COMPARISON:  Right upper quadrant ultrasound, 08/21/2021 FINDINGS: Lower chest: Small pericardial effusion. Trace bilateral pleural effusions. Hepatobiliary: No mass or other parenchymal abnormality identified. Status post cholecystectomy. Mild postoperative biliary ductal dilatation, common bile duct measuring up to 0.9 cm in caliber, tapering smoothly to the ampulla without obstructing calculus or other lesion identified (series 17, image 6). Pancreas: No mass, inflammatory changes, or other parenchymal abnormality identified.No pancreatic ductal dilatation. Spleen:  Within normal limits in size and appearance. Adrenals/Urinary Tract: Normal adrenal glands. No renal masses or suspicious contrast enhancement identified. No evidence of hydronephrosis. Stomach/Bowel: Visualized portions within the abdomen are unremarkable. Vascular/Lymphatic: No pathologically enlarged lymph nodes identified. No abdominal aortic aneurysm demonstrated. Other:  Anasarca. Musculoskeletal: No suspicious osseous lesions identified. IMPRESSION: 1. Status post cholecystectomy. Mild postoperative biliary ductal dilatation, common bile duct measuring up to 0.9 cm in caliber, tapering smoothly to the ampulla without obstructing calculus or other lesion identified. 2. Small pericardial effusion. Trace bilateral pleural effusions. 3. Anasarca. Electronically Signed   By: ADelanna AhmadiM.D.   On: 08/25/2021 08:12   ? ?Cardiac Studies  ? ?Echo 08/21/2021 ?1. Left ventricular ejection fraction, by estimation, is 30 to 35%. Left  ?ventricular  ejection fraction by 3D volume is 35 %. The left ventricle has  ?moderately decreased function. The left ventricle demonstrates global  ?hypokinesis. There is mild left  ?ventricular hypertrophy. Left ventricular diastolic parameters are  ?consistent with Grade I diastolic dysfunction (impaired relaxation).  ? 2. Right ventricular systolic function is mildly reduced. The right  ?ventricular size is mildly enlarged. There is moderately elevated  ?pulmonary artery systolic pressure.  ? 3. The mitral valve is normal  in structure. Mild to moderate mitral valve  ?regurgitation. No evidence of mitral stenosis.  ? 4. Tricuspid valve regurgitation is mild to moderate.  ? 5. The aortic valve is normal in structure. Aortic valve regurgitation is  ?not visualized. No aortic stenosis is present ? ?Cath 2014 ?ANGIOGRAPHIC DATA:   The left main coronary artery is  normal . ?  ?The left anterior descending artery is normal . ?  ?The left circumflex artery is  Normal . ?  ?The right coronary artery is  dominant and normal . ?  ?LEFT VENTRICULOGRAM:  Left ventricular angiogram was done in the 30? RAO projection and revealed normal left ventricular wall motion and systolic function with an estimated ejection fraction of  60%.  ?  ?IMPRESSIONS:  1. 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 ?  ?2. Normal coronary arteries. ?  ?3. Normal left ventricular function with upper normal LVEDP.  ?  ?  ?RECOMMENDATION:  Aspirin, statin therapy, bilateral carotid Doppler study as an outpatient. Plan home later today.. ? ?Patient Profile  ?   ?Christy Stephenson is a 81 y.o. female with a hx of TIA in 2014, asthma, obstructive sleep apnea on CPAP, idiopathic progressive polyneuropathy, memory loss and morbid obesity who is being seen 08/24/2021 for the evaluation of low EF at the request of Dr. Lonny Prude. Presented here with septic shock + e coli on ceftriaxone. Also found to have a PE.  Incidentally found to have global moderate decreased LV function ? ?Of note   ?Patient with history of false positive stress test in 2014.  Cardiac catheterization showed normal coronaries. Coincidentally, the in

## 2021-08-25 NOTE — Progress Notes (Signed)
? ?PROGRESS NOTE ? ? ? ?Christy Stephenson  HQP:[id number] DOB: 11-Dec-1940 DOA: 08/21/2021 ?PCP: Seward Carol, MD ? ? ?Brief Narrative: ? ?Christy Stephenson is a 81 y.o. female with a history of idiopathic progressive polyneuropathy, asthma, OSA on CPAP, memory loss, asthma. Patient presented from an outside hospital for septic shock. She required admission to the ICU for vasopressor support. Managed on empiric Cefepime > Ceftriaxone. Associated AKI and elevated troponin. Blood cultures significant for E. Coli, presumably from urinary source.  ? ?ICU Course: ?4/4 admit to [facility]; started on heparin gtt for possible PE; V/Q w/ possible ?4/5: remains on levo ?4/6: off levo ? ?Assessment and Plan: ? ?Septic shock secondary to E. Coli bacteremia ?Presumed secondary to urinary source. Patient required vasopressor support which has been weaned off. Empiric Cefepime initiated and was transitioned to Ceftriaxone. BCID on outside hospital records indicate E. Coli infection. Discussed with outside hospital, who will fax over updated culture data as this is not viewable via Care Everywhere ?-Change ceftriaxone to PO abx ?-check orthostatics ? ?Acute respiratory failure with hypoxia ?In setting of likely PE. Patient is now on room air. ? ?Probable acute PE ?V/Q inconclusive but significant for moderate decreased perfusion in posterior left mid/lower lobes. Provoked in setting of recent surgery. Transthoracic Echocardiogram significant for mildly reduced RV function with mild enlargement. Associated elevated troponin as mentioned below. Started on Heparin IV and transitioned to Eliquis on 4/6. ?-Continue Eliquis for now ? ?Acute combined systolic and diastolic heart failure ?Appears to be a new diagnosis. Prior cardiac catheterization from 2014 without ischemic disease. Transthoracic Echocardiogram this admission shows EF of 30-35% with grade 1 diastolic dysfunction and LV global hypokinesis. Complicated by elevated troponin as mentioned  below. Currently without symptoms of chest pain. ?-Cardiology following for outpatient work up ? ?Elevated troponin ?In setting of critical illness; acute heart failure in setting of sepsis. Troponin trend of 1,661 > 3,397 > 3,185. No chest pain. EKG on admission without obvious evidence of ischemia. Transthoracic Echocardiogram results as mentioned above. ?-cards following with outpatient work up planned ? ?AKI ?Baseline creatinine of about 1. Creatinine on admission of 2.78. Likely secondary to above infection/sepsis. Improving. ? ?Acute anemia ?Baseline hemoglobin of about 14. Anemia appears to be secondary to prior spine surgery. Hemoglobin currently stable. ? ?Hyponatremia ?Mild. Resolved. ? ?Hyperbilirubinemia ?Elevated AST/ALT/ALP ?Unsure of etiology but possibly related to sepsis with shock.  ?-MRCP w/o obstruction ? ?Subclinical hypothyroidism ?Low TSH of 0.298 with normal free T4 of 1.08.  ?-Recommend repeat TSH/free T4 as an outpatient ? ?History of recent back surgery ?Neurosurgery consulted and evaluated. Per their assessment, bacteremia is unlikely from surgical wound. ? ?Asthma ?-Continue Breo Ellipta and Incruse Ellipta ? ?Idiopathic progressive polyneuropathy ?-Continue Lyrica ? ?OSA ?-Continue CPAP ? ?DVT prophylaxis: Eliquis ?Code Status:   Code Status: Full Code ?Family Communication: none at bedside ?Disposition Plan: patient says husband not able to care for her at home- will ask PT/OT to re-eval ? ? ?Consultants:  ?PCCM ?Neurosurgery ?Cardiology ? ? ? ? ?Subjective: ?Denies abdominal pain ? ?Objective: ?BP 118/65 (BP Location: Left Arm)   Pulse 100   Temp 98.9 ?F (37.2 ?C) (Oral)   Resp 15   Ht 5' 4.5" (1.638 m)   Wt 105.4 kg   SpO2 98%   BMI 39.27 kg/m?  ? ?Examination: ? ? ?General: Appearance:    Obese female in no acute distress  ?   ?Lungs:     respirations unlabored  ?Heart:  Tachycardic.   ?MS:   All extremities are intact.  ?  ?Neurologic:   Awake, alert, oriented x 3. No  apparent focal neurological           defect.   ?  ? ? ?Data Reviewed: I have personally reviewed following labs and imaging studies ? ?CBC ?Lab Results  ?Component Value Date  ? WBC 11.0 (H) 08/25/2021  ? RBC 2.89 (L) 08/25/2021  ? HGB 8.9 (L) 08/25/2021  ? HCT 26.8 (L) 08/25/2021  ? MCV 92.7 08/25/2021  ? MCH 30.8 08/25/2021  ? PLT 63 (L) 08/25/2021  ? MCHC 33.2 08/25/2021  ? RDW 15.2 08/25/2021  ? LYMPHSABS 1.3 05/07/2021  ? MONOABS 0.5 05/07/2021  ? EOSABS 0.3 05/07/2021  ? BASOSABS 0.0 05/07/2021  ? ? ? ?Last metabolic panel ?Lab Results  ?Component Value Date  ? NA 139 08/25/2021  ? K 3.8 08/25/2021  ? CL 103 08/25/2021  ? CO2 29 08/25/2021  ? BUN 30 (H) 08/25/2021  ? CREATININE 1.34 (H) 08/25/2021  ? GLUCOSE 105 (H) 08/25/2021  ? GFRNONAA 40 (L) 08/25/2021  ? GFRAA >60 12/07/2019  ? CALCIUM 7.9 (L) 08/25/2021  ? PHOS 4.0 08/22/2021  ? PROT 4.7 (L) 08/25/2021  ? ALBUMIN 1.7 (L) 08/25/2021  ? BILITOT 1.1 08/25/2021  ? ALKPHOS 234 (H) 08/25/2021  ? AST 17 08/25/2021  ? ALT 28 08/25/2021  ? ANIONGAP 7 08/25/2021  ? ? ?GFR: ?Estimated Creatinine Clearance: 40.7 mL/min (A) (by C-G formula based on SCr of 1.34 mg/dL (H)). ? ?Recent Results (from the past 240 hour(s))  ?MRSA Next Gen by PCR, Nasal     Status: None  ? Collection Time: 08/21/21  6:09 AM  ? Specimen: Nasal Mucosa; Nasal Swab  ?Result Value Ref Range Status  ? MRSA by PCR Next Gen NOT DETECTED NOT DETECTED Final  ?  Comment: (NOTE) ?The GeneXpert MRSA Assay (FDA approved for NASAL specimens only), ?is one component of a comprehensive MRSA colonization surveillance ?program. It is not intended to diagnose MRSA infection nor to guide ?or monitor treatment for MRSA infections. ?Test performance is not FDA approved in patients less than 2 years ?old. ?Performed at [facility], [place] [address] [place], [place] ?[address] ?  ?Culture, blood (routine x 2)     Status: None (Preliminary result)  ? Collection Time: 08/21/21 10:16 AM  ? Specimen: BLOOD   ?Result Value Ref Range Status  ? Specimen Description BLOOD BLOOD LEFT FOREARM  Final  ? Special Requests   Final  ?  BOTTLES DRAWN AEROBIC ONLY Blood Culture results may not be optimal due to an inadequate volume of blood received in culture bottles  ? Culture   Final  ?  NO GROWTH 3 DAYS ?Performed at [facility], [place] [address] [place], [place] [address] ?  ? Report Status PENDING  Incomplete  ?Culture, blood (routine x 2)     Status: None (Preliminary result)  ? Collection Time: 08/21/21 10:18 AM  ? Specimen: BLOOD  ?Result Value Ref Range Status  ? Specimen Description BLOOD BLOOD RIGHT HAND  Final  ? Special Requests   Final  ?  BOTTLES DRAWN AEROBIC ONLY Blood Culture results may not be optimal due to an inadequate volume of blood received in culture bottles  ? Culture   Final  ?  NO GROWTH 3 DAYS ?Performed at [facility], [place] [address] [place], [place] [address] ?  ? Report Status PENDING  Incomplete  ?Urine Culture     Status: Abnormal  ? Collection Time: 08/21/21  3:04 PM  ? Specimen: Urine, Clean Catch  ?Result Value Ref Range Status  ? Specimen Description URINE, CLEAN CATCH  Final  ? Special Requests   Final  ?  NONE ?Performed at [facility], [place] [address] [place], [place] [address] ?  ? Culture MULTIPLE SPECIES PRESENT, SUGGEST RECOLLECTION (A)  Final  ? Report Status 08/23/2021 FINAL  Final  ?  ? ? ?Radiology Studies: ?MR ABDOMEN MRCP W WO CONTAST ? ?Result Date: 08/25/2021 ?CLINICAL DATA:  Right upper quadrant abdominal pain, elevated LFTs, dilated biliary duct status post cholecystectomy, nondiagnostic ultrasound EXAM: MRI ABDOMEN WITHOUT AND WITH CONTRAST (INCLUDING MRCP) TECHNIQUE: Multiplanar multisequence MR imaging of the abdomen was performed both before and after the administration of intravenous contrast. Heavily T2-weighted images of the biliary and pancreatic ducts were obtained, and three-dimensional MRCP images were rendered by post processing. CONTRAST:  77m  GADAVIST GADOBUTROL 1 MMOL/ML IV SOLN COMPARISON:  Right upper quadrant ultrasound, 08/21/2021 FINDINGS: Lower chest: Small pericardial effusion. Trace bilateral pleural effusions. Hepatobiliary: No mass or other par

## 2021-08-25 NOTE — Progress Notes (Signed)
Placed patient on CPAP for HS use. Tolerating well at this time.  ?

## 2021-08-25 NOTE — Progress Notes (Signed)
Physical Therapy Treatment ?Patient Details ?Name: Christy Stephenson ?MRN: [id number] ?DOB: Nov 15, 1940 ?Today's Date: 08/25/2021 ? ? ?History of Present Illness Pt is a 81 y/o female with presented with fever, weakness, dyspnea, and drainage from surgical incision (back). Chest CT negative for PE, Doppler negative for DVT despite elevated d.dimer. Pt admitted to ICU for septic shock and AKI. Recent admission 3/21 for lumbar decompression/fusion. PMH: asthma, prior back surgery. cardiac cath, R TKA. ? ?  ?PT Comments  ? ? Pt was seen for mobility today, after assisting to side of bed and noting her BP values in all postures.  Supine was 91/51, sit 122/53, standing 124/52 and post gait was 103/46.  Her persistent BP readings in low diastolic ranges were also not reported as symptomatic, but did stop and have a differing voice quality with gait for a short trip.  Her time up in chair was accompanied by family and has safety features in place to prevent her from getting up alone.  Follow along with her to increase tolerance for activity as BP permits, and monitor these numbers to ensure she can exercise option to return home with family.  ?Recommendations for follow up therapy are one component of a multi-disciplinary discharge planning process, led by the attending physician.  Recommendations may be updated based on patient status, additional functional criteria and insurance authorization. ? ?Follow Up Recommendations ? Home health PT ?  ?  ?Assistance Recommended at Discharge Intermittent Supervision/Assistance  ?Patient can return home with the following A little help with walking and/or transfers;A little help with bathing/dressing/bathroom;Assistance with cooking/housework;Assist for transportation;Help with stairs or ramp for entrance ?  ?Equipment Recommendations ? None recommended by PT  ?  ?Recommendations for Other Services   ? ? ?  ?Precautions / Restrictions Precautions ?Precautions: Fall;Back ?Precaution Booklet  Issued: Yes (comment) ?Precaution Comments: back sx 08/07/21 with LSO brace, bladder urgency ?Required Braces or Orthoses: Spinal Brace ?Spinal Brace: Lumbar corset ?Restrictions ?Weight Bearing Restrictions: No ?Other Position/Activity Restrictions: watch orthostatic  ?  ? ?Mobility ? Bed Mobility ?Overal bed mobility: Needs Assistance ?Bed Mobility: Sidelying to Sit, Rolling ?Rolling: Min assist ?Sidelying to sit: Min assist ?  ?  ?  ?  ?  ? ?Transfers ?Overall transfer level: Needs assistance ?Equipment used: Rolling walker (2 wheels), 1 person hand held assist ?Transfers: Sit to/from Stand ?Sit to Stand: Min assist ?  ?  ?  ?  ?  ?General transfer comment: min assist to get her up to walk and go around the bed to chair ?  ? ?Ambulation/Gait ?Ambulation/Gait assistance: Min assist ?Gait Distance (Feet): 18 Feet ?Assistive device: Rolling walker (2 wheels) ?Gait Pattern/deviations: Step-through pattern, Decreased stride length, Trunk flexed (requries postural reminders due to spinal surgery) ?  ?  ?Pre-gait activities: BP checks ?General Gait Details: requires close guard to walk to chair around bed but is stopping briefly as she changes sound of her voice ? ? ?Stairs ?  ?  ?  ?  ?  ? ? ?Wheelchair Mobility ?  ? ?Modified Rankin (Stroke Patients Only) ?  ? ? ?  ?Balance Overall balance assessment: Needs assistance ?Sitting-balance support: Feet supported ?Sitting balance-Leahy Scale: Fair ?  ?  ?Standing balance support: Bilateral upper extremity supported, During functional activity ?Standing balance-Leahy Scale: Poor ?  ?  ?  ?  ?  ?  ?  ?  ?  ?  ?  ?  ?  ? ?  ?Cognition Arousal/Alertness: Awake/alert ?Behavior During Therapy:  WFL for tasks assessed/performed ?Overall Cognitive Status: Within Functional Limits for tasks assessed ?  ?  ?  ?  ?  ?  ?  ?  ?  ?  ?  ?  ?  ?  ?  ?  ?General Comments: chatty and positive ?  ?  ? ?  ?Exercises   ? ?  ?General Comments General comments (skin integrity, edema, etc.): Pt  demonstrates real concerns for orthostatic BP including after walking in which she reports being asymptomatic but has times stopping momentarily and voice changes ?  ?  ? ?Pertinent Vitals/Pain Pain Assessment ?Pain Assessment: Faces ?Faces Pain Scale: Hurts a little bit ?Pain Location: low back ?Pain Descriptors / Indicators: Guarding ?Pain Intervention(s): Monitored during session, Repositioned  ? ? ?Home Living   ?  ?  ?  ?  ?  ?  ?  ?  ?  ?   ?  ?Prior Function    ?  ?  ?   ? ?PT Goals (current goals can now be found in the care plan section) Acute Rehab PT Goals ?Patient Stated Goal: return home ?Progress towards PT goals: Progressing toward goals ? ?  ?Frequency ? ? ? Min 3X/week ? ? ? ?  ?PT Plan Current plan remains appropriate  ? ? ?Co-evaluation   ?  ?  ?  ?  ? ?  ?AM-PAC PT "6 Clicks" Mobility   ?Outcome Measure ? Help needed turning from your back to your side while in a flat bed without using bedrails?: A Little ?Help needed moving from lying on your back to sitting on the side of a flat bed without using bedrails?: A Little ?Help needed moving to and from a bed to a chair (including a wheelchair)?: A Little ?Help needed standing up from a chair using your arms (e.g., wheelchair or bedside chair)?: A Little ?Help needed to walk in hospital room?: A Little ?Help needed climbing 3-5 steps with a railing? : A Lot ?6 Click Score: 17 ? ?  ?End of Session Equipment Utilized During Treatment: Back brace;Gait belt ?Activity Tolerance: Patient limited by fatigue;Treatment limited secondary to medical complications (Comment) ?Patient left: in chair;with call bell/phone within reach;with chair alarm set ?Nurse Communication: Mobility status ?PT Visit Diagnosis: Unsteadiness on feet (R26.81);Other abnormalities of gait and mobility (R26.89);Muscle weakness (generalized) (M62.81) ?Pain - part of body:  (back) ?  ? ? ?Time: 7588-3254 ?PT Time Calculation (min) (ACUTE ONLY): 52 min ? ?Charges:  $Gait Training: 8-22  mins ?$Therapeutic Activity: 23-37 mins     ?Ramond Dial ?08/25/2021, 6:39 PM ? ?Mee Hives, PT PhD ?Acute Rehab Dept. Number: [facility] [phone] and [place] [phone] ? ? ?

## 2021-08-26 DIAGNOSIS — R579 Shock, unspecified: Secondary | ICD-10-CM | POA: Diagnosis not present

## 2021-08-26 DIAGNOSIS — N179 Acute kidney failure, unspecified: Secondary | ICD-10-CM | POA: Diagnosis not present

## 2021-08-26 DIAGNOSIS — I5020 Unspecified systolic (congestive) heart failure: Secondary | ICD-10-CM | POA: Diagnosis not present

## 2021-08-26 DIAGNOSIS — R7881 Bacteremia: Secondary | ICD-10-CM | POA: Diagnosis not present

## 2021-08-26 LAB — CBC
HCT: 26.2 % — ABNORMAL LOW (ref 36.0–46.0)
Hemoglobin: 8.3 g/dL — ABNORMAL LOW (ref 12.0–15.0)
MCH: 29.7 pg (ref 26.0–34.0)
MCHC: 31.7 g/dL (ref 30.0–36.0)
MCV: 93.9 fL (ref 80.0–100.0)
Platelets: 74 10*3/uL — ABNORMAL LOW (ref 150–400)
RBC: 2.79 MIL/uL — ABNORMAL LOW (ref 3.87–5.11)
RDW: 14.9 % (ref 11.5–15.5)
WBC: 11.4 10*3/uL — ABNORMAL HIGH (ref 4.0–10.5)
nRBC: 0 % (ref 0.0–0.2)

## 2021-08-26 LAB — CULTURE, BLOOD (ROUTINE X 2)
Culture: NO GROWTH
Culture: NO GROWTH

## 2021-08-26 LAB — BASIC METABOLIC PANEL
Anion gap: 9 (ref 5–15)
BUN: 21 mg/dL (ref 8–23)
CO2: 26 mmol/L (ref 22–32)
Calcium: 7.8 mg/dL — ABNORMAL LOW (ref 8.9–10.3)
Chloride: 103 mmol/L (ref 98–111)
Creatinine, Ser: 1.17 mg/dL — ABNORMAL HIGH (ref 0.44–1.00)
GFR, Estimated: 47 mL/min — ABNORMAL LOW (ref >60)
Glucose, Bld: 126 mg/dL — ABNORMAL HIGH (ref 70–99)
Potassium: 3.5 mmol/L (ref 3.5–5.1)
Sodium: 138 mmol/L (ref 135–145)

## 2021-08-26 MED ORDER — MENTHOL 3 MG MT LOZG
1.0000 | LOZENGE | OROMUCOSAL | Status: DC | PRN
  Administered 2021-08-26: 3 mg via ORAL
  Filled 2021-08-26: qty 9

## 2021-08-26 MED ORDER — METOPROLOL SUCCINATE ER 25 MG PO TB24
25.0000 mg | ORAL_TABLET | Freq: Every day | ORAL | Status: DC
  Administered 2021-08-26 – 2021-08-27 (×2): 25 mg via ORAL
  Filled 2021-08-26 (×2): qty 1

## 2021-08-26 MED ORDER — POTASSIUM CHLORIDE CRYS ER 20 MEQ PO TBCR
20.0000 meq | EXTENDED_RELEASE_TABLET | Freq: Once | ORAL | Status: AC
  Administered 2021-08-26: 20 meq via ORAL
  Filled 2021-08-26: qty 1

## 2021-08-26 NOTE — Progress Notes (Signed)
? ?Progress Note ? ?Patient Name: Christy Stephenson ?Date of Encounter: 08/26/2021 ? ?[facility] Cardiologist: Quay Burow, MD  ? ?Subjective  ? ?Denies any CP or SOB.  ? ?Inpatient Medications  ?  ?Scheduled Meds: ? apixaban  10 mg Oral BID  ? Followed by  ? [START ON 08/30/2021] apixaban  5 mg Oral BID  ? aspirin  81 mg Oral Daily  ? cefadroxil  1,000 mg Oral Q12H  ? Chlorhexidine Gluconate Cloth  6 each Topical Q0600  ? cholestyramine  4 g Oral Daily  ? fluticasone furoate-vilanterol  1 puff Inhalation Daily  ? And  ? umeclidinium bromide  1 puff Inhalation Daily  ? metoprolol succinate  25 mg Oral Daily  ? pantoprazole  40 mg Oral Q1200  ? pregabalin  100 mg Oral q morning  ? pregabalin  200 mg Oral QPM  ? sodium chloride flush  10-40 mL Intracatheter Q12H  ? ?Continuous Infusions: ? sodium chloride Stopped (08/21/21 0737)  ? ?PRN Meds: ?acetaminophen, albuterol, diclofenac Sodium, docusate sodium, LORazepam, melatonin, methocarbamol, ondansetron (ZOFRAN) IV, oxyCODONE, polyethylene glycol, sodium chloride flush, white petrolatum  ? ?Vital Signs  ?  ?Vitals:  ? 08/25/21 2246 08/26/21 0416 08/26/21 0417 08/26/21 0704  ?BP:  (!) 104/50    ?Pulse: 77 65    ?Resp: 20 12    ?Temp:  98.1 ?F (36.7 ?C)    ?TempSrc:  Oral    ?SpO2: 95% 97%  99%  ?Weight:   104.7 kg   ?Height:      ? ? ?Intake/Output Summary (Last 24 hours) at 08/26/2021 0813 ?Last data filed at 08/26/2021 0418 ?Gross per 24 hour  ?Intake 700 ml  ?Output 1475 ml  ?Net -775 ml  ? ? ?  08/26/2021  ?  4:17 AM 08/25/2021  ?  3:48 AM 08/24/2021  ?  3:15 AM  ?Last 3 Weights  ?Weight (lbs) 230 lb 13.2 oz 232 lb 5.8 oz 240 lb 11.9 oz  ?Weight (kg) 104.7 kg 105.4 kg 109.2 kg  ?   ? ?Telemetry  ?  ?NSR without significant ventricular ectopy - Personally Reviewed ? ?ECG  ?  ?NSR without significant ST T wave changes - Personally Reviewed ? ?Physical Exam  ? ?GEN: No acute distress.   ?Neck: No JVD ?Cardiac: RRR, no murmurs, rubs, or gallops.  ?Respiratory: Clear to  auscultation bilaterally. ?GI: Soft, nontender, non-distended  ?MS: No edema; No deformity. ?Neuro:  Nonfocal  ?Psych: Normal affect  ? ?Labs  ?  ?High Sensitivity Troponin:   ?Recent Labs  ?Lab 08/21/21 ?1246 08/21/21 ?1551 08/21/21 ?1751  ?TROPONINIHS 7,035* [id number]* 3,185*  ?   ?Chemistry ?Recent Labs  ?Lab 08/22/21 ?0093 08/22/21 ?1638 08/23/21 ?8182 08/24/21 ?9937 08/25/21 ?1696  ?NA 137   < > 140 139 139  ?K 3.5   < > 4.2 3.4* 3.8  ?CL 103   < > 108 105 103  ?CO2 23   < > '26 27 29  '$ ?GLUCOSE 109*   < > 108* 100* 105*  ?BUN 39*   < > 38* 35* 30*  ?CREATININE 2.07*   < > 1.67* 1.53* 1.34*  ?CALCIUM 7.5*   < > 7.9* 7.9* 7.9*  ?MG 1.5*  --  2.5*  --   --   ?PROT 4.6*  --  4.6* 4.4* 4.7*  ?ALBUMIN 1.9*  --  1.8* 1.6* 1.7*  ?AST 51*  --  39 26 17  ?ALT 58*  --  55* 39 28  ?  ALKPHOS 292*  --  264* 264* 234*  ?BILITOT 3.1*  --  1.9* 1.3* 1.1  ?GFRNONAA 24*   < > 31* 34* 40*  ?ANIONGAP 11   < > '6 7 7  '$ ? < > = values in this interval not displayed.  ?  ?Lipids No results for input(s): CHOL, TRIG, HDL, LABVLDL, LDLCALC, CHOLHDL in the last 168 hours.  ?Hematology ?Recent Labs  ?Lab 08/24/21 ?4403 08/25/21 ?0729 08/26/21 ?4742  ?WBC 9.1 11.0* 11.4*  ?RBC 2.81* 2.89* 2.79*  ?HGB 8.7* 8.9* 8.3*  ?HCT 25.7* 26.8* 26.2*  ?MCV 91.5 92.7 93.9  ?MCH 31.0 30.8 29.7  ?MCHC 33.9 33.2 31.7  ?RDW 14.8 15.2 14.9  ?PLT 46* 63* 74*  ? ?Thyroid  ?Recent Labs  ?Lab 08/21/21 ?1246 08/21/21 ?1551  ?TSH 0.298*  --   ?FREET4  --  1.08  ?  ?BNP ?Recent Labs  ?Lab 08/21/21 ?1246 08/25/21 ?0729  ?BNP 1,198.5* 334.4*  ?  ?DDimer No results for input(s): DDIMER in the last 168 hours.  ? ?Radiology  ?  ?MR ABDOMEN MRCP W WO CONTAST ? ?Result Date: 08/25/2021 ?CLINICAL DATA:  Right upper quadrant abdominal pain, elevated LFTs, dilated biliary duct status post cholecystectomy, nondiagnostic ultrasound EXAM: MRI ABDOMEN WITHOUT AND WITH CONTRAST (INCLUDING MRCP) TECHNIQUE: Multiplanar multisequence MR imaging of the abdomen was performed both before and  after the administration of intravenous contrast. Heavily T2-weighted images of the biliary and pancreatic ducts were obtained, and three-dimensional MRCP images were rendered by post processing. CONTRAST:  2m GADAVIST GADOBUTROL 1 MMOL/ML IV SOLN COMPARISON:  Right upper quadrant ultrasound, 08/21/2021 FINDINGS: Lower chest: Small pericardial effusion. Trace bilateral pleural effusions. Hepatobiliary: No mass or other parenchymal abnormality identified. Status post cholecystectomy. Mild postoperative biliary ductal dilatation, common bile duct measuring up to 0.9 cm in caliber, tapering smoothly to the ampulla without obstructing calculus or other lesion identified (series 17, image 6). Pancreas: No mass, inflammatory changes, or other parenchymal abnormality identified.No pancreatic ductal dilatation. Spleen:  Within normal limits in size and appearance. Adrenals/Urinary Tract: Normal adrenal glands. No renal masses or suspicious contrast enhancement identified. No evidence of hydronephrosis. Stomach/Bowel: Visualized portions within the abdomen are unremarkable. Vascular/Lymphatic: No pathologically enlarged lymph nodes identified. No abdominal aortic aneurysm demonstrated. Other:  Anasarca. Musculoskeletal: No suspicious osseous lesions identified. IMPRESSION: 1. Status post cholecystectomy. Mild postoperative biliary ductal dilatation, common bile duct measuring up to 0.9 cm in caliber, tapering smoothly to the ampulla without obstructing calculus or other lesion identified. 2. Small pericardial effusion. Trace bilateral pleural effusions. 3. Anasarca. Electronically Signed   By: ADelanna AhmadiM.D.   On: 08/25/2021 08:12   ? ?Cardiac Studies  ? ?VQ scan 08/21/2021 ?IMPRESSION: ?Moderate sized areas of decreased perfusion are noted in the ?posterior left mid and left lower lung fields. The study is ?inconclusive to evaluate for pulmonary embolism. If there is ?continued clinical suspicion for pulmonary embolism,  please consider ?venous Doppler examination of both lower extremities and possibly CT ?angiogram. ? ?Patient Profile  ?   ?81y.o. female with PMH of TIA in 2014, asthma, OSA on CPAP, COVID 03/2021, idiopathic progressive polyneuropathy, memory loss and morbid obesity who presented with septic shock with + e coli bacteremia treated with abx (presumed urinary source). Found to have a PE on VQ scan and new LV dysfunction (30-35%). Recently underwent lumbar surgery. Previously had normal cors on cath in 2014 with occluded innominate artery.  ? ?Assessment & Plan  ?  ?New LV dysfunction ? -  Echo showed EF 30-35%, grade 1 DD in the setting PE and sepsis ? - baseline chronic dyspnea due to severe asthma, denies any recent chest pain ? - serial trop 1661 --> 3397 --> 3185 ? - given normal cors in 2014 after abnormal myoview, plan for outpatient coronary CTA (patient has no problem laying flat) and repeat echo in 3 month.  ? - consolidate metoprolol tartrate to metoprolol succinate.  ? ?PE: VQ scan on 08/21/2021 showed moderate sized areas of decreased perfusion in the posterior left mid and left lower lung fields. PE dosing of Eliquis ? ?Septic shock: e coli bacteremia, presumed urinary source. Resolved. Off pressors. BP still borderline soft ? ?AKI: Cr 2.78, improved to 1.34.  ? ?H/o TIA in 2014 ? ?Recent back surgery: on braces when she stand, able to take back braces off during sleep, should not interfere with outpatient coronary CT ? ?   ? ?For questions or updates, please contact Campus ?Please consult [email or web address] for contact info under  ? ?  ?   ?Signed, ?Almyra Deforest, [place]  ?08/26/2021, 8:13 AM   ? ?

## 2021-08-26 NOTE — TOC Progression Note (Signed)
Transition of Care (TOC) - Progression Note  ? ? ?Patient Details  ?Name: Christy Stephenson ?MRN: [id number] ?Date of Birth: [birth year] ? ?Transition of Care (TOC) CM/SW Contact  ?Bary Castilla, LCSW ?Phone Number: [phone] ?08/26/2021, 8:28 AM ? ?Clinical Narrative:    ? ?CSW was alerted by RN that pt wanted to speak with CSW.. CSW met with pt and she informed CSW that her niece works at a SNF in [place] [facility] and she would like to go there. Pt stated that PT informed her yesterday that she should not return home yet. CSW read note from last night and the recommendation was for [facility]. Pt seemed confused about that information however stated that she was aware that she needed to be DC home. CSW confirmed with her [place] choice per previous note. ? ?CSW spoken with pt's RN and she informed CSW that pt had go up on her own this morning. CSW stated that if a need arises to reach out. ? ?TOC team will continue to assist with discharge planning needs. ? ? ?Expected Discharge Plan: [place] ?Barriers to Discharge: Continued Medical Work up ? ?Expected Discharge Plan and Services ?Expected Discharge Plan: [place] ?  ?Discharge Planning Services: CM Consult ?Post Acute Care Choice: Home Health ?  ?                ?  ?DME Agency: NA ?  ?  ?  ?HH Arranged: PT, OT ?[place] Agency: [place] ?Date HH Agency Contacted: 08/24/21 ?Time [place]: 8520 ?Representative spoke with at [place]: [place] ? ? ?Social Determinants of Health (SDOH) Interventions ?  ? ?Readmission Risk Interventions ?   ? View : No data to display.  ?  ?  ?  ? ? ?

## 2021-08-26 NOTE — Plan of Care (Signed)
?  Problem: Coping: ?Goal: Level of anxiety will decrease ?Outcome: Completed/Met ?  ?Problem: Nutrition: ?Goal: Adequate nutrition will be maintained ?Outcome: Completed/Met ?  ?

## 2021-08-26 NOTE — Progress Notes (Signed)
RT placed pt on CPAP. Patient tolerating settings well at this time. RT will monitor as needed. ?

## 2021-08-26 NOTE — Progress Notes (Signed)
? ?PROGRESS NOTE ? ? ? ?MARCE SCHARTZ  IHK:[id number] DOB: [birth year] DOA: 08/21/2021 ?PCP: Seward Carol, MD ? ? ?Brief Narrative: ? ?Christy Stephenson is a 81 y.o. female with a history of idiopathic progressive polyneuropathy, asthma, OSA on CPAP, memory loss, asthma. Patient presented from an outside hospital for septic shock. She required admission to the ICU for vasopressor support. Managed on empiric Cefepime > Ceftriaxone. Associated AKI and elevated troponin. Blood cultures significant for E. Coli, presumably from urinary source.  ? ?ICU Course: ?4/4 admit to [facility]; started on heparin gtt for possible PE; V/Q w/ possible ?4/5: remains on levo ?4/6: off levo ? ?Assessment and Plan: ? ?Septic shock secondary to E. Coli bacteremia ?Presumed secondary to urinary source. Patient required vasopressor support which has been weaned off. Empiric Cefepime initiated and was transitioned to Ceftriaxone. BCID on outside hospital records indicate E. Coli infection. Discussed with outside hospital, who will fax over updated culture data as this is not viewable via Care Everywhere ?-Change ceftriaxone to PO abx ?-BP dropped at 3 min-- added TED hose ? ?Acute respiratory failure with hypoxia ?In setting of likely PE. Patient is now on room air. ? ?Probable acute PE ?V/Q inconclusive but significant for moderate decreased perfusion in posterior left mid/lower lobes. Provoked in setting of recent surgery. Transthoracic Echocardiogram significant for mildly reduced RV function with mild enlargement. Associated elevated troponin as mentioned below. Started on Heparin IV and transitioned to Eliquis on 4/6. ?-Continue Eliquis for now ? ?Acute combined systolic and diastolic heart failure ?Appears to be a new diagnosis. Prior cardiac catheterization from 2014 without ischemic disease. Transthoracic Echocardiogram this admission shows EF of 30-35% with grade 1 diastolic dysfunction and LV global hypokinesis. Complicated by elevated  troponin as mentioned below. Currently without symptoms of chest pain. ?-Cardiology following for outpatient work up ? ?Elevated troponin ?In setting of critical illness; acute heart failure in setting of sepsis. Troponin trend of 1,661 > 3,397 > 3,185. No chest pain. EKG on admission without obvious evidence of ischemia. Transthoracic Echocardiogram results as mentioned above. ?-cards following with outpatient work up planned ? ?AKI ?Baseline creatinine of about 1. Creatinine on admission of 2.78. Likely secondary to above infection/sepsis. Improving. ?-labs in AM ? ?Acute anemia ?Baseline hemoglobin of about 14. Anemia appears to be secondary to prior spine surgery. Hemoglobin currently stable. ? ?Hyponatremia ?Mild. Resolved. ? ?Hyperbilirubinemia ?Elevated AST/ALT/ALP ?Unsure of etiology but possibly related to sepsis with shock.  ?-MRCP w/o obstruction ? ?Subclinical hypothyroidism ?Low TSH of 0.298 with normal free T4 of 1.08.  ?-Recommend repeat TSH/free T4 as an outpatient ? ?History of recent back surgery ?Neurosurgery consulted and evaluated. Per their assessment, bacteremia is unlikely from surgical wound. ? ?Asthma ?-Continue Breo Ellipta and Incruse Ellipta ? ?Idiopathic progressive polyneuropathy ?-Continue Lyrica ? ?OSA ?-Continue CPAP ? ?DVT prophylaxis: Eliquis ?Code Status:   Code Status: Full Code ?Family Communication: none at bedside ?Disposition Plan: home in 24-48 hours ? ? ?Consultants:  ?PCCM ?Neurosurgery ?Cardiology ? ? ? ? ?Subjective: ?No SOB, no CP ? ?Objective: ?BP (!) 104/43 (BP Location: Right Arm)   Pulse 76   Temp 97.9 ?F (36.6 ?C) (Oral)   Resp 17   Ht 5' 4.5" (1.638 m)   Wt 104.7 kg   SpO2 100%   BMI 39.01 kg/m?  ? ?Examination: ? ? ?General: Appearance:    Obese female in no acute distress  ?   ?Lungs:     respirations unlabored  ?Heart:  Normal heart rate.   ?MS:   All extremities are intact.  ?  ?Neurologic:   Awake, alert, oriented x 3. No apparent focal neurological            defect.   ?  ? ? ?Data Reviewed: I have personally reviewed following labs and imaging studies ? ?CBC ?Lab Results  ?Component Value Date  ? WBC 11.4 (H) 08/26/2021  ? RBC 2.79 (L) 08/26/2021  ? HGB 8.3 (L) 08/26/2021  ? HCT 26.2 (L) 08/26/2021  ? MCV 93.9 08/26/2021  ? MCH 29.7 08/26/2021  ? PLT 74 (L) 08/26/2021  ? MCHC 31.7 08/26/2021  ? RDW 14.9 08/26/2021  ? LYMPHSABS 1.3 05/07/2021  ? MONOABS 0.5 05/07/2021  ? EOSABS 0.3 05/07/2021  ? BASOSABS 0.0 05/07/2021  ? ? ? ?Last metabolic panel ?Lab Results  ?Component Value Date  ? NA 138 08/26/2021  ? K 3.5 08/26/2021  ? CL 103 08/26/2021  ? CO2 26 08/26/2021  ? BUN 21 08/26/2021  ? CREATININE 1.17 (H) 08/26/2021  ? GLUCOSE 126 (H) 08/26/2021  ? GFRNONAA 47 (L) 08/26/2021  ? GFRAA >60 12/07/2019  ? CALCIUM 7.8 (L) 08/26/2021  ? PHOS 4.0 08/22/2021  ? PROT 4.7 (L) 08/25/2021  ? ALBUMIN 1.7 (L) 08/25/2021  ? BILITOT 1.1 08/25/2021  ? ALKPHOS 234 (H) 08/25/2021  ? AST 17 08/25/2021  ? ALT 28 08/25/2021  ? ANIONGAP 9 08/26/2021  ? ? ?GFR: ?Estimated Creatinine Clearance: 46.4 mL/min (A) (by C-G formula based on SCr of 1.17 mg/dL (H)). ? ?Recent Results (from the past 240 hour(s))  ?MRSA Next Gen by PCR, Nasal     Status: None  ? Collection Time: 08/21/21  6:09 AM  ? Specimen: Nasal Mucosa; Nasal Swab  ?Result Value Ref Range Status  ? MRSA by PCR Next Gen NOT DETECTED NOT DETECTED Final  ?  Comment: (NOTE) ?The GeneXpert MRSA Assay (FDA approved for NASAL specimens only), ?is one component of a comprehensive MRSA colonization surveillance ?program. It is not intended to diagnose MRSA infection nor to guide ?or monitor treatment for MRSA infections. ?Test performance is not FDA approved in patients less than 2 years ?old. ?Performed at [facility], [place] [address], [place], [place] ?[address] ?  ?Culture, blood (routine x 2)     Status: None (Preliminary result)  ? Collection Time: 08/21/21 10:16 AM  ? Specimen: BLOOD  ?Result Value Ref Range Status  ?  Specimen Description BLOOD BLOOD LEFT FOREARM  Final  ? Special Requests   Final  ?  BOTTLES DRAWN AEROBIC ONLY Blood Culture results may not be optimal due to an inadequate volume of blood received in culture bottles  ? Culture   Final  ?  NO GROWTH 4 DAYS ?Performed at [facility], [place] [address], [place], [place] [address] ?  ? Report Status PENDING  Incomplete  ?Culture, blood (routine x 2)     Status: None (Preliminary result)  ? Collection Time: 08/21/21 10:18 AM  ? Specimen: BLOOD  ?Result Value Ref Range Status  ? Specimen Description BLOOD BLOOD RIGHT HAND  Final  ? Special Requests   Final  ?  BOTTLES DRAWN AEROBIC ONLY Blood Culture results may not be optimal due to an inadequate volume of blood received in culture bottles  ? Culture   Final  ?  NO GROWTH 4 DAYS ?Performed at [facility], [place] [address] [place],  [address] ?  ? Report Status PENDING  Incomplete  ?Urine Culture     Status: Abnormal  ? Collection Time: 08/21/21  3:04 PM  ? Specimen: Urine, Clean Catch  ?Result Value Ref Range Status  ? Specimen Description URINE, CLEAN CATCH  Final  ? Special Requests   Final  ?  NONE ?Performed at [facility], [place] [address] [place], [place] [address] ?  ? Culture MULTIPLE SPECIES PRESENT, SUGGEST RECOLLECTION (A)  Final  ? Report Status 08/23/2021 FINAL  Final  ?  ? ? ?Radiology Studies: ?MR ABDOMEN MRCP W WO CONTAST ? ?Result Date: 08/25/2021 ?CLINICAL DATA:  Right upper quadrant abdominal pain, elevated LFTs, dilated biliary duct status post cholecystectomy, nondiagnostic ultrasound EXAM: MRI ABDOMEN WITHOUT AND WITH CONTRAST (INCLUDING MRCP) TECHNIQUE: Multiplanar multisequence MR imaging of the abdomen was performed both before and after the administration of intravenous contrast. Heavily T2-weighted images of the biliary and pancreatic ducts were obtained, and three-dimensional MRCP images were rendered by post processing. CONTRAST:  88m GADAVIST GADOBUTROL 1 MMOL/ML IV  SOLN COMPARISON:  Right upper quadrant ultrasound, 08/21/2021 FINDINGS: Lower chest: Small pericardial effusion. Trace bilateral pleural effusions. Hepatobiliary: No mass or other parenchymal abnormality identified

## 2021-08-27 ENCOUNTER — Other Ambulatory Visit (HOSPITAL_COMMUNITY): Payer: Self-pay

## 2021-08-27 DIAGNOSIS — R7881 Bacteremia: Secondary | ICD-10-CM | POA: Diagnosis not present

## 2021-08-27 DIAGNOSIS — R579 Shock, unspecified: Secondary | ICD-10-CM | POA: Diagnosis not present

## 2021-08-27 DIAGNOSIS — I5041 Acute combined systolic (congestive) and diastolic (congestive) heart failure: Secondary | ICD-10-CM | POA: Diagnosis not present

## 2021-08-27 DIAGNOSIS — N179 Acute kidney failure, unspecified: Secondary | ICD-10-CM | POA: Diagnosis not present

## 2021-08-27 LAB — BASIC METABOLIC PANEL
Anion gap: 5 (ref 5–15)
BUN: 21 mg/dL (ref 8–23)
CO2: 27 mmol/L (ref 22–32)
Calcium: 7.9 mg/dL — ABNORMAL LOW (ref 8.9–10.3)
Chloride: 105 mmol/L (ref 98–111)
Creatinine, Ser: 1.13 mg/dL — ABNORMAL HIGH (ref 0.44–1.00)
GFR, Estimated: 49 mL/min — ABNORMAL LOW (ref >60)
Glucose, Bld: 128 mg/dL — ABNORMAL HIGH (ref 70–99)
Potassium: 4 mmol/L (ref 3.5–5.1)
Sodium: 137 mmol/L (ref 135–145)

## 2021-08-27 LAB — CBC
HCT: 24.9 % — ABNORMAL LOW (ref 36.0–46.0)
Hemoglobin: 8.2 g/dL — ABNORMAL LOW (ref 12.0–15.0)
MCH: 30.8 pg (ref 26.0–34.0)
MCHC: 32.9 g/dL (ref 30.0–36.0)
MCV: 93.6 fL (ref 80.0–100.0)
Platelets: 103 10*3/uL — ABNORMAL LOW (ref 150–400)
RBC: 2.66 MIL/uL — ABNORMAL LOW (ref 3.87–5.11)
RDW: 15 % (ref 11.5–15.5)
WBC: 12.7 10*3/uL — ABNORMAL HIGH (ref 4.0–10.5)
nRBC: 0 % (ref 0.0–0.2)

## 2021-08-27 MED ORDER — CEFDINIR 300 MG PO CAPS
300.0000 mg | ORAL_CAPSULE | Freq: Two times a day (BID) | ORAL | Status: DC
  Administered 2021-08-27: 300 mg via ORAL
  Filled 2021-08-27: qty 1

## 2021-08-27 MED ORDER — APIXABAN 5 MG PO TABS
ORAL_TABLET | ORAL | 0 refills | Status: DC
  Filled 2021-08-27: qty 66, 30d supply, fill #0

## 2021-08-27 MED ORDER — METOPROLOL SUCCINATE ER 25 MG PO TB24
25.0000 mg | ORAL_TABLET | Freq: Every day | ORAL | 0 refills | Status: DC
  Filled 2021-08-27: qty 30, 30d supply, fill #0

## 2021-08-27 MED ORDER — CEFDINIR 300 MG PO CAPS
300.0000 mg | ORAL_CAPSULE | Freq: Two times a day (BID) | ORAL | 0 refills | Status: DC
Start: 2021-08-27 — End: 2021-09-03
  Filled 2021-08-27: qty 8, 4d supply, fill #0

## 2021-08-27 NOTE — TOC Progression Note (Addendum)
Transition of Care (TOC) - Progression Note  ? ? ?Patient Details  ?Name: Christy Stephenson ?MRN: [id number] ?Date of Birth: 1940/05/30 ? ?Transition of Care (TOC) CM/SW Contact  ?Zenon Mayo, RN ?Phone Number: ?08/27/2021, 8:50 AM ? ?Clinical Narrative:    ?NCM contacted [facility] and spoke with [place] at [phone] to add a Education officer, museum to services. She states she will.  ? ?1029 NCM received information from physical therapist that patient wants to go to SNF and she qualifies, with  her bp dropping and walking 15 feet.  She states her knees buckled today when working with her. NCM informed Janett Billow CSW regarding this information.  ? ? ?Expected Discharge Plan: [place] ?Barriers to Discharge: Continued Medical Work up ? ?Expected Discharge Plan and Services ?Expected Discharge Plan: [place] ?  ?Discharge Planning Services: CM Consult ?Post Acute Care Choice: Home Health ?  ?                ?  ?DME Agency: NA ?  ?  ?  ?HH Arranged: PT, OT ?[place] Agency: [facility] ?Date HH Agency Contacted: 08/24/21 ?Time [place]: [id number] ?Representative spoke with at [place]: [place] ? ? ?Social Determinants of Health (SDOH) Interventions ?  ? ?Readmission Risk Interventions ?   ? View : No data to display.  ?  ?  ?  ? ? ?

## 2021-08-27 NOTE — Plan of Care (Signed)

## 2021-08-27 NOTE — Care Management Important Message (Signed)
Important Message ? ?Patient Details  ?Name: Christy Stephenson ?MRN: [id number] ?Date of Birth: [birth year] ? ? ?Medicare Important Message Given:  Yes ? ? ? ? ?Shelda Altes ?08/27/2021, 8:40 AM ?

## 2021-08-27 NOTE — Plan of Care (Signed)
?  Problem: Clinical Measurements: ?Goal: Will remain free from infection ?Outcome: Completed/Met ?  ?

## 2021-08-27 NOTE — TOC Transition Note (Addendum)
Transition of Care (TOC) - CM/SW Discharge Note ? ? ?Patient Details  ?Name: Christy Stephenson ?MRN: [id number] ?Date of Birth: 1940-11-05 ? ?Transition of Care (TOC) CM/SW Contact:  ?Zenon Mayo, RN ?Phone Number: ?08/27/2021, 1:13 PM ? ? ?Clinical Narrative:    ?Patient is for dc today, she states her spouse will be transporting her home today.  NCM informed her that she will have HHPT, [place] and SW with [facility] and the soc will begin on Monday or Tuesday per [facility] rep.  Patient states ok.  NCM notified [place] of dc today. ? ? ?Final next level of care: [place] ?Barriers to Discharge: Continued Medical Work up ? ? ?Patient Goals and CMS Choice ?Patient states their goals for this hospitalization and ongoing recovery are:: return home with [facility] ?CMS Medicare.gov Compare Post Acute Care list provided to:: Patient ?Choice offered to / list presented to : Patient ? ?Discharge Placement ?  ?           ?  ?  ?  ?  ? ?Discharge Plan and Services ?  ?Discharge Planning Services: CM Consult ?Post Acute Care Choice: Home Health          ?  ?DME Agency: NA ?  ?  ?  ?HH Arranged: PT, OT ?[place] Agency: [place] ?Date HH Agency Contacted: 08/24/21 ?Time [place]: [id number] ?Representative spoke with at [place]: [place] ? ?Social Determinants of Health (SDOH) Interventions ?  ? ? ?Readmission Risk Interventions ?   ? View : No data to display.  ?  ?  ?  ? ? ? ? ? ?

## 2021-08-27 NOTE — Progress Notes (Signed)
Physical Therapy Treatment ?Patient Details ?Name: Christy Stephenson ?MRN: [id number] ?DOB: March 25, 1941 ?Today's Date: 08/27/2021 ? ? ?History of Present Illness Pt is a 81 y/o female with presented with fever, weakness, dyspnea, and drainage from surgical incision (back). Chest CT negative for PE, Doppler negative for DVT despite elevated d.dimer. Pt admitted to ICU for septic shock and AKI. Recent admission 3/21 for lumbar decompression/fusion. PMH: asthma, prior back surgery. cardiac cath, R TKA. ? ?  ?PT Comments  ? ? Pt with slight regression in mobility tolerance over weekend, completing multiple short bouts of walking (limited to 5-15 ft) before needing seated rest, but needing chair follow for safe progression. The pt's BP remained stable with pt reporting asymptomatic. She remains able to complete bed mobility with minA and sit-stand transfers with minG for safety and line management. In discussion with the pt and her spouse, she requested SNF for short stint of rehab before returning home. We discussed how home with intermittent support and HHPT to progress strength would be a safe option given she has good support and is currently mobilizing short bouts with minG, but pt requesting SNF placement. Have notified TOC. Will continue to progress pt as able with acute PT to build strength, endurance, and confidence.  ?   ?Recommendations for follow up therapy are one component of a multi-disciplinary discharge planning process, led by the attending physician.  Recommendations may be updated based on patient status, additional functional criteria and insurance authorization. ? ?Follow Up Recommendations ? Home health PT (pt requesting SNF)  ?  ?  ?Assistance Recommended at Discharge Intermittent Supervision/Assistance  ?Patient can return home with the following A little help with walking and/or transfers;A little help with bathing/dressing/bathroom;Assistance with cooking/housework;Assist for transportation;Help with  stairs or ramp for entrance ?  ?Equipment Recommendations ? None recommended by PT  ?  ?Recommendations for Other Services   ? ? ?  ?Precautions / Restrictions Precautions ?Precautions: Fall;Back ?Precaution Booklet Issued: Yes (comment) ?Precaution Comments: back sx 08/07/21 with LSO brace, bladder urgency ?Required Braces or Orthoses: Spinal Brace ?Spinal Brace: Lumbar corset ?Restrictions ?Weight Bearing Restrictions: No ?Other Position/Activity Restrictions: watch orthostatic  ?  ? ?Mobility ? Bed Mobility ?Overal bed mobility: Needs Assistance ?Bed Mobility: Sidelying to Sit, Rolling ?Rolling: Min assist ?Sidelying to sit: Min assist ?  ?  ?  ?General bed mobility comments: minA with cues to complete log roll, bring LE off EOB, and elevate trunk ?  ? ?Transfers ?Overall transfer level: Needs assistance ?Equipment used: Rolling walker (2 wheels) ?Transfers: Sit to/from Stand ?Sit to Stand: Min guard ?  ?  ?  ?  ?  ?General transfer comment: minG for sit-stand but pt slow to rise and lower. ?  ? ?Ambulation/Gait ?Ambulation/Gait assistance: Min guard ?Gait Distance (Feet): 5 Feet (+ 5 ft + 15 ft + 15 ft) ?Assistive device: Rolling walker (2 wheels) ?Gait Pattern/deviations: Step-through pattern, Decreased stride length, Trunk flexed (requries postural reminders due to spinal surgery) ?  ?  ?  ?General Gait Details: minG for safety, close chair follow. pt reporting fatigue and "hot" at times asking to sit. BP stable with gait ? ? ? ? ?  ?Balance Overall balance assessment: Needs assistance ?Sitting-balance support: Feet supported ?Sitting balance-Leahy Scale: Fair ?  ?  ?Standing balance support: Bilateral upper extremity supported, During functional activity ?Standing balance-Leahy Scale: Poor ?  ?  ?  ?  ?  ?  ?  ?  ?  ?  ?  ?  ?  ? ?  ?  Cognition Arousal/Alertness: Awake/alert ?Behavior During Therapy: [facility] for tasks assessed/performed ?Overall Cognitive Status: Within Functional Limits for tasks assessed ?  ?  ?   ?  ?  ?  ?  ?  ?  ?  ?  ?  ?  ?  ?  ?  ?General Comments: chatty and positive ?  ?  ? ?  ?Exercises   ? ?  ?General Comments General comments (skin integrity, edema, etc.): dressing on incision saturated. BP in sitting: 109/59 (75) BP post amb: 122/47 (70); BP post 15 ft: 118/52 (71); BP end of session: 104/45 (63) ?  ?  ? ?Pertinent Vitals/Pain Pain Assessment ?Pain Assessment: Faces ?Faces Pain Scale: Hurts little more ?Pain Location: low back/hips ?Pain Descriptors / Indicators: Guarding ?Pain Intervention(s): Monitored during session  ? ? ? ?PT Goals (current goals can now be found in the care plan section) Acute Rehab PT Goals ?Patient Stated Goal: return home ?PT Goal Formulation: With patient ?Time For Goal Achievement: 09/06/21 ?Potential to Achieve Goals: Good ?Progress towards PT goals: Progressing toward goals ? ?  ?Frequency ? ? ? Min 3X/week ? ? ? ?  ?PT Plan Discharge plan needs to be updated  ? ? ?   ?AM-PAC PT "6 Clicks" Mobility   ?Outcome Measure ? Help needed turning from your back to your side while in a flat bed without using bedrails?: A Little ?Help needed moving from lying on your back to sitting on the side of a flat bed without using bedrails?: A Little ?Help needed moving to and from a bed to a chair (including a wheelchair)?: A Little ?Help needed standing up from a chair using your arms (e.g., wheelchair or bedside chair)?: A Little ?Help needed to walk in hospital room?: A Little ?Help needed climbing 3-5 steps with a railing? : A Lot ?6 Click Score: 17 ? ?  ?End of Session Equipment Utilized During Treatment: Back brace;Gait belt ?Activity Tolerance: Patient limited by fatigue;Treatment limited secondary to medical complications (Comment) ?Patient left: in chair;with call bell/phone within reach;with chair alarm set ?Nurse Communication: Mobility status ?PT Visit Diagnosis: Unsteadiness on feet (R26.81);Other abnormalities of gait and mobility (R26.89);Muscle weakness (generalized)  (M62.81) ?Pain - part of body:  (back) ?  ? ? ?Time: 0762-2633 ?PT Time Calculation (min) (ACUTE ONLY): 37 min ? ?Charges:  $Therapeutic Exercise: 23-37 mins          ?          ? ?West Carbo, PT, DPT  ? ?Acute Rehabilitation Department ?Pager #: [phone] - [phone] ? ? ?Sandra Cockayne ?08/27/2021, 12:13 PM ? ?

## 2021-08-27 NOTE — Progress Notes (Addendum)
? ?Progress Note ? ?Patient Name: Christy Stephenson ?Date of Encounter: 08/27/2021 ? ?[facility] Cardiologist: Quay Burow, MD  ? ?Subjective  ? ?Getting up with PT this morning. Planned for discharge today.  ? ?Inpatient Medications  ?  ?Scheduled Meds: ? apixaban  10 mg Oral BID  ? Followed by  ? [START ON 08/30/2021] apixaban  5 mg Oral BID  ? aspirin  81 mg Oral Daily  ? cefadroxil  1,000 mg Oral Q12H  ? Chlorhexidine Gluconate Cloth  6 each Topical Q0600  ? cholestyramine  4 g Oral Daily  ? fluticasone furoate-vilanterol  1 puff Inhalation Daily  ? And  ? umeclidinium bromide  1 puff Inhalation Daily  ? metoprolol succinate  25 mg Oral Daily  ? pantoprazole  40 mg Oral Q1200  ? pregabalin  100 mg Oral q morning  ? pregabalin  200 mg Oral QPM  ? sodium chloride flush  10-40 mL Intracatheter Q12H  ? ?Continuous Infusions: ? sodium chloride Stopped (08/21/21 0737)  ? ?PRN Meds: ?acetaminophen, albuterol, diclofenac Sodium, docusate sodium, LORazepam, melatonin, menthol-cetylpyridinium, methocarbamol, ondansetron (ZOFRAN) IV, oxyCODONE, polyethylene glycol, sodium chloride flush, white petrolatum  ? ?Vital Signs  ?  ?Vitals:  ? 08/26/21 1127 08/26/21 2031 08/26/21 2225 08/27/21 0355  ?BP: (!) 104/43 111/64  (!) 114/47  ?Pulse: 76 82 80 73  ?Resp: '17 17 16 14  '$ ?Temp: 97.9 ?F (36.6 ?C) 98.7 ?F (37.1 ?C)  98.1 ?F (36.7 ?C)  ?TempSrc: Oral Oral  Oral  ?SpO2: 100% 99% 98% 96%  ?Weight:    104.1 kg  ?Height:      ? ? ?Intake/Output Summary (Last 24 hours) at 08/27/2021 0931 ?Last data filed at 08/27/2021 0500 ?Gross per 24 hour  ?Intake 436 ml  ?Output 1150 ml  ?Net -714 ml  ? ? ?  08/27/2021  ?  3:55 AM 08/26/2021  ?  4:17 AM 08/25/2021  ?  3:48 AM  ?Last 3 Weights  ?Weight (lbs) 229 lb 8 oz 230 lb 13.2 oz 232 lb 5.8 oz  ?Weight (kg) 104.1 kg 104.7 kg 105.4 kg  ?   ? ?Telemetry  ?  ?SR - Personally Reviewed ? ?ECG  ?  ?No new tracing ? ?Physical Exam  ? ?GEN: No acute distress.   ?Neck: No JVD ?Cardiac: RRR, no murmurs,  rubs, or gallops.  ?Respiratory: Clear to auscultation bilaterally. ?GI: Soft, nontender, non-distended  ?MS: No edema; No deformity. ?Neuro:  Nonfocal  ?Psych: Normal affect  ? ?Labs  ?  ?High Sensitivity Troponin:   ?Recent Labs  ?Lab 08/21/21 ?1246 08/21/21 ?1551 08/21/21 ?1751  ?TROPONINIHS 1,191* [id number]* 3,185*  ?   ?Chemistry ?Recent Labs  ?Lab 08/22/21 ?0905 08/22/21 ?1638 08/23/21 ?4782 08/24/21 ?0259 08/25/21 ?9562 08/26/21 ?0957 08/27/21 ?1308  ?NA 137   < > 140 139 139 138 137  ?K 3.5   < > 4.2 3.4* 3.8 3.5 4.0  ?CL 103   < > 108 105 103 103 105  ?CO2 23   < > '26 27 29 26 27  '$ ?GLUCOSE 109*   < > 108* 100* 105* 126* 128*  ?BUN 39*   < > 38* 35* 30* 21 21  ?CREATININE 2.07*   < > 1.67* 1.53* 1.34* 1.17* 1.13*  ?CALCIUM 7.5*   < > 7.9* 7.9* 7.9* 7.8* 7.9*  ?MG 1.5*  --  2.5*  --   --   --   --   ?PROT 4.6*  --  4.6* 4.4*  4.7*  --   --   ?ALBUMIN 1.9*  --  1.8* 1.6* 1.7*  --   --   ?AST 51*  --  39 26 17  --   --   ?ALT 58*  --  55* 39 28  --   --   ?ALKPHOS 292*  --  264* 264* 234*  --   --   ?BILITOT 3.1*  --  1.9* 1.3* 1.1  --   --   ?GFRNONAA 24*   < > 31* 34* 40* 47* 49*  ?ANIONGAP 11   < > '6 7 7 9 5  '$ ? < > = values in this interval not displayed.  ?  ?Lipids No results for input(s): CHOL, TRIG, HDL, LABVLDL, LDLCALC, CHOLHDL in the last 168 hours.  ?Hematology ?Recent Labs  ?Lab 08/25/21 ?0729 08/26/21 ?1829 08/27/21 ?9371  ?WBC 11.0* 11.4* 12.7*  ?RBC 2.89* 2.79* 2.66*  ?HGB 8.9* 8.3* 8.2*  ?HCT 26.8* 26.2* 24.9*  ?MCV 92.7 93.9 93.6  ?MCH 30.8 29.7 30.8  ?MCHC 33.2 31.7 32.9  ?RDW 15.2 14.9 15.0  ?PLT 63* 74* 103*  ? ?Thyroid  ?Recent Labs  ?Lab 08/21/21 ?1246 08/21/21 ?1551  ?TSH 0.298*  --   ?FREET4  --  1.08  ?  ?BNP ?Recent Labs  ?Lab 08/21/21 ?1246 08/25/21 ?0729  ?BNP 1,198.5* 334.4*  ?  ?DDimer No results for input(s): DDIMER in the last 168 hours.  ? ?Radiology  ?  ?No results found. ? ?Cardiac Studies  ? ?Echo: 08/21/21 ? ?IMPRESSIONS  ? ? ? 1. Left ventricular ejection fraction, by estimation,  is 30 to 35%. Left  ?ventricular ejection fraction by 3D volume is 35 %. The left ventricle has  ?moderately decreased function. The left ventricle demonstrates global  ?hypokinesis. There is mild left  ?ventricular hypertrophy. Left ventricular diastolic parameters are  ?consistent with Grade I diastolic dysfunction (impaired relaxation).  ? 2. Right ventricular systolic function is mildly reduced. The right  ?ventricular size is mildly enlarged. There is moderately elevated  ?pulmonary artery systolic pressure.  ? 3. The mitral valve is normal in structure. Mild to moderate mitral valve  ?regurgitation. No evidence of mitral stenosis.  ? 4. Tricuspid valve regurgitation is mild to moderate.  ? 5. The aortic valve is normal in structure. Aortic valve regurgitation is  ?not visualized. No aortic stenosis is present.  ? ?FINDINGS  ? Left Ventricle: No left ventricular thrombus seen (Definity contrast was  ?used). Left ventricular ejection fraction, by estimation, is 30 to 35%.  ?Left ventricular ejection fraction by 3D volume is 35 %. The left  ?ventricle has moderately decreased  ?function. The left ventricle demonstrates global hypokinesis. Definity  ?contrast agent was given IV to delineate the left ventricular endocardial  ?borders. The left ventricular internal cavity size was normal in size.  ?There is mild left ventricular  ?hypertrophy. Left ventricular diastolic parameters are consistent with  ?Grade I diastolic dysfunction (impaired relaxation). Normal left  ?ventricular filling pressure.  ? ?Right Ventricle: The right ventricular size is mildly enlarged. No  ?increase in right ventricular wall thickness. Right ventricular systolic  ?function is mildly reduced. There is moderately elevated pulmonary artery  ?systolic pressure. The tricuspid  ?regurgitant velocity is 3.03 m/s, and with an assumed right atrial  ?pressure of 15 mmHg, the estimated right ventricular systolic pressure is  ?69.6 mmHg.  ? ?Left  Atrium: Left atrial size was normal in size.  ? ?Right Atrium: Right atrial size was  normal in size.  ? ?Pericardium: There is no evidence of pericardial effusion.  ? ?Mitral Valve: The mitral valve is normal in structure. Mild to moderate  ?mitral valve regurgitation, with centrally-directed jet. No evidence of  ?mitral valve stenosis.  ? ?Tricuspid Valve: The tricuspid valve is normal in structure. Tricuspid  ?valve regurgitation is mild to moderate. No evidence of tricuspid  ?stenosis.  ? ?Aortic Valve: The aortic valve is normal in structure. Aortic valve  ?regurgitation is not visualized. No aortic stenosis is present.  ? ?Pulmonic Valve: The pulmonic valve was normal in structure. Pulmonic valve  ?regurgitation is not visualized. No evidence of pulmonic stenosis.  ? ?Aorta: The aortic root is normal in size and structure.  ? ?IAS/Shunts: No atrial level shunt detected by color flow Doppler.  ? ?Patient Profile  ?   ?81 y.o. female with PMH of TIA in 2014, asthma, OSA on CPAP, COVID 03/2021, idiopathic progressive polyneuropathy, memory loss and morbid obesity who presented with septic shock with + e coli bacteremia treated with abx (presumed urinary source). Found to have a PE on VQ scan and new LV dysfunction (30-35%). Recently underwent lumbar surgery. Previously had normal cors on cath in 2014 with occluded innominate artery. ? ?Assessment & Plan  ?  ?New LV dysfunction: Echo showed EF 30-35%, grade 1 DD in the setting PE and sepsis. Baseline chronic dyspnea due to severe asthma, denied any recent chest pain. hsTn 1661 --> 3397 --> 3185. normal cors in 2014 after abnormal myoview, plan for outpatient coronary CTA (patient has no problem laying flat) and repeat echo in 3 month.  ?-- GDMT limited in the setting of hypotension, tolerating Toprol '25mg'$  daily. Would plan to optimize as an outpatient ?  ?PE: VQ scan on 08/21/2021 showed moderate sized areas of decreased perfusion in the posterior left mid and left  lower lung fields.  ?-- PE dosing of Eliquis ?-- would drop ASA with the need for Eliquis ?  ?Septic shock: e coli bacteremia, presumed urinary source. Resolved. Off pressors. ?-- BP still borderline but to

## 2021-08-27 NOTE — Progress Notes (Signed)
Patient discharged to home with home health. IV and telemetry removed. Discharge instructions explained and given to patient. ?

## 2021-08-27 NOTE — Discharge Summary (Signed)
? ? ? ? ?Physician Discharge Summary  ?Christy Stephenson:[id number] DOB: [birth year] DOA: 08/21/2021 ? ?PCP: Seward Carol, MD ? ?Admit date: 08/21/2021 ?Discharge date: 08/27/2021 ? ?Admitted From: home ?Discharge disposition: home ? ? ?Recommendations for Outpatient Follow-Up:  ? ?Home health- social worker added in case patient does not thrive at home ?Repeat TSH/free t4 as outpatient  ?Eliquis started ?Cards to complete outpatient work up for decreased EF ? ? ?Discharge Diagnosis:  ? ?Principal Problem: ?  Shock ([place]) ?Active Problems: ?  Acute respiratory failure with hypoxia ([place]) ?  AKI (acute kidney injury) ([place]) ?  Nausea ?  Septic shock ([place]) ?  Bacteremia ?  Hyponatremia ?  Acute combined systolic and diastolic congestive heart failure ([place]) ? ? ? ?Discharge Condition: Improved. ? ?Diet recommendation: Low sodium, heart healthy ? ?Wound care: None. ? ?Code status: Full. ? ? ?History of Present Illness:  ? ?Christy Stephenson is a 81 y.o. female with a history of idiopathic progressive polyneuropathy, asthma, OSA on CPAP, memory loss, asthma. Patient presented from an outside hospital for septic shock. She required admission to the ICU for vasopressor support. Managed on empiric Cefepime > Ceftriaxone. Associated AKI and elevated troponin. Blood cultures significant for E. Coli, presumably from urinary source.  ?  ?ICU Course: ?4/4 admit to [facility]; started on heparin gtt for possible PE; V/Q w/ possible ?4/5: remains on levo ?4/6: off levo ? ? ?Hospital Course by Problem:  ? ?Septic shock secondary to E. Coli bacteremia ?Presumed secondary to urinary source. Patient required vasopressor support which has been weaned off. Empiric Cefepime initiated and was transitioned to Ceftriaxone. BCID on outside hospital records indicate E. Coli infection. Discussed with outside hospital, who will fax over updated culture data as this is not viewable via Care Everywhere ?-Change ceftriaxone to PO abx to finish treatment ?  ?Acute  respiratory failure with hypoxia ?In setting of likely PE. Patient is now on room air. ?  ?Probable acute PE ?V/Q inconclusive but significant for moderate decreased perfusion in posterior left mid/lower lobes. Provoked in setting of recent surgery. Transthoracic Echocardiogram significant for mildly reduced RV function with mild enlargement. Associated elevated troponin as mentioned below. Started on Heparin IV and transitioned to Eliquis on 4/6. ?-Continue Eliquis for now ?  ?Acute combined systolic and diastolic heart failure ?Appears to be a new diagnosis. Prior cardiac catheterization from 2014 without ischemic disease. Transthoracic Echocardiogram this admission shows EF of 30-35% with grade 1 diastolic dysfunction and LV global hypokinesis. Complicated by elevated troponin as mentioned below. Currently without symptoms of chest pain. ?-Cardiology following for outpatient work up ?  ?Elevated troponin ?In setting of critical illness; acute heart failure in setting of sepsis. Troponin trend of 1,661 > 3,397 > 3,185. No chest pain. EKG on admission without obvious evidence of ischemia. Transthoracic Echocardiogram results as mentioned above. ?-cards following with outpatient work up planned ?  ?AKI ?Baseline creatinine of about 1. Creatinine on admission of 2.78. Likely secondary to above infection/sepsis. -resolved ?  ?Acute anemia ?Baseline hemoglobin of about 14. Anemia appears to be secondary to prior spine surgery. Hemoglobin currently stable. ?  ?Hyponatremia ?Mild. Resolved. ?  ?Hyperbilirubinemia ?Elevated AST/ALT/ALP ?Unsure of etiology but possibly related to sepsis with shock.  ?-MRCP w/o obstruction ?  ?Subclinical hypothyroidism ?Low TSH of 0.298 with normal free T4 of 1.08.  ?-Recommend repeat TSH/free T4 as an outpatient ?  ?History of recent back surgery ?Neurosurgery consulted and evaluated. Per their assessment, bacteremia is unlikely  from surgical wound. ?  ?Asthma ?-Continue Breo Ellipta and  Incruse Ellipta ?  ?Idiopathic progressive polyneuropathy ?-Continue Lyrica ?  ?OSA ?-Continue CPAP ? ? ? ?Medical Consultants:  ? ?Cards ?NS ?PCCM ? ? ?Discharge Exam:  ? ?Vitals:  ? 08/27/21 0355 08/27/21 1055  ?BP: (!) 114/47 (!) 105/48  ?Pulse: 73 73  ?Resp: 14 14  ?Temp: 98.1 ?F (36.7 ?C) 99.1 ?F (37.3 ?C)  ?SpO2: 96% 100%  ? ?Vitals:  ? 08/26/21 2031 08/26/21 2225 08/27/21 0355 08/27/21 1055  ?BP: 111/64  (!) 114/47 (!) 105/48  ?Pulse: 82 80 73 73  ?Resp: '17 16 14 14  '$ ?Temp: 98.7 ?F (37.1 ?C)  98.1 ?F (36.7 ?C) 99.1 ?F (37.3 ?C)  ?TempSrc: Oral  Oral Oral  ?SpO2: 99% 98% 96% 100%  ?Weight:   104.1 kg   ?Height:      ? ? ?General exam: Appears calm and comfortable.  ? ? ? ?The results of significant diagnostics from this hospitalization (including imaging, microbiology, ancillary and laboratory) are listed below for reference.   ? ? ?Procedures and Diagnostic Studies:  ? ?NM Pulmonary Perfusion ? ?Result Date: 08/21/2021 ?CLINICAL DATA:  Generalized weakness, elevated D-dimer recent lumbar spine surgery EXAM: NUCLEAR MEDICINE PERFUSION LUNG SCAN TECHNIQUE: Perfusion images were obtained in multiple projections after intravenous injection of radiopharmaceutical. Ventilation scans intentionally deferred if perfusion scan and chest x-ray adequate for interpretation during COVID 19 epidemic. RADIOPHARMACEUTICALS:  4.2 mCi Tc-44mMAA IV COMPARISON:  Chest radiograph done earlier today FINDINGS: There is moderate sized area of decreased perfusion in the posterior left mid and left lower lung fields. There is no matching infiltrate in the chest radiograph. There is no definite demonstrable wedge-shaped perfusion defects. IMPRESSION: Moderate sized areas of decreased perfusion are noted in the posterior left mid and left lower lung fields. The study is inconclusive to evaluate for pulmonary embolism. If there is continued clinical suspicion for pulmonary embolism, please consider venous Doppler examination of both lower  extremities and possibly CT angiogram. Electronically Signed   By: PElmer PickerM.D.   On: 08/21/2021 14:23  ? ?DG CHEST PORT 1 VIEW ? ?Result Date: 08/21/2021 ?CLINICAL DATA:  Shortness of breath. EXAM: PORTABLE CHEST 1 VIEW COMPARISON:  Portable chest earlier today at 9:23 a.m. FINDINGS: 9:52 p.m., 08/21/2021. A left IJ central line again has its tip at the level along the brachiocephalic/SVC junction. The heart is enlarged. There is mild central vascular prominence without edema. There is a streaky opacity developed in the left lower lobe infrahilar area concerning for pneumonia or aspiration, alternatively could be atelectasis but the lungs are at least as well inflated as previously. Linear scarring or atelectasis is again noted left mid field, old granulomatous disease right mid field. The remaining lungs are clear.  No pleural effusion is seen. Thoracic spondylosis. Stable mediastinum with aortic tortuosity and atherosclerosis. IMPRESSION: 1. Concern for developing infiltrate or streaky atelectasis versus aspiration, left lower lobe infrahilar area. 2. Stable left IJ central line placement. 3. Cardiomegaly. 4. Aortic atherosclerosis. Electronically Signed   By: KTelford NabM.D.   On: 08/21/2021 22:01  ? ?DG CHEST PORT 1 VIEW ? ?Result Date: 08/21/2021 ?CLINICAL DATA:  Shortness breath EXAM: PORTABLE CHEST 1 VIEW COMPARISON:  Chest x-ray dated May 07, 2021; chest CT dated May 15, 2021 FINDINGS: Cardiac and mediastinal contours are unchanged. Left IJ line with tip projecting over the confluence of the brachiocephalic vein and SVC. Stable calcified nodule the right mid lung. Possibly near  opacity of the mid left lung, likely due to scarring or atelectasis when compared with most recent prior chest CT. Lungs otherwise clear. Old left-sided rib fractures. No evidence pleural effusion or pneumothorax. IMPRESSION: Left IJ line with tip projecting over the confluence of the brachiocephalic vein and  SVC. Electronically Signed   By: Yetta Glassman M.D.   On: 08/21/2021 09:31  ? ?ECHOCARDIOGRAM COMPLETE ? ?Result Date: 08/21/2021 ?   ECHOCARDIOGRAM REPORT   Patient Name:   Clois Comber Date of Exam

## 2021-08-27 NOTE — TOC Progression Note (Signed)
Transition of Care (TOC) - Progression Note  ? ? ?Patient Details  ?Name: Christy Stephenson ?MRN: [id number] ?Date of Birth: [birth year] ? ?Transition of Care (TOC) CM/SW Contact  ?Coralee Pesa, LCSWA ?Phone Number: ?08/27/2021, 1:07 PM ? ?Clinical Narrative:    ? ?CSW was notified that pt is requesting skilled rehab at  [facility]. Per the PT notes pt is not appropriate at this time. CSW followed up with MD who is also in agreement with plan. CSW spoke with [facility] who noted they do not have beds today and they have two boarders in the ED. They are concerned as well that insurance won't pay for the stay as she does not meet criteria. CSW  notified pt and medical team. CSW to sign off. ? ?Expected Discharge Plan: [place] ?Barriers to Discharge: Continued Medical Work up ? ?Expected Discharge Plan and Services ?Expected Discharge Plan: [place] ?  ?Discharge Planning Services: CM Consult ?Post Acute Care Choice: Home Health ?  ?Expected Discharge Date: 08/27/21               ?  ?DME Agency: NA ?  ?  ?  ?HH Arranged: PT, OT ?[place] Agency: [place] ?Date HH Agency Contacted: 08/24/21 ?Time [place]: [id number] ?Representative spoke with at [place]: [place] ? ? ?Social Determinants of Health (SDOH) Interventions ?  ? ?Readmission Risk Interventions ?   ? View : No data to display.  ?  ?  ?  ? ? ?

## 2021-08-28 DIAGNOSIS — G603 Idiopathic progressive neuropathy: Secondary | ICD-10-CM | POA: Diagnosis not present

## 2021-08-28 DIAGNOSIS — R7881 Bacteremia: Secondary | ICD-10-CM | POA: Diagnosis not present

## 2021-08-28 DIAGNOSIS — Z4789 Encounter for other orthopedic aftercare: Secondary | ICD-10-CM | POA: Diagnosis not present

## 2021-08-28 DIAGNOSIS — I5041 Acute combined systolic (congestive) and diastolic (congestive) heart failure: Secondary | ICD-10-CM | POA: Diagnosis not present

## 2021-08-28 DIAGNOSIS — E02 Subclinical iodine-deficiency hypothyroidism: Secondary | ICD-10-CM | POA: Diagnosis not present

## 2021-08-28 DIAGNOSIS — E7800 Pure hypercholesterolemia, unspecified: Secondary | ICD-10-CM | POA: Diagnosis not present

## 2021-08-28 DIAGNOSIS — Z981 Arthrodesis status: Secondary | ICD-10-CM | POA: Diagnosis not present

## 2021-08-28 DIAGNOSIS — Z7901 Long term (current) use of anticoagulants: Secondary | ICD-10-CM | POA: Diagnosis not present

## 2021-08-28 DIAGNOSIS — J479 Bronchiectasis, uncomplicated: Secondary | ICD-10-CM | POA: Diagnosis not present

## 2021-08-28 DIAGNOSIS — N179 Acute kidney failure, unspecified: Secondary | ICD-10-CM | POA: Diagnosis not present

## 2021-08-28 DIAGNOSIS — B9620 Unspecified Escherichia coli [E. coli] as the cause of diseases classified elsewhere: Secondary | ICD-10-CM | POA: Diagnosis not present

## 2021-08-28 DIAGNOSIS — G4733 Obstructive sleep apnea (adult) (pediatric): Secondary | ICD-10-CM | POA: Diagnosis not present

## 2021-08-28 DIAGNOSIS — J439 Emphysema, unspecified: Secondary | ICD-10-CM | POA: Diagnosis not present

## 2021-08-28 DIAGNOSIS — Z79890 Hormone replacement therapy (postmenopausal): Secondary | ICD-10-CM | POA: Diagnosis not present

## 2021-08-28 DIAGNOSIS — K219 Gastro-esophageal reflux disease without esophagitis: Secondary | ICD-10-CM | POA: Diagnosis not present

## 2021-08-28 DIAGNOSIS — J45909 Unspecified asthma, uncomplicated: Secondary | ICD-10-CM | POA: Diagnosis not present

## 2021-08-28 DIAGNOSIS — D649 Anemia, unspecified: Secondary | ICD-10-CM | POA: Diagnosis not present

## 2021-08-28 DIAGNOSIS — Z8673 Personal history of transient ischemic attack (TIA), and cerebral infarction without residual deficits: Secondary | ICD-10-CM | POA: Diagnosis not present

## 2021-08-28 DIAGNOSIS — Z7951 Long term (current) use of inhaled steroids: Secondary | ICD-10-CM | POA: Diagnosis not present

## 2021-08-30 DIAGNOSIS — B9620 Unspecified Escherichia coli [E. coli] as the cause of diseases classified elsewhere: Secondary | ICD-10-CM | POA: Diagnosis not present

## 2021-08-30 DIAGNOSIS — I5041 Acute combined systolic (congestive) and diastolic (congestive) heart failure: Secondary | ICD-10-CM | POA: Diagnosis not present

## 2021-08-30 DIAGNOSIS — Z4789 Encounter for other orthopedic aftercare: Secondary | ICD-10-CM | POA: Diagnosis not present

## 2021-08-30 DIAGNOSIS — R7881 Bacteremia: Secondary | ICD-10-CM | POA: Diagnosis not present

## 2021-08-30 DIAGNOSIS — N179 Acute kidney failure, unspecified: Secondary | ICD-10-CM | POA: Diagnosis not present

## 2021-08-30 DIAGNOSIS — G603 Idiopathic progressive neuropathy: Secondary | ICD-10-CM | POA: Diagnosis not present

## 2021-08-31 DIAGNOSIS — S32009K Unspecified fracture of unspecified lumbar vertebra, subsequent encounter for fracture with nonunion: Secondary | ICD-10-CM | POA: Diagnosis not present

## 2021-08-31 DIAGNOSIS — M545 Low back pain, unspecified: Secondary | ICD-10-CM | POA: Diagnosis not present

## 2021-08-31 DIAGNOSIS — Z6839 Body mass index (BMI) 39.0-39.9, adult: Secondary | ICD-10-CM | POA: Diagnosis not present

## 2021-09-03 ENCOUNTER — Telehealth (HOSPITAL_BASED_OUTPATIENT_CLINIC_OR_DEPARTMENT_OTHER): Payer: Self-pay

## 2021-09-03 ENCOUNTER — Other Ambulatory Visit (HOSPITAL_BASED_OUTPATIENT_CLINIC_OR_DEPARTMENT_OTHER): Payer: Self-pay

## 2021-09-03 ENCOUNTER — Other Ambulatory Visit (HOSPITAL_COMMUNITY): Payer: Self-pay

## 2021-09-03 DIAGNOSIS — G603 Idiopathic progressive neuropathy: Secondary | ICD-10-CM | POA: Diagnosis not present

## 2021-09-03 DIAGNOSIS — Z4789 Encounter for other orthopedic aftercare: Secondary | ICD-10-CM | POA: Diagnosis not present

## 2021-09-03 DIAGNOSIS — N179 Acute kidney failure, unspecified: Secondary | ICD-10-CM | POA: Diagnosis not present

## 2021-09-03 DIAGNOSIS — B9620 Unspecified Escherichia coli [E. coli] as the cause of diseases classified elsewhere: Secondary | ICD-10-CM | POA: Diagnosis not present

## 2021-09-03 DIAGNOSIS — I5041 Acute combined systolic (congestive) and diastolic (congestive) heart failure: Secondary | ICD-10-CM | POA: Diagnosis not present

## 2021-09-03 DIAGNOSIS — R7881 Bacteremia: Secondary | ICD-10-CM | POA: Diagnosis not present

## 2021-09-03 MED ORDER — CEFDINIR 300 MG PO CAPS
300.0000 mg | ORAL_CAPSULE | Freq: Two times a day (BID) | ORAL | 3 refills | Status: DC
  Filled 2021-09-03: qty 30, 15d supply, fill #0

## 2021-09-03 NOTE — Telephone Encounter (Signed)
Pharmacy Transitions of Care Follow-up Telephone Call ? ?Date of discharge: 08/27/21  ?Discharge Diagnosis: Septic Shock/PE ? ?How have you been since you were released from the hospital? Patient has been slow going since leaving the hospital but otherwise ok. Had no questions or concerns about medications. Although the MD did want her to do another round of antibiotics, but it is unavailable at her local [facility]. I suggested we transfer it to [facility] outpatient pharmacy and we will mail it to her due to transportation issues.  ? ?Medication changes made at discharge: ?CHANGE how you take these medications ? ?CHANGE how you take these medications  ?Trelegy Ellipta 100-62.5-25 MCG/ACT Aepb ?Generic drug: Fluticasone-Umeclidin-Vilant ?INHALE 1 PUFF ONCE DAILY ?What changed: See the new instructions.   ? ?CONTINUE taking these medications ? ?CONTINUE taking these medications  ?albuterol 108 (90 Base) MCG/ACT inhaler ?Commonly known as: VENTOLIN HFA ?Inhale 2 puffs into the lungs every 6 (six) hours as needed for wheezing or shortness of breath.   ?CALCIUM-VITAMIN D PO   ?cefdinir 300 MG capsule ?Commonly known as: OMNICEF ?Take 1 capsule (300 mg total) by mouth every 12 (twelve) hours.   ?cholestyramine 4 g packet ?Commonly known as: QUESTRAN   ?diclofenac Sodium 1 % Gel ?Commonly known as: VOLTAREN   ?Eliquis 5 MG Tabs tablet ?Generic drug: apixaban ?Take 2 tablets ('10mg'$  total) twice daily through 08/29/21. Then on 08/30/21, take 1 tablet ('5mg'$ ) by mouth twice daily.   ?LORazepam 0.5 MG tablet ?Commonly known as: ATIVAN   ?methocarbamol 500 MG tablet ?Commonly known as: ROBAXIN ?Take 1 tablet (500 mg total) by mouth every 6 (six) hours as needed for muscle spasms.   ?metoprolol succinate 25 MG 24 hr tablet ?Commonly known as: TOPROL-XL ?Take 1 tablet (25 mg total) by mouth daily.   ?multivitamin with minerals Tabs tablet   ?NEXIUM 24HR PO   ?ondansetron 4 MG tablet ?Commonly known as: ZOFRAN ?Take 1 tablet (4 mg total) by  mouth every 6 (six) hours as needed for nausea or vomiting.   ?OVER THE COUNTER MEDICATION   ?oxyCODONE-acetaminophen 5-325 MG tablet ?Commonly known as: PERCOCET/ROXICET ?Take 1-2 tablets by mouth every 4 (four) hours as needed for moderate pain or severe pain.   ?pregabalin 100 MG capsule ?Commonly known as: LYRICA   ?SYSTANE BALANCE OP   ?vitamin C 1000 MG tablet   ?Vitamin D 125 MCG (5000 UT) Caps   ?zinc gluconate 50 MG tablet   ? ? ?Medication changes verified by the patient? yes  ? ?Medication Accessibility: ? ?Home Pharmacy: [facility]  ? ?Was the patient provided with refills on discharged medications? no  ? ?Have all prescriptions been transferred from [facility] to home pharmacy? N/a  ? ?Is the patient able to afford medications? insured ?Notable copays: n/a ?Eligible patient assistance: no ?  ? ?Medication Review: ?APIXABAN (ELIQUIS)  ?Apixaban 10 mg BID initiated on 08/23/21. Will switch to apixaban 5 mg BID after 7 days (DATE 08/30/21).  ?- Discussed importance of taking medication around the same time everyday  ?- Reviewed potential DDIs with patient  ?- Advised patient of medications to avoid (NSAIDs, ASA)  ?- Educated that Tylenol (acetaminophen) will be the preferred analgesic to prevent risk of bleeding  ?- Emphasized importance of monitoring for signs and symptoms of bleeding (abnormal bruising, prolonged bleeding, nose bleeds, bleeding from gums, discolored urine, black tarry stools)  ?- Advised patient to alert all providers of anticoagulation therapy prior to starting a new medication or having a  procedure  ? ?Follow-up Appointments: ?Date Visit Type Length Department   ? 09/21/2021  3:10 PM OFFICE VISIT 25 min [facility] [[id number]  ? ? ?If their condition worsens, is the pt aware to call PCP or go to the Emergency Dept.? yes ? ?Final Patient Assessment: ?Patient has been slow going since leaving the hospital but otherwise ok. Had no questions or concerns about medications.  Although the MD did want her to do another round of antibiotics, but it is unavailable at [facility] local [facility]. I suggested we transfer it to [facility] outpatient pharmacy and we will mail it to her due to transportation issues.  ? ?Darcus Austin, PharmD ?Clinical Pharmacist ?[facility] ?09/03/2021 12:33 PM ? ?

## 2021-09-05 ENCOUNTER — Other Ambulatory Visit: Payer: Self-pay | Admitting: Internal Medicine

## 2021-09-05 DIAGNOSIS — Z4789 Encounter for other orthopedic aftercare: Secondary | ICD-10-CM | POA: Diagnosis not present

## 2021-09-05 DIAGNOSIS — N179 Acute kidney failure, unspecified: Secondary | ICD-10-CM | POA: Diagnosis not present

## 2021-09-05 DIAGNOSIS — I5041 Acute combined systolic (congestive) and diastolic (congestive) heart failure: Secondary | ICD-10-CM | POA: Diagnosis not present

## 2021-09-05 DIAGNOSIS — R7881 Bacteremia: Secondary | ICD-10-CM | POA: Diagnosis not present

## 2021-09-05 DIAGNOSIS — B9620 Unspecified Escherichia coli [E. coli] as the cause of diseases classified elsewhere: Secondary | ICD-10-CM | POA: Diagnosis not present

## 2021-09-05 DIAGNOSIS — G603 Idiopathic progressive neuropathy: Secondary | ICD-10-CM | POA: Diagnosis not present

## 2021-09-06 DIAGNOSIS — I5041 Acute combined systolic (congestive) and diastolic (congestive) heart failure: Secondary | ICD-10-CM | POA: Diagnosis not present

## 2021-09-06 DIAGNOSIS — A419 Sepsis, unspecified organism: Secondary | ICD-10-CM | POA: Diagnosis not present

## 2021-09-06 DIAGNOSIS — R7881 Bacteremia: Secondary | ICD-10-CM | POA: Diagnosis not present

## 2021-09-06 DIAGNOSIS — I2699 Other pulmonary embolism without acute cor pulmonale: Secondary | ICD-10-CM | POA: Diagnosis not present

## 2021-09-06 DIAGNOSIS — N179 Acute kidney failure, unspecified: Secondary | ICD-10-CM | POA: Diagnosis not present

## 2021-09-07 ENCOUNTER — Encounter (HOSPITAL_COMMUNITY): Payer: Self-pay | Admitting: Neurological Surgery

## 2021-09-07 ENCOUNTER — Ambulatory Visit: Payer: Medicare Other | Admitting: Nurse Practitioner

## 2021-09-07 ENCOUNTER — Other Ambulatory Visit: Payer: Self-pay | Admitting: Neurological Surgery

## 2021-09-07 ENCOUNTER — Telehealth: Payer: Self-pay

## 2021-09-07 ENCOUNTER — Telehealth: Payer: Self-pay | Admitting: Cardiovascular Disease

## 2021-09-07 ENCOUNTER — Other Ambulatory Visit: Payer: Self-pay

## 2021-09-07 DIAGNOSIS — I5041 Acute combined systolic (congestive) and diastolic (congestive) heart failure: Secondary | ICD-10-CM | POA: Diagnosis not present

## 2021-09-07 DIAGNOSIS — E7800 Pure hypercholesterolemia, unspecified: Secondary | ICD-10-CM | POA: Diagnosis not present

## 2021-09-07 DIAGNOSIS — J45909 Unspecified asthma, uncomplicated: Secondary | ICD-10-CM | POA: Diagnosis not present

## 2021-09-07 DIAGNOSIS — Z20822 Contact with and (suspected) exposure to covid-19: Secondary | ICD-10-CM | POA: Diagnosis not present

## 2021-09-07 DIAGNOSIS — M1990 Unspecified osteoarthritis, unspecified site: Secondary | ICD-10-CM | POA: Diagnosis not present

## 2021-09-07 DIAGNOSIS — G894 Chronic pain syndrome: Secondary | ICD-10-CM | POA: Diagnosis not present

## 2021-09-07 DIAGNOSIS — K219 Gastro-esophageal reflux disease without esophagitis: Secondary | ICD-10-CM | POA: Diagnosis not present

## 2021-09-07 NOTE — Telephone Encounter (Signed)
? ?  Pre-operative Risk Assessment  ?  ?Patient Name: Christy Stephenson  ?DOB: 27-Nov-1940 ?MRN: [id number]  ? ?  ? ?Request for Surgical Clearance   ? ?Procedure:   Lumbar Woound I & D ? ?Date of Surgery:  Clearance 09/10/21                              ?   ?Surgeon:  Dr. Kristeen Miss   ?Surgeon's Group or Practice Name:  [facility]  ?Phone number:  [phone] ext [phone] ?Fax number:  [phone] ?  ?Type of Clearance Requested:   ?- Medical  ?- Pharmacy:  Hold Apixaban (Eliquis) instructions on holding and restarting  ?  ?Type of Anesthesia:  General  ?  ?Additional requests/questions:  Please fax a copy of [phone] to the surgeon's office. ? ?Signed, ?Jacqulynn Cadet   ?09/07/2021, 4:15 PM  ? ?

## 2021-09-07 NOTE — Anesthesia Preprocedure Evaluation (Addendum)
Anesthesia Evaluation  ?Patient identified by MRN, date of birth, ID band ?Patient awake ? ? ? ?Reviewed: ?Allergy & Precautions, NPO status , Patient's Chart, lab work & pertinent test results ? ?History of Anesthesia Complications ?(+) PONV and history of anesthetic complications ? ?Airway ?Mallampati: II ? ?TM Distance: >3 FB ?Neck ROM: Full ? ? ? Dental ?no notable dental hx. ? ?  ?Pulmonary ?asthma , sleep apnea , COPD,  ?  ?Pulmonary exam normal ?breath sounds clear to auscultation ? ? ? ? ? ? Cardiovascular ?+CHF  ? ?Rhythm:Regular Rate:Normal ? ?Sinus rhythm ?Low voltage QRS ?Abnormal ECG ?When compared with ECG of 09-Jul-2012 10:58, ?Premature atrial complexes NO LONGER PRESENT ? ?Echo 08/21/21 (in setting of post-operative PE, septic shock): ?IMPRESSIONS  ??1. Left ventricular ejection fraction, by estimation, is 30 to 35%. Left  ?ventricular ejection fraction by 3D volume is 35 %. The left ventricle has  ?moderately decreased function. The left ventricle demonstrates global  ?hypokinesis. There is mild left  ?ventricular hypertrophy. Left ventricular diastolic parameters are  ?consistent with Grade I diastolic dysfunction (impaired relaxation).  ??2. Right ventricular systolic function is mildly reduced. The right  ?ventricular size is mildly enlarged. There is moderately elevated  ?pulmonary artery systolic pressure.  ??3. The mitral valve is normal in structure. Mild to moderate mitral valve  ?regurgitation. No evidence of mitral stenosis.  ??4. Tricuspid valve regurgitation is mild to moderate.  ??5. The aortic valve is normal in structure. Aortic valve regurgitation is  ?not visualized. No aortic stenosis is present.  ? ? ?  ?Neuro/Psych ?TIA Neuromuscular disease CVA negative psych ROS  ? GI/Hepatic ?Neg liver ROS, hiatal hernia, GERD  ,  ?Endo/Other  ?negative endocrine ROS ? Renal/GU ?Renal InsufficiencyRenal disease  ?negative genitourinary ?   ?Musculoskeletal ? ?(+) Arthritis , Osteoarthritis,   ? Abdominal ?Normal abdominal exam  (+)   ?Peds ?negative pediatric ROS ?(+)  Hematology ? ?(+) Blood dyscrasia, anemia ,   ?Anesthesia Other Findings ? ? Reproductive/Obstetrics ?negative OB ROS ? ?  ? ? ? ? ? ? ? ? ? ? ? ? ? ?  ?  ? ? ? ? ? ?Anesthesia Physical ?Anesthesia Plan ? ?ASA: 3 ? ?Anesthesia Plan: General  ? ?Post-op Pain Management: Tylenol PO (pre-op)*  ? ?Induction: Intravenous ? ?PONV Risk Score and Plan: 4 or greater and Ondansetron and Treatment may vary due to age or medical condition ? ?Airway Management Planned: Oral ETT ? ?Additional Equipment: None ? ?Intra-op Plan:  ? ?Post-operative Plan: Extubation in OR ? ?Informed Consent: I have reviewed the patients History and Physical, chart, labs and discussed the procedure including the risks, benefits and alternatives for the proposed anesthesia with the patient or authorized representative who has indicated his/her understanding and acceptance.  ? ? ? ?Dental advisory given ? ?Plan Discussed with: Anesthesiologist, CRNA and Surgeon ? ?Anesthesia Plan Comments: (Patient anemic on discharge from hospital. Type and screen ordered by me. Patient still on eliquis due to post-op PE. Surgeon aware. Norton Blizzard, MD  ? ?)  ? ? ? ? ?Anesthesia Quick Evaluation ? ?

## 2021-09-07 NOTE — Telephone Encounter (Signed)
Received message from Myra Gianotti, [place] with anesthesia: ? ?I am a PA with anesthesia. This was a late add on case for Monday and just now looking at her chart. I did just get off the phone with the surgeon's office. Dr. Ellene Route said he would have her continue the Eliquis given her recent PE. It sounds like her wound is infected so he feels case is urgent/emergent. I still have to look at notes more. Myra Gianotti, [place] [phone].  ? ? ?

## 2021-09-07 NOTE — Telephone Encounter (Signed)
? ?  ? ?  Primary Cardiologist: Quay Burow, MD ? ?Chart reviewed as part of pre-operative protocol coverage. Given past medical history and time since last visit, based on ACC/AHA guidelines, Daysia A Escalona would be at acceptable risk for the planned procedure without further cardiovascular testing.  ? ?Patient was advised that she develops new symptoms prior to surgery to contact our office to arrange a follow-up appointment.  He verbalized understanding. ? ?Her RCRI is a class II risk, 0.9% risk of major cardiac event. ? ?Patient with diagnosis of probable acute PE (08/21/2021) on Eliquis for anticoagulation. V/Q scan inconclusive, moderate reduced perfusion in LL lobe and mildly reduced RV function.  ?  ?Procedure: I&D Lumbar Wound ?Date of procedure: 09/10/2021  ?  ?CrCl 66 mL/min ?Platelet count 103K ?  ?With PE diagnosed two weeks ago, patient ideally should not be off therapeutic anticoagulation. Safest option would likely be to admit patient for heparin infusion prior to procedure.   ? ?I will route this recommendation to the requesting party via Epic fax function and remove from pre-op pool. ? ?Please call with questions. ? ?Jossie Ng. Jorgia Manthei NP-C ? ?  ?09/07/2021, 5:01 PM ?[place] ?[address] ?Office [phone] Fax [phone] ? ? ? ? ? ? ?

## 2021-09-07 NOTE — Telephone Encounter (Signed)
Patient with diagnosis of probable acute PE (08/21/2021) on Eliquis for anticoagulation. V/Q scan inconclusive, moderate reduced perfusion in LL lobe and mildly reduced RV function.  ? ?Procedure: I&D Lumbar Wound ?Date of procedure: 09/10/2021  ? ?CrCl 66 mL/min ?Platelet count 103K ? ?With PE diagnosed two weeks ago, patient ideally should not be off therapeutic anticoagulation. Safest option would likely be to admit patient for heparin infusion prior to procedure. Unsure if this can be coordinated as this request was received on Friday afternoon and the procedure is on Monday.  ?

## 2021-09-07 NOTE — Telephone Encounter (Signed)
? ?  Pre-operative Risk Assessment  ?  ?Patient Name: Christy Stephenson  ?DOB: Dec 04, 1940 ?MRN: [id number]  ? ?  ? ?Request for Surgical Clearance   ? ?Procedure:     I & D Lumbar Wound ? ?Date of Surgery:  Clearance 09/10/21                              ?   ?Surgeon:  Dr. Ellene Route ?Surgeon's Group or Practice Name:  [facility] ?Phone number:  [phone] ?Fax number:  [phone] ?  ?Type of Clearance Requested:   ?- Pharmacy:  Hold Apixaban (Eliquis)   ?  ?Type of Anesthesia:  General  ?  ?Additional requests/questions:   ? ?Signed, ?Belisicia T Harris   ?09/07/2021, 4:14 PM  ? ? ?

## 2021-09-07 NOTE — Progress Notes (Signed)
Anesthesia Chart Review: SAME DAY WORK-UP ? Case: [id number] Date/Time: 09/10/21 1520  ? Procedure: LUMBAR WOUND DEBRIDEMENT  ? Anesthesia type: General  ? Pre-op diagnosis: WOUND INFECTION, LUMBAR  ? Location: [facility] ROOM 20 / [facility] OR  ? Surgeons: Kristeen Miss, MD  ? ?  ? ? ?DISCUSSION: Patient is a 81 year old female scheduled for the above procedure.  ? ?She is s/p decompression via TLIF L1-2, revision posterior fixation L1-pelvis 08/07/21. She was discharged home on 08/10/21, but transferred to [facility] from [facility] ED after presenting with hypotension (SBP 70's), tachycardia, drainage from surgical wound, hypoxia requiring 3L O2. D-dimer > 25K, but unable to do CTA due to AKI with Creatinine 2.6 (up from 1.0). She was started on IVF, levophed to maintain MAP 65-75, IV antibiotics per to transfer with concern for septic shock. Once at [facility], VQ scan was inconclusive but suspicious for PE.  Lower extremity venous Dopplers were negative for DVT. Echo showed new LV dysfunction with EF 30 to 40%, grade 1 diastolic dysfunction and LV global hypokinesis, mildly reduced RV function, moderately elevated PASP, mild to moderate MR/TR.  Troponins peaked at 3397. She was evaluated by cardiology (consult by Quay Burow ,MD). Elevated enzymes felt likely related to demand ischemia, but would consider ischemic work-up as on out-patient and recheck echo in 3 months to assess for improvement in LVF.  GDMT limited in setting of hypotension, but with plan to optimize as an outpatient.  She was started on Heparin and then transitioned to Eliquis for likely acute PE. 08/21/21 blood cultures from [facility] were positive for E. Coli.  Felt more likely from urinary source.  Neurosurgery felt unlikely from surgical wound.  Off Levophed 08/23/21.  Creatinine and LFTs results improved. O2 weaned by discharge 08/27/21.  Outpatient cardiology follow-up planned. ? ?She had follow-up with Dr. Ellene Route on 09/07/21. The above procedure was  recommended. Ultimately, he decided to keep her on Eliquis given presumed positive PE on 08/21/21.  ?  ?Preoperative cardiology input outlined by Coletta Memos, NP on 09/07/21, "Given past medical history and time since last visit, based on ACC/AHA guidelines, Beauty A Fowle would be at acceptable risk for the planned procedure without further cardiovascular testing..." ?  ?  ?Other history includes never smoker, post-operative N/V, asthma, exertional dyspnea, COPD, iron deficiency anemia, hiatal hernia, palpitations, hypercholesterolemia, TIA (transient global anemia episode ~ 08/2012, no acute CVA 08/27/12 MRI and normal EEG 09/2012), peripheral neuropathy, OSA (uses CPAP), skin cancer (BCC), osteoarthritis (right TKA 12/06/19), cholecystectomy (02/18/17), spinal surgery (L2-S1 fusion; decompression via TLIF L1-2, revision posterior fixation L1-pelvis 08/07/21). Normal coronaries in 06/2012 with incidental finding of occluded innominate artery with no evidence of subclavian steal by 06/2012 carotid US. E.coli bacteremia (08/2021). PE (08/21/21 VQ scan suspicious for PE, unable to do CTA due to AKI). 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   ?   ?Reviewed with anesthesiologist Adele Barthel, MD.  Patient is a same-day work-up, so anesthesia team to evaluate on the day of surgery. ? ? ?VS:  ?BP Readings from Last 3 Encounters:  ?08/27/21 (!) 105/48  ?08/09/21 (!) 117/59  ?08/03/21 (!) 145/74  ? ?Pulse Readings from Last 3 Encounters:  ?08/27/21 73  ?08/09/21 76  ?08/03/21 68  ?  ? ?PROVIDERS: ?- Seward Carol, MD is PCP  ?- Baird Lyons, MD is pulmonologist. Last visit 05/07/21 for asthma, emphysema, bronchiectasis (on CT with evidence of prior granulomatous disease 08/04/12). She had COVID-19 02/2021 and was treated for  asthma exacerbation. CTA also ordered and ruled out PE then.  She is on Trelegy daily.   ?- Nehemiah Settle, MD is Sleep Medicine provider ?- She had not been routinely followed by  cardiology. She was evaluated by Daneen Schick, MD is 2014 for dyspnea and chest tightness.  She had had an abnormal nuclear perfusion study ("mixed distal anteroapical defect suggestive of LAD disease"), so coronary angiography was pursued then showing normal coronaries but incidental finding of occluded innominate artery. She has a hospital follow-up visit with Diona Browner, NP on 09/21/21 due to new finding of LV dysfunction during 08/21/21 admission for PE. ?  ? ?LABS: For day of surgery as indicated. Last labs from recent admission include: ?Lab Results  ?Component Value Date  ? WBC 12.7 (H) 08/27/2021  ? HGB 8.2 (L) 08/27/2021  ? HCT 24.9 (L) 08/27/2021  ? PLT 103 (L) 08/27/2021  ? GLUCOSE 128 (H) 08/27/2021  ? ALT 28 08/25/2021  ? AST 17 08/25/2021  ? NA 137 08/27/2021  ? K 4.0 08/27/2021  ? CL 105 08/27/2021  ? CREATININE 1.13 (H) 08/27/2021  ? BUN 21 08/27/2021  ? CO2 27 08/27/2021  ? TSH 0.298 (L) 08/21/2021  ? ? ? ?PFTs > 9 years ago. "PFT [facility] 07/23/12- mild obstructive airways disease with response to bronchodilator. Normal lung volumes, normal diffusion. FVC 2.36/79%, FEV1 1.89/83%, FEV1/FVC 0.80, FEF 25-75% 1.31/69%. TLC 96%, DLCO 95%." ?  ? ?IMAGES: ?MRI Abd 08/25/21: ?IMPRESSION: ?1. Status post cholecystectomy. Mild postoperative biliary ductal ?dilatation, common bile duct measuring up to 0.9 cm in caliber, ?tapering smoothly to the ampulla without obstructing calculus or ?other lesion identified. ?2. Small pericardial effusion. Trace bilateral pleural effusions. ?3. Anasarca. ?  ?1V PCXR 08/21/21: ?IMPRESSION: ?1. Concern for developing infiltrate or streaky atelectasis versus ?aspiration, left lower lobe infrahilar area. ?2. Stable left IJ central line placement. ?3. Cardiomegaly. ?4. Aortic atherosclerosis. ?  ?VQ Scan 08/21/21: ?IMPRESSION: ?Moderate sized areas of decreased perfusion are noted in the ?posterior left mid and left lower lung fields. The study is ?inconclusive to evaluate for pulmonary  embolism. If there is ?continued clinical suspicion for pulmonary embolism, please consider ?venous Doppler examination of both lower extremities and possibly CT ?angiogram. ?  ?  ?EKG: 08/21/21: ?Sinus rhythm ?Low voltage QRS ?Abnormal ECG ?When compared with ECG of 09-Jul-2012 10:58, ?Premature atrial complexes NO LONGER PRESENT ?Confirmed by Rudean Haskell [phone]) on 08/21/2021 4:27:41 PM ? ? ?CV: ?Echo 08/21/21 (in setting of post-operative PE, septic shock): ?IMPRESSIONS  ? 1. Left ventricular ejection fraction, by estimation, is 30 to 35%. Left  ?ventricular ejection fraction by 3D volume is 35 %. The left ventricle has  ?moderately decreased function. The left ventricle demonstrates global  ?hypokinesis. There is mild left  ?ventricular hypertrophy. Left ventricular diastolic parameters are  ?consistent with Grade I diastolic dysfunction (impaired relaxation).  ? 2. Right ventricular systolic function is mildly reduced. The right  ?ventricular size is mildly enlarged. There is moderately elevated  ?pulmonary artery systolic pressure.  ? 3. The mitral valve is normal in structure. Mild to moderate mitral valve  ?regurgitation. No evidence of mitral stenosis.  ? 4. Tricuspid valve regurgitation is mild to moderate.  ? 5. The aortic valve is normal in structure. Aortic valve regurgitation is  ?not visualized. No aortic stenosis is present.  ? ? ?US Venous LE 08/21/21: ?Summary:  ?RIGHT:  ?- There is no evidence of deep vein thrombosis in the lower extremity.  ?- No cystic structure  found in the popliteal fossa.   ?LEFT:  ?- There is no evidence of deep vein thrombosis in the lower extremity.  ?- No cystic structure found in the popliteal fossa.  ? ? ?US Carotid 07/17/12: ?Conclusions: ?No significant obstructive disease of the right carotid arterial system. ?No significant obstructive disease of the left carotid arterial system. ?Incidental finding of right thyroid nodule, 9 mm. ?Antegrade flow is demonstrated in  the vertebral arteries bilaterally, which would indicate no subclavian steal. ?  ?Cardiac cath 07/09/12: ?IMPRESSIONS:  ?1. 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

## 2021-09-07 NOTE — Progress Notes (Signed)
Spoke with pt for pre-op call. Pt had lumbar surgery here on 08/07/21. She was discharged home. On 08/24/21 pt began to have shortness of breath, feeling nauseated and had vomiting. Fell at home. She was taken to [facility] and then transferred back here. Pt found to have a PE and was started on Eliquis. Pt was also septic.  Pt states she is feeling better. When asked if she was instructed to hold her Eliquis she stated that Dr. Ellene Route told her to stop it as of today when he saw her at the office. Her last dose was this AM.  ?Pt states she is not diabetic.  ? ?Shower instructions given to pt.  ?

## 2021-09-10 ENCOUNTER — Inpatient Hospital Stay (HOSPITAL_COMMUNITY): Payer: Medicare Other | Admitting: Vascular Surgery

## 2021-09-10 ENCOUNTER — Inpatient Hospital Stay (HOSPITAL_COMMUNITY)
Admission: RE | Admit: 2021-09-10 | Discharge: 2021-09-14 | DRG: 858 | Disposition: A | Discharge status: Skilled Nursing Facility | Payer: Medicare Other | Attending: Neurological Surgery | Admitting: Neurological Surgery

## 2021-09-10 ENCOUNTER — Other Ambulatory Visit: Payer: Self-pay

## 2021-09-10 ENCOUNTER — Encounter (HOSPITAL_COMMUNITY): Payer: Self-pay | Admitting: Neurological Surgery

## 2021-09-10 ENCOUNTER — Encounter (HOSPITAL_COMMUNITY)
Admission: RE | Disposition: A | Discharge status: Skilled Nursing Facility | Payer: Self-pay | Source: Home / Self Care | Attending: Neurological Surgery

## 2021-09-10 DIAGNOSIS — J449 Chronic obstructive pulmonary disease, unspecified: Secondary | ICD-10-CM | POA: Diagnosis present

## 2021-09-10 DIAGNOSIS — J8410 Pulmonary fibrosis, unspecified: Secondary | ICD-10-CM | POA: Diagnosis not present

## 2021-09-10 DIAGNOSIS — G4730 Sleep apnea, unspecified: Secondary | ICD-10-CM | POA: Diagnosis not present

## 2021-09-10 DIAGNOSIS — Z823 Family history of stroke: Secondary | ICD-10-CM

## 2021-09-10 DIAGNOSIS — G629 Polyneuropathy, unspecified: Secondary | ICD-10-CM | POA: Diagnosis present

## 2021-09-10 DIAGNOSIS — K219 Gastro-esophageal reflux disease without esophagitis: Secondary | ICD-10-CM | POA: Diagnosis present

## 2021-09-10 DIAGNOSIS — Z801 Family history of malignant neoplasm of trachea, bronchus and lung: Secondary | ICD-10-CM

## 2021-09-10 DIAGNOSIS — Z8249 Family history of ischemic heart disease and other diseases of the circulatory system: Secondary | ICD-10-CM | POA: Diagnosis not present

## 2021-09-10 DIAGNOSIS — Z8616 Personal history of COVID-19: Secondary | ICD-10-CM | POA: Diagnosis not present

## 2021-09-10 DIAGNOSIS — K760 Fatty (change of) liver, not elsewhere classified: Secondary | ICD-10-CM | POA: Diagnosis present

## 2021-09-10 DIAGNOSIS — Z96651 Presence of right artificial knee joint: Secondary | ICD-10-CM | POA: Diagnosis present

## 2021-09-10 DIAGNOSIS — Z981 Arthrodesis status: Secondary | ICD-10-CM | POA: Diagnosis not present

## 2021-09-10 DIAGNOSIS — I509 Heart failure, unspecified: Secondary | ICD-10-CM | POA: Diagnosis not present

## 2021-09-10 DIAGNOSIS — Z7901 Long term (current) use of anticoagulants: Secondary | ICD-10-CM

## 2021-09-10 DIAGNOSIS — Z86711 Personal history of pulmonary embolism: Secondary | ICD-10-CM

## 2021-09-10 DIAGNOSIS — S31000A Unspecified open wound of lower back and pelvis without penetration into retroperitoneum, initial encounter: Secondary | ICD-10-CM

## 2021-09-10 DIAGNOSIS — Z831 Family history of other infectious and parasitic diseases: Secondary | ICD-10-CM | POA: Diagnosis not present

## 2021-09-10 DIAGNOSIS — Z882 Allergy status to sulfonamides status: Secondary | ICD-10-CM

## 2021-09-10 DIAGNOSIS — Z85828 Personal history of other malignant neoplasm of skin: Secondary | ICD-10-CM | POA: Diagnosis not present

## 2021-09-10 DIAGNOSIS — T8149XA Infection following a procedure, other surgical site, initial encounter: Principal | ICD-10-CM | POA: Diagnosis present

## 2021-09-10 DIAGNOSIS — N179 Acute kidney failure, unspecified: Secondary | ICD-10-CM | POA: Diagnosis present

## 2021-09-10 DIAGNOSIS — Z8673 Personal history of transient ischemic attack (TIA), and cerebral infarction without residual deficits: Secondary | ICD-10-CM | POA: Diagnosis not present

## 2021-09-10 DIAGNOSIS — I5042 Chronic combined systolic (congestive) and diastolic (congestive) heart failure: Secondary | ICD-10-CM | POA: Diagnosis present

## 2021-09-10 DIAGNOSIS — J45909 Unspecified asthma, uncomplicated: Secondary | ICD-10-CM | POA: Diagnosis not present

## 2021-09-10 DIAGNOSIS — Z9989 Dependence on other enabling machines and devices: Secondary | ICD-10-CM | POA: Diagnosis not present

## 2021-09-10 DIAGNOSIS — T8141XA Infection following a procedure, superficial incisional surgical site, initial encounter: Secondary | ICD-10-CM | POA: Diagnosis present

## 2021-09-10 DIAGNOSIS — G609 Hereditary and idiopathic neuropathy, unspecified: Secondary | ICD-10-CM | POA: Diagnosis not present

## 2021-09-10 DIAGNOSIS — I2699 Other pulmonary embolism without acute cor pulmonale: Secondary | ICD-10-CM | POA: Diagnosis present

## 2021-09-10 DIAGNOSIS — Z833 Family history of diabetes mellitus: Secondary | ICD-10-CM

## 2021-09-10 DIAGNOSIS — Z888 Allergy status to other drugs, medicaments and biological substances status: Secondary | ICD-10-CM | POA: Diagnosis not present

## 2021-09-10 DIAGNOSIS — T8140XA Infection following a procedure, unspecified, initial encounter: Secondary | ICD-10-CM | POA: Diagnosis not present

## 2021-09-10 DIAGNOSIS — Z79899 Other long term (current) drug therapy: Secondary | ICD-10-CM | POA: Diagnosis not present

## 2021-09-10 DIAGNOSIS — Y838 Other surgical procedures as the cause of abnormal reaction of the patient, or of later complication, without mention of misadventure at the time of the procedure: Secondary | ICD-10-CM | POA: Diagnosis present

## 2021-09-10 DIAGNOSIS — Z7951 Long term (current) use of inhaled steroids: Secondary | ICD-10-CM

## 2021-09-10 DIAGNOSIS — Z743 Need for continuous supervision: Secondary | ICD-10-CM | POA: Diagnosis not present

## 2021-09-10 DIAGNOSIS — M1711 Unilateral primary osteoarthritis, right knee: Secondary | ICD-10-CM | POA: Diagnosis present

## 2021-09-10 DIAGNOSIS — D71 Functional disorders of polymorphonuclear neutrophils: Secondary | ICD-10-CM | POA: Diagnosis present

## 2021-09-10 DIAGNOSIS — G4733 Obstructive sleep apnea (adult) (pediatric): Secondary | ICD-10-CM | POA: Diagnosis present

## 2021-09-10 DIAGNOSIS — D500 Iron deficiency anemia secondary to blood loss (chronic): Secondary | ICD-10-CM | POA: Diagnosis present

## 2021-09-10 DIAGNOSIS — G603 Idiopathic progressive neuropathy: Secondary | ICD-10-CM | POA: Diagnosis present

## 2021-09-10 DIAGNOSIS — Z9889 Other specified postprocedural states: Secondary | ICD-10-CM | POA: Diagnosis not present

## 2021-09-10 DIAGNOSIS — R531 Weakness: Secondary | ICD-10-CM | POA: Diagnosis not present

## 2021-09-10 HISTORY — DX: COVID-19: U07.1

## 2021-09-10 HISTORY — PX: LUMBAR WOUND DEBRIDEMENT: SHX1988

## 2021-09-10 HISTORY — DX: Other pulmonary embolism without acute cor pulmonale: I26.99

## 2021-09-10 LAB — TYPE AND SCREEN
ABO/RH(D): A POS
Antibody Screen: NEGATIVE

## 2021-09-10 SURGERY — LUMBAR WOUND DEBRIDEMENT
Anesthesia: General

## 2021-09-10 MED ORDER — SODIUM CHLORIDE 0.9 % IV SOLN: 250.0000 mL | INTRAVENOUS | Status: DC

## 2021-09-10 MED ORDER — FENTANYL CITRATE (PF) 100 MCG/2ML IJ SOLN
INTRAMUSCULAR | Status: AC
  Filled 2021-09-10: qty 2

## 2021-09-10 MED ORDER — THROMBIN 5000 UNITS EX SOLR
CUTANEOUS | Status: AC
  Filled 2021-09-10: qty 5000

## 2021-09-10 MED ORDER — OXYCODONE-ACETAMINOPHEN 5-325 MG PO TABS
1.0000 | ORAL_TABLET | ORAL | Status: DC | PRN
  Administered 2021-09-10: 1 via ORAL
  Administered 2021-09-11: 2 via ORAL
  Administered 2021-09-11: 1 via ORAL
  Administered 2021-09-12 (×2): 2 via ORAL
  Administered 2021-09-13: 1 via ORAL
  Administered 2021-09-13 – 2021-09-14 (×3): 2 via ORAL
  Filled 2021-09-10 (×3): qty 2
  Filled 2021-09-10 (×2): qty 1
  Filled 2021-09-10 (×4): qty 2
  Filled 2021-09-10: qty 1
  Filled 2021-09-10: qty 2

## 2021-09-10 MED ORDER — THROMBIN 5000 UNITS EX SOLR: OROMUCOSAL | Status: DC | PRN

## 2021-09-10 MED ORDER — PHENOL 1.4 % MT LIQD: 1.0000 | OROMUCOSAL | Status: DC | PRN

## 2021-09-10 MED ORDER — ALBUTEROL SULFATE (2.5 MG/3ML) 0.083% IN NEBU: 2.5000 mg | INHALATION_SOLUTION | Freq: Four times a day (QID) | RESPIRATORY_TRACT | Status: DC | PRN

## 2021-09-10 MED ORDER — METOPROLOL SUCCINATE ER 25 MG PO TB24
25.0000 mg | ORAL_TABLET | Freq: Every day | ORAL | Status: DC
  Administered 2021-09-13 – 2021-09-14 (×2): 25 mg via ORAL
  Filled 2021-09-10 (×4): qty 1

## 2021-09-10 MED ORDER — PREGABALIN 100 MG PO CAPS
200.0000 mg | ORAL_CAPSULE | Freq: Every day | ORAL | Status: DC
  Administered 2021-09-11 – 2021-09-13 (×4): 200 mg via ORAL
  Filled 2021-09-10 (×4): qty 2

## 2021-09-10 MED ORDER — ACETAMINOPHEN 650 MG RE SUPP: 650.0000 mg | RECTAL | Status: DC | PRN

## 2021-09-10 MED ORDER — SODIUM CHLORIDE 0.9% FLUSH: 3.0000 mL | INTRAVENOUS | Status: DC | PRN

## 2021-09-10 MED ORDER — FENTANYL CITRATE (PF) 250 MCG/5ML IJ SOLN
INTRAMUSCULAR | Status: DC | PRN
  Administered 2021-09-10 (×2): 50 ug via INTRAVENOUS

## 2021-09-10 MED ORDER — LORAZEPAM 0.5 MG PO TABS: 0.2500 mg | ORAL_TABLET | Freq: Every day | ORAL | Status: DC | PRN

## 2021-09-10 MED ORDER — METHOCARBAMOL 500 MG PO TABS: 500.0000 mg | ORAL_TABLET | Freq: Four times a day (QID) | ORAL | Status: DC | PRN

## 2021-09-10 MED ORDER — ACETAMINOPHEN 500 MG PO TABS
ORAL_TABLET | ORAL | Status: AC
  Filled 2021-09-10: qty 2

## 2021-09-10 MED ORDER — CEFAZOLIN SODIUM-DEXTROSE 2-4 GM/100ML-% IV SOLN: 2.0000 g | INTRAVENOUS | Status: DC

## 2021-09-10 MED ORDER — PROPOFOL 10 MG/ML IV BOLUS
INTRAVENOUS | Status: DC | PRN
  Administered 2021-09-10: 130 mg via INTRAVENOUS

## 2021-09-10 MED ORDER — ALBUTEROL SULFATE HFA 108 (90 BASE) MCG/ACT IN AERS: 2.0000 | INHALATION_SPRAY | Freq: Four times a day (QID) | RESPIRATORY_TRACT | Status: DC | PRN

## 2021-09-10 MED ORDER — FLEET ENEMA 7-19 GM/118ML RE ENEM: 1.0000 | ENEMA | Freq: Once | RECTAL | Status: DC | PRN

## 2021-09-10 MED ORDER — ROCURONIUM BROMIDE 10 MG/ML (PF) SYRINGE
PREFILLED_SYRINGE | INTRAVENOUS | Status: AC
  Filled 2021-09-10: qty 20

## 2021-09-10 MED ORDER — PHENYLEPHRINE 80 MCG/ML (10ML) SYRINGE FOR IV PUSH (FOR BLOOD PRESSURE SUPPORT)
PREFILLED_SYRINGE | INTRAVENOUS | Status: DC | PRN
  Administered 2021-09-10: 80 ug via INTRAVENOUS
  Administered 2021-09-10: 160 ug via INTRAVENOUS

## 2021-09-10 MED ORDER — ACETAMINOPHEN 325 MG PO TABS
650.0000 mg | ORAL_TABLET | ORAL | Status: DC | PRN
  Filled 2021-09-10: qty 2

## 2021-09-10 MED ORDER — ONDANSETRON HCL 4 MG PO TABS: 4.0000 mg | ORAL_TABLET | Freq: Four times a day (QID) | ORAL | Status: DC | PRN

## 2021-09-10 MED ORDER — FENTANYL CITRATE (PF) 100 MCG/2ML IJ SOLN
25.0000 ug | INTRAMUSCULAR | Status: DC | PRN
  Administered 2021-09-10: 25 ug via INTRAVENOUS

## 2021-09-10 MED ORDER — MORPHINE SULFATE (PF) 2 MG/ML IV SOLN
2.0000 mg | INTRAVENOUS | Status: DC | PRN
  Administered 2021-09-13: 2 mg via INTRAVENOUS
  Filled 2021-09-10: qty 1

## 2021-09-10 MED ORDER — FLUTICASONE FUROATE-VILANTEROL 100-25 MCG/ACT IN AEPB
1.0000 | INHALATION_SPRAY | Freq: Every day | RESPIRATORY_TRACT | Status: DC
Start: 2021-09-11 — End: 2021-09-14
  Administered 2021-09-11 – 2021-09-14 (×3): 1 via RESPIRATORY_TRACT
  Filled 2021-09-10 (×2): qty 28

## 2021-09-10 MED ORDER — CHOLESTYRAMINE 4 G PO PACK
4.0000 g | PACK | Freq: Every day | ORAL | Status: DC
  Administered 2021-09-11 – 2021-09-14 (×3): 4 g via ORAL
  Filled 2021-09-10 (×4): qty 1

## 2021-09-10 MED ORDER — PREGABALIN 100 MG PO CAPS: 100.0000 mg | ORAL_CAPSULE | ORAL | Status: DC

## 2021-09-10 MED ORDER — ONDANSETRON HCL 4 MG/2ML IJ SOLN
INTRAMUSCULAR | Status: DC | PRN
  Administered 2021-09-10: 4 mg via INTRAVENOUS

## 2021-09-10 MED ORDER — CEFAZOLIN SODIUM-DEXTROSE 2-4 GM/100ML-% IV SOLN
2.0000 g | Freq: Three times a day (TID) | INTRAVENOUS | Status: DC
  Administered 2021-09-10 – 2021-09-11 (×2): 2 g via INTRAVENOUS
  Filled 2021-09-10 (×2): qty 100

## 2021-09-10 MED ORDER — APIXABAN 5 MG PO TABS
5.0000 mg | ORAL_TABLET | Freq: Two times a day (BID) | ORAL | Status: DC
  Administered 2021-09-11 – 2021-09-14 (×7): 5 mg via ORAL
  Filled 2021-09-10 (×7): qty 1

## 2021-09-10 MED ORDER — CHLORHEXIDINE GLUCONATE CLOTH 2 % EX PADS
6.0000 | MEDICATED_PAD | Freq: Once | CUTANEOUS | Status: DC
Start: 2021-09-10 — End: 2021-09-10

## 2021-09-10 MED ORDER — OXYCODONE HCL 5 MG/5ML PO SOLN: 5.0000 mg | Freq: Once | ORAL | Status: DC | PRN

## 2021-09-10 MED ORDER — LACTATED RINGERS IV SOLN: INTRAVENOUS | Status: DC

## 2021-09-10 MED ORDER — SUGAMMADEX SODIUM 200 MG/2ML IV SOLN
INTRAVENOUS | Status: DC | PRN
  Administered 2021-09-10: 400 mg via INTRAVENOUS

## 2021-09-10 MED ORDER — POLYETHYLENE GLYCOL 3350 17 G PO PACK: 17.0000 g | PACK | Freq: Every day | ORAL | Status: DC | PRN

## 2021-09-10 MED ORDER — APIXABAN 5 MG PO TABS: 10.0000 mg | ORAL_TABLET | Freq: Two times a day (BID) | ORAL | Status: DC

## 2021-09-10 MED ORDER — EPHEDRINE 5 MG/ML INJ
INTRAVENOUS | Status: AC
  Filled 2021-09-10: qty 5

## 2021-09-10 MED ORDER — ONDANSETRON HCL 4 MG/2ML IJ SOLN
INTRAMUSCULAR | Status: AC
  Filled 2021-09-10: qty 4

## 2021-09-10 MED ORDER — DOCUSATE SODIUM 100 MG PO CAPS
100.0000 mg | ORAL_CAPSULE | Freq: Two times a day (BID) | ORAL | Status: DC
  Administered 2021-09-12 – 2021-09-14 (×2): 100 mg via ORAL
  Filled 2021-09-10 (×5): qty 1

## 2021-09-10 MED ORDER — ONDANSETRON HCL 4 MG/2ML IJ SOLN: 4.0000 mg | Freq: Once | INTRAMUSCULAR | Status: DC | PRN

## 2021-09-10 MED ORDER — ONDANSETRON HCL 4 MG PO TABS
4.0000 mg | ORAL_TABLET | Freq: Four times a day (QID) | ORAL | Status: DC | PRN
  Administered 2021-09-13: 4 mg via ORAL
  Filled 2021-09-10: qty 1

## 2021-09-10 MED ORDER — ONDANSETRON HCL 4 MG/2ML IJ SOLN: 4.0000 mg | Freq: Four times a day (QID) | INTRAMUSCULAR | Status: DC | PRN

## 2021-09-10 MED ORDER — CHLORHEXIDINE GLUCONATE 0.12 % MT SOLN
15.0000 mL | Freq: Once | OROMUCOSAL | Status: AC
  Administered 2021-09-10: 15 mL via OROMUCOSAL
  Filled 2021-09-10: qty 15

## 2021-09-10 MED ORDER — SENNA 8.6 MG PO TABS
1.0000 | ORAL_TABLET | Freq: Two times a day (BID) | ORAL | Status: DC
  Administered 2021-09-12 – 2021-09-14 (×3): 8.6 mg via ORAL
  Filled 2021-09-10 (×5): qty 1

## 2021-09-10 MED ORDER — CEFAZOLIN SODIUM 1 G IJ SOLR
INTRAMUSCULAR | Status: AC
  Filled 2021-09-10: qty 40

## 2021-09-10 MED ORDER — PHENYLEPHRINE HCL-NACL 20-0.9 MG/250ML-% IV SOLN
INTRAVENOUS | Status: DC | PRN
  Administered 2021-09-10: 20 ug/min via INTRAVENOUS

## 2021-09-10 MED ORDER — OXYCODONE HCL 5 MG PO TABS: 5.0000 mg | ORAL_TABLET | Freq: Once | ORAL | Status: DC | PRN

## 2021-09-10 MED ORDER — MENTHOL 3 MG MT LOZG: 1.0000 | LOZENGE | OROMUCOSAL | Status: DC | PRN

## 2021-09-10 MED ORDER — ORAL CARE MOUTH RINSE: 15.0000 mL | Freq: Once | OROMUCOSAL | Status: AC

## 2021-09-10 MED ORDER — METHOCARBAMOL 1000 MG/10ML IJ SOLN
500.0000 mg | Freq: Four times a day (QID) | INTRAVENOUS | Status: DC | PRN
  Filled 2021-09-10: qty 5

## 2021-09-10 MED ORDER — ASCORBIC ACID 500 MG PO TABS
1000.0000 mg | ORAL_TABLET | Freq: Every day | ORAL | Status: DC
  Administered 2021-09-11 – 2021-09-14 (×4): 1000 mg via ORAL
  Filled 2021-09-10 (×4): qty 2

## 2021-09-10 MED ORDER — PHENYLEPHRINE 80 MCG/ML (10ML) SYRINGE FOR IV PUSH (FOR BLOOD PRESSURE SUPPORT)
PREFILLED_SYRINGE | INTRAVENOUS | Status: AC
  Filled 2021-09-10: qty 10

## 2021-09-10 MED ORDER — ROCURONIUM BROMIDE 10 MG/ML (PF) SYRINGE
PREFILLED_SYRINGE | INTRAVENOUS | Status: DC | PRN
  Administered 2021-09-10: 60 mg via INTRAVENOUS

## 2021-09-10 MED ORDER — DEXAMETHASONE SODIUM PHOSPHATE 10 MG/ML IJ SOLN
INTRAMUSCULAR | Status: AC
  Filled 2021-09-10: qty 1

## 2021-09-10 MED ORDER — SODIUM CHLORIDE 0.9% FLUSH
3.0000 mL | Freq: Two times a day (BID) | INTRAVENOUS | Status: DC
  Administered 2021-09-11 – 2021-09-14 (×5): 3 mL via INTRAVENOUS

## 2021-09-10 MED ORDER — METHOCARBAMOL 500 MG PO TABS
500.0000 mg | ORAL_TABLET | Freq: Four times a day (QID) | ORAL | Status: DC | PRN
  Administered 2021-09-10 – 2021-09-14 (×8): 500 mg via ORAL
  Filled 2021-09-10 (×8): qty 1

## 2021-09-10 MED ORDER — CEFAZOLIN SODIUM-DEXTROSE 2-3 GM-%(50ML) IV SOLR
INTRAVENOUS | Status: DC | PRN
  Administered 2021-09-10: 2 g via INTRAVENOUS

## 2021-09-10 MED ORDER — FENTANYL CITRATE (PF) 250 MCG/5ML IJ SOLN
INTRAMUSCULAR | Status: AC
  Filled 2021-09-10: qty 5

## 2021-09-10 MED ORDER — PROPOFOL 10 MG/ML IV BOLUS
INTRAVENOUS | Status: AC
  Filled 2021-09-10: qty 20

## 2021-09-10 MED ORDER — LIDOCAINE 2% (20 MG/ML) 5 ML SYRINGE
INTRAMUSCULAR | Status: AC
  Filled 2021-09-10: qty 10

## 2021-09-10 MED ORDER — ESOMEPRAZOLE MAGNESIUM 20 MG PO CPDR: 22.3000 mg | DELAYED_RELEASE_CAPSULE | Freq: Every day | ORAL | Status: DC

## 2021-09-10 MED ORDER — 0.9 % SODIUM CHLORIDE (POUR BTL) OPTIME
TOPICAL | Status: DC | PRN
  Administered 2021-09-10: 1000 mL

## 2021-09-10 MED ORDER — UMECLIDINIUM BROMIDE 62.5 MCG/ACT IN AEPB
1.0000 | INHALATION_SPRAY | Freq: Every day | RESPIRATORY_TRACT | Status: DC
  Administered 2021-09-11 – 2021-09-14 (×3): 1 via RESPIRATORY_TRACT
  Filled 2021-09-10: qty 7

## 2021-09-10 MED ORDER — PANTOPRAZOLE SODIUM 40 MG PO TBEC
40.0000 mg | DELAYED_RELEASE_TABLET | Freq: Every day | ORAL | Status: DC
Start: 2021-09-11 — End: 2021-09-14
  Administered 2021-09-11 – 2021-09-14 (×4): 40 mg via ORAL
  Filled 2021-09-10 (×4): qty 1

## 2021-09-10 MED ORDER — ACETAMINOPHEN 500 MG PO TABS: 1000.0000 mg | ORAL_TABLET | Freq: Once | ORAL | Status: DC

## 2021-09-10 MED ORDER — BISACODYL 10 MG RE SUPP: 10.0000 mg | Freq: Every day | RECTAL | Status: DC | PRN

## 2021-09-10 MED ORDER — AMISULPRIDE (ANTIEMETIC) 5 MG/2ML IV SOLN: 10.0000 mg | Freq: Once | INTRAVENOUS | Status: DC | PRN

## 2021-09-10 MED ORDER — LIDOCAINE 2% (20 MG/ML) 5 ML SYRINGE
INTRAMUSCULAR | Status: DC | PRN
  Administered 2021-09-10: 80 mg via INTRAVENOUS

## 2021-09-10 MED ORDER — PREGABALIN 100 MG PO CAPS
100.0000 mg | ORAL_CAPSULE | Freq: Every day | ORAL | Status: DC
  Administered 2021-09-11 – 2021-09-14 (×4): 100 mg via ORAL
  Filled 2021-09-10 (×5): qty 1

## 2021-09-10 MED ORDER — POLYVINYL ALCOHOL 1.4 % OP SOLN
Freq: Two times a day (BID) | OPHTHALMIC | Status: DC
  Filled 2021-09-10: qty 15

## 2021-09-10 SURGICAL SUPPLY — 51 items
APL SKNCLS STERI-STRIP NONHPOA (GAUZE/BANDAGES/DRESSINGS)
BAG COUNTER SPONGE SURGICOUNT (BAG) ×2 IMPLANT
BAG SPNG CNTER NS LX DISP (BAG) ×1
BENZOIN TINCTURE PRP APPL 2/3 (GAUZE/BANDAGES/DRESSINGS) IMPLANT
BLADE CLIPPER SURG (BLADE) IMPLANT
CABLE BIPOLOR RESECTION CORD (MISCELLANEOUS) ×2 IMPLANT
CANISTER SUCT 3000ML PPV (MISCELLANEOUS) ×2 IMPLANT
CANISTER WOUNDNEG PRESSURE 500 (CANNISTER) ×1 IMPLANT
CARTRIDGE OIL MAESTRO DRILL (MISCELLANEOUS) ×1 IMPLANT
DECANTER SPIKE VIAL GLASS SM (MISCELLANEOUS) ×2 IMPLANT
DIFFUSER DRILL AIR PNEUMATIC (MISCELLANEOUS) ×2 IMPLANT
DRAPE LAPAROTOMY 100X72X124 (DRAPES) ×2 IMPLANT
DRSG VAC ATS MED SENSATRAC (GAUZE/BANDAGES/DRESSINGS) ×1 IMPLANT
DURAPREP 26ML APPLICATOR (WOUND CARE) ×2 IMPLANT
ELECT REM PT RETURN 9FT ADLT (ELECTROSURGICAL) ×2
ELECTRODE REM PT RTRN 9FT ADLT (ELECTROSURGICAL) ×1 IMPLANT
GAUZE 4X4 16PLY ~~LOC~~+RFID DBL (SPONGE) IMPLANT
GAUZE SPONGE 4X4 12PLY STRL (GAUZE/BANDAGES/DRESSINGS) ×2 IMPLANT
GLOVE BIOGEL PI IND STRL 8.5 (GLOVE) ×1 IMPLANT
GLOVE BIOGEL PI INDICATOR 8.5 (GLOVE) ×1
GLOVE ECLIPSE 8.5 STRL (GLOVE) ×2 IMPLANT
GLOVE EXAM NITRILE XL STR (GLOVE) IMPLANT
GOWN STRL REUS W/ TWL LRG LVL3 (GOWN DISPOSABLE) ×1 IMPLANT
GOWN STRL REUS W/ TWL XL LVL3 (GOWN DISPOSABLE) ×1 IMPLANT
GOWN STRL REUS W/TWL 2XL LVL3 (GOWN DISPOSABLE) ×2 IMPLANT
GOWN STRL REUS W/TWL LRG LVL3 (GOWN DISPOSABLE) ×2
GOWN STRL REUS W/TWL XL LVL3 (GOWN DISPOSABLE) ×2
HEMOSTAT POWDER KIT SURGIFOAM (HEMOSTASIS) ×1 IMPLANT
KIT BASIN OR (CUSTOM PROCEDURE TRAY) ×2 IMPLANT
KIT TURNOVER KIT B (KITS) ×2 IMPLANT
MARKER SKIN DUAL TIP RULER LAB (MISCELLANEOUS) ×2 IMPLANT
NEEDLE HYPO 22GX1.5 SAFETY (NEEDLE) ×2 IMPLANT
NS IRRIG 1000ML POUR BTL (IV SOLUTION) ×2 IMPLANT
OIL CARTRIDGE MAESTRO DRILL (MISCELLANEOUS) ×2
PACK LAMINECTOMY NEURO (CUSTOM PROCEDURE TRAY) ×2 IMPLANT
PAD ARMBOARD 7.5X6 YLW CONV (MISCELLANEOUS) ×2 IMPLANT
SPONGE SURGIFOAM ABS GEL SZ50 (HEMOSTASIS) ×2 IMPLANT
SPONGE T-LAP 4X18 ~~LOC~~+RFID (SPONGE) IMPLANT
STAPLER SKIN PROX WIDE 3.9 (STAPLE) IMPLANT
STRIP CLOSURE SKIN 1/2X4 (GAUZE/BANDAGES/DRESSINGS) IMPLANT
SUT VIC AB 0 CT1 18XCR BRD8 (SUTURE) ×1 IMPLANT
SUT VIC AB 0 CT1 8-18 (SUTURE) ×2
SUT VIC AB 2-0 CP2 18 (SUTURE) ×2 IMPLANT
SUT VIC AB 3-0 SH 8-18 (SUTURE) ×2 IMPLANT
SUT VIC AB 4-0 RB1 18 (SUTURE) ×2 IMPLANT
SWAB COLLECTION DEVICE MRSA (MISCELLANEOUS) IMPLANT
SWAB CULTURE ESWAB REG 1ML (MISCELLANEOUS) IMPLANT
SYR CONTROL 10ML LL (SYRINGE) ×2 IMPLANT
TOWEL GREEN STERILE (TOWEL DISPOSABLE) ×2 IMPLANT
TOWEL GREEN STERILE FF (TOWEL DISPOSABLE) ×2 IMPLANT
WATER STERILE IRR 1000ML POUR (IV SOLUTION) ×2 IMPLANT

## 2021-09-10 NOTE — Anesthesia Procedure Notes (Signed)
Procedure Name: Intubation ?Date/Time: 09/10/2021 4:43 PM ?Performed by: Griffin Dakin, CRNA ?Pre-anesthesia Checklist: Patient identified, Emergency Drugs available, Suction available and Patient being monitored ?Patient Re-evaluated:Patient Re-evaluated prior to induction ?Oxygen Delivery Method: Circle system utilized ?Preoxygenation: Pre-oxygenation with 100% oxygen ?Induction Type: IV induction ?Ventilation: Mask ventilation without difficulty ?Laryngoscope Size: Mac and 4 ?Grade View: Grade I ?Tube type: Oral ?Tube size: 7.0 mm ?Number of attempts: 1 ?Airway Equipment and Method: Stylet and Oral airway ?Placement Confirmation: ETT inserted through vocal cords under direct vision, positive ETCO2 and breath sounds checked- equal and bilateral ?Secured at: 24 cm ?Tube secured with: Tape ?Dental Injury: Teeth and Oropharynx as per pre-operative assessment  ? ? ? ? ?

## 2021-09-10 NOTE — Transfer of Care (Signed)
Immediate Anesthesia Transfer of Care Note ? ?Patient: Christy Stephenson ? ?Procedure(s) Performed: LUMBAR WOUND DEBRIDEMENT ? ?Patient Location: PACU ? ?Anesthesia Type:General ? ?Level of Consciousness: awake, alert  and oriented ? ?Airway & Oxygen Therapy: Patient Spontanous Breathing ? ?Post-op Assessment: Report given to RN and Post -op Vital signs reviewed and stable ? ?Post vital signs: Reviewed and stable ? ?Last Vitals:  ?Vitals Value Taken Time  ?BP 132/69 09/10/21 1736  ?Temp    ?Pulse 76 09/10/21 1739  ?Resp 19 09/10/21 1739  ?SpO2 94 % 09/10/21 1739  ?Vitals shown include unvalidated device data. ? ?Last Pain:  ?Vitals:  ? 09/10/21 1332  ?PainSc: 0-No pain  ?   ? ?  ? ?Complications: No notable events documented. ?

## 2021-09-10 NOTE — Op Note (Signed)
Date of surgery: 09/10/2021 ?Preoperative diagnosis: Lumbar wound infection ?Postoperative diagnosis: Same ?Procedure: Debridement of lumbar wound infection ?Surgeon: Kristeen Miss ?Anesthesia: General endotracheal ?Indications: Christy Stephenson is a 81 year old individual who on 21 March underwent surgical decompression and stabilization from L2 to the sacrum.  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  On 4 April she was admitted with short of breath and found to have pulmonary embolus.  At that time is noted that she had some minor drainage from the wound.  She was discharged home.  I saw her on the 14th and noted that she had a an area of dehiscence in the midportion of the incision.  There is some mild serosanguineous drainage and I advised that we observe this and start her on some oral antibiotics.  I did a wound check this past Friday and noted that it was no better and perhaps seemed a bit bigger I advised that she would require surgical exploration and debridement of this lumbar wound.  She is now admitted for that procedure. ? ?Procedure: The patient was brought to the operating room supine on the stretcher.  After the smooth induction of general tracheal anesthesia she was carefully turned prone.  The back was prepped with alcohol and DuraPrep.  The incision itself was probed and was noted to be full of purulence and after draping it all out I opened the midportion of the incision and evacuated a considerable amount of purulent material that was in the subcutaneous space overlying the fascia I opened the incision inferiorly and then superiorly for approximately an inch on either side and noted that there was a significant devitalized tissue in the subcutaneous space we then debrided devitalized tissue and noted that the subcutaneous space was involved all the way down to the inferior most aspect of the incision and also the superiormost aspect of the incision  the fascia initially noted to be closed was noted to be open only on the right side exposing some of the hardware from L for L5 and the sacrum.  Bone graft in this area was also involved and this was removed.  The area was debrided copiously with lavage and blunt and sharp debridement.  Some skin edges which were devitalized were also debrided.  In the end it was felt that this would require use of a wound VAC in order to allow further debridement and time as this wound closed from the bottom up.  A medium size sponge was then inserted into the wound and the wound VAC system was applied to the skin.  No closure of the wound was attempted.  Cultures were obtained from deep within the wound and sent for both aerobic and anaerobic cultures.  Patient will be maintained on Ancef.  Infectious disease consultation will also be obtained postoperatively. ?

## 2021-09-10 NOTE — H&P (Addendum)
Christy Stephenson is an 81 y.o. female.   ?Chief Complaint: Lumbar surgical wound infection ?HPI: Christy Stephenson is a 29 year old individual who had surgery on August 07, 2021 to decompress and stabilize L2-3 above a previous fusion at L5-S1 from a pseudoarthrosis.  She underwent fixation to the pelvis and pedicle screw fixation at L2.  She tolerated surgery well and was discharged the following day however on April 4 she was admitted with significant weakness shortness of breath and was found to have pulmonary emboli.  She also had an acute kidney injury.  There was some note made of drainage and from the wound but she was treated with local wound care.  I saw her on 14 April and noted that there was some drainage from the midportion of the incision.  I started her on oral antibiotics and advised some local wound care.  Over the ensuing weeks this did not seem to improve or resolve the situation.  I have advised the patient be admitted to undergo surgical debridement of the wound and exploration for potential deep space infection.  She has had a moderate amount of back pain.  But she has not been febrile ? ?Past Medical History:  ?Diagnosis Date  ? Arthritis   ? osteo  ? Asthma   ? Asthma   ? Basal cell carcinoma   ? face  ? COPD (chronic obstructive pulmonary disease) ([place])   ? COVID   ? 2022  ? GERD (gastroesophageal reflux disease)   ? Heart palpitations   ? History of hiatal hernia 1991  ? History of iron deficiency anemia   ? History of migraine   ? while taking birth control pills  ? Hypercholesteremia   ? Neuropathy   ? left greater than right feet  ? Obesity   ? Obstructive sleep apnea 07/09/2012  ? CPAP   ? Peripheral neuropathy   ? Pneumonia   ? as a child  ? PONV (postoperative nausea and vomiting)   ? not since 1967  ? Pulmonary embolism ([place])   ? Shortness of breath   ? with exertion  ? Stroke [facility])   ? TIA (transient ischemic attack) 2014  ? TIA (transient ischemic attack) 2014  ? no residual - no follow up  needed  ? 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 ? ?Past Surgical History:  ?Procedure Laterality Date  ? APPLICATION OF ROBOTIC ASSISTANCE FOR SPINAL PROCEDURE N/A 08/07/2021  ? Procedure: APPLICATION OF ROBOTIC ASSISTANCE FOR SPINAL PROCEDURE;  Surgeon: Kristeen Miss, MD;  Location: [facility];  Service: Neurosurgery;  Laterality: N/A;  ? BREAST BIOPSY Right   ? BREAST CYST EXCISION Left   ? CARDIAC CATHETERIZATION  07/09/2012  ? cataracts Bilateral 2013  ? this was second one  ? CHOLECYSTECTOMY N/A 02/18/2017  ? Procedure: LAPAROSCOPIC CHOLECYSTECTOMY WITH INTRAOPERATIVE CHOLANGIOGRAM;  Surgeon: Erroll Luna, MD;  Location: [facility];  Service: General;  Laterality: N/A;  ? COLONOSCOPY    ? DILATION AND CURETTAGE OF UTERUS    ? EYE SURGERY Bilateral 2014  ? cataract removal 2013 and 2014  ? FINGER SURGERY Right 1971  ? ring finger repair  ? KNEE SURGERY Right   ? arthroscopy  ? LEFT HEART CATHETERIZATION WITH CORONARY ANGIOGRAM N/A 07/09/2012  ? Procedure: LEFT HEART CATHETERIZATION WITH CORONARY ANGIOGRAM;  Surgeon: Sinclair Grooms, MD;  Location: [facility] CATH LAB;  Service: Cardiovascular;  Laterality: N/A;  ? LUMBAR FUSION    ? L3-S1  ? TONSILLECTOMY    ?  TOTAL KNEE ARTHROPLASTY Right 12/06/2019  ? Procedure: TOTAL KNEE ARTHROPLASTY;  Surgeon: Gaynelle Arabian, MD;  Location: [facility];  Service: Orthopedics;  Laterality: Right;  64mn  ? TUBAL LIGATION    ? ? ?Family History  ?Problem Relation Age of Onset  ? Diabetes Mellitus II Mother   ? CVA Father   ? Heart attack Father   ? Lung cancer Father   ? Tuberculosis Sister   ?     bovine  ? ?Social History:  reports that she has never smoked. She has never used smokeless tobacco. She reports that she does not drink alcohol and does not use drugs. ? ?Allergies:  ?Allergies  ?Allergen Reactions  ? Sulfa Antibiotics Other (See Comments) and Palpitations  ? Actifed [Triprolidine-Pse] Palpitations  ? Lipitor [Atorvastatin] Other (See Comments)  ?  Aches and pains  ? Bupropion Hcl  Itching and Rash  ? ? ?Medications Prior to Admission  ?Medication Sig Dispense Refill  ? apixaban (ELIQUIS) 5 MG TABS tablet Take 2 tablets ('10mg'$  total) twice daily through 08/29/21.  Then on 08/30/21, take 1 tablet ('5mg'$ ) by mouth twice daily. 66 tablet 0  ? Ascorbic Acid (VITAMIN C) 1000 MG tablet Take 1,000 mg by mouth daily.    ? CALCIUM-VITAMIN D PO Take 1 tablet by mouth in the morning and at bedtime.    ? cefdinir (OMNICEF) 300 MG capsule Take 1 capsule by mouth every 12 hours. 30 capsule 3  ? Cholecalciferol (VITAMIN D) 125 MCG (5000 UT) CAPS Take 5,000 Units by mouth daily.    ? cholestyramine (QUESTRAN) 4 g packet Take 4 g by mouth daily.    ? diclofenac Sodium (VOLTAREN) 1 % GEL Apply 2 g topically daily as needed (pain).    ? Esomeprazole Magnesium (NEXIUM 24HR PO) Take 22.3 mg by mouth daily.    ? Fluticasone-Umeclidin-Vilant (TRELEGY ELLIPTA) 100-62.5-25 MCG/ACT AEPB INHALE 1 PUFF ONCE DAILY 60 each 11  ? LORazepam (ATIVAN) 0.5 MG tablet Take 0.25 mg by mouth daily as needed for anxiety.    ? methocarbamol (ROBAXIN) 500 MG tablet Take 1 tablet (500 mg total) by mouth every 6 (six) hours as needed for muscle spasms. 30 tablet 2  ? metoprolol succinate (TOPROL-XL) 25 MG 24 hr tablet Take 1 tablet (25 mg total) by mouth daily. 30 tablet 0  ? Multiple Vitamin (MULTIVITAMIN WITH MINERALS) TABS tablet Take 1 tablet by mouth daily.    ? ondansetron (ZOFRAN) 4 MG tablet Take 1 tablet (4 mg total) by mouth every 6 (six) hours as needed for nausea or vomiting. 20 tablet 2  ? OVER THE COUNTER MEDICATION Take 1 capsule by mouth daily. Leg Veins    ? oxyCODONE-acetaminophen (PERCOCET/ROXICET) 5-325 MG tablet Take 1-2 tablets by mouth every 4 (four) hours as needed for moderate pain or severe pain. 30 tablet 0  ? pregabalin (LYRICA) 100 MG capsule Take 100-200 mg by mouth See admin instructions. Take 100 mg by mouth in the morning and 200 mg in the evening    ? Propylene Glycol (SYSTANE BALANCE OP) Place 1 drop into  both eyes in the morning and at bedtime.    ? zinc gluconate 50 MG tablet Take 50 mg by mouth daily.    ? albuterol (VENTOLIN HFA) 108 (90 Base) MCG/ACT inhaler Inhale 2 puffs into the lungs every 6 (six) hours as needed for wheezing or shortness of breath. 18 g 12  ? ? ?Results for orders placed or performed during the hospital encounter  of 09/10/21 (from the past 48 hour(s))  ?Type and screen [place]     Status: None  ? Collection Time: 09/10/21  1:35 PM  ?Result Value Ref Range  ? ABO/RH(D) A POS   ? Antibody Screen NEG   ? Sample Expiration    ?  09/13/2021,2359 ?Performed at [facility], [place] [address] [place], [place] [address] ?  ? ?No results found. ? ?Review of Systems  ?Constitutional:  Positive for activity change.  ?HENT: Negative.    ?Eyes: Negative.   ?Respiratory: Negative.    ?Cardiovascular: Negative.   ?Gastrointestinal: Negative.   ?Endocrine: Negative.   ?Genitourinary: Negative.   ?Musculoskeletal:  Positive for back pain and gait problem.  ?Skin: Negative.   ?Allergic/Immunologic: Negative.   ?Hematological: Negative.   ?Psychiatric/Behavioral: Negative.    ? ?Blood pressure (!) 100/54, pulse 71, temperature 98.1 ?F (36.7 ?C), resp. rate 16, height 5' 4.5" (1.638 m), weight 93 kg, SpO2 95 %. ?Physical Exam ?Constitutional:   ?   Appearance: Normal appearance.  ?HENT:  ?   Head: Normocephalic and atraumatic.  ?   Right Ear: Tympanic membrane normal.  ?   Left Ear: Tympanic membrane normal.  ?   Nose: Nose normal.  ?   Mouth/Throat:  ?   Mouth: Mucous membranes are moist.  ?Eyes:  ?   Extraocular Movements: Extraocular movements intact.  ?   Pupils: Pupils are equal, round, and reactive to light.  ?Cardiovascular:  ?   Rate and Rhythm: Normal rate and regular rhythm.  ?   Pulses: Normal pulses.  ?   Heart sounds: Normal heart sounds.  ?Pulmonary:  ?   Effort: Pulmonary effort is normal.  ?   Breath sounds: Normal breath sounds.  ?Abdominal:  ?   General: Abdomen is  flat. Bowel sounds are normal.  ?   Palpations: Abdomen is soft.  ?Musculoskeletal:     ?   General: Normal range of motion.  ?   Cervical back: Normal range of motion and neck supple.  ?Skin: ?   Genera

## 2021-09-10 NOTE — Plan of Care (Addendum)
Patient admitted s/p debridement of spinal wound. NPWT in place. ? ?Edit: 0445 hrs: BP soft this AM (see flowsheet). NP Olena Heckle notified per protocol. Per NP, BP is "okay for now" but "notify (sic) if it goes any lower". Patient remains asymptomatic and has recently received opiates.  ? ? ?Problem: Education: ?Goal: Knowledge of General Education information will improve ?Description: Including pain rating scale, medication(s)/side effects and non-pharmacologic comfort measures ?Outcome: Progressing ?  ?Problem: Health Behavior/Discharge Planning: ?Goal: Ability to manage health-related needs will improve ?Outcome: Progressing ?  ?Problem: Clinical Measurements: ?Goal: Ability to maintain clinical measurements within normal limits will improve ?Outcome: Progressing ?Goal: Will remain free from infection ?Outcome: Progressing ?Goal: Diagnostic test results will improve ?Outcome: Progressing ?Goal: Respiratory complications will improve ?Outcome: Progressing ?Goal: Cardiovascular complication will be avoided ?Outcome: Progressing ?  ?Problem: Activity: ?Goal: Risk for activity intolerance will decrease ?Outcome: Progressing ?  ?Problem: Nutrition: ?Goal: Adequate nutrition will be maintained ?Outcome: Progressing ?  ?Problem: Coping: ?Goal: Level of anxiety will decrease ?Outcome: Progressing ?  ?Problem: Elimination: ?Goal: Will not experience complications related to bowel motility ?Outcome: Progressing ?Goal: Will not experience complications related to urinary retention ?Outcome: Progressing ?  ?Problem: Pain Managment: ?Goal: General experience of comfort will improve ?Outcome: Progressing ?  ?Problem: Safety: ?Goal: Ability to remain free from injury will improve ?Outcome: Progressing ?  ?Problem: Skin Integrity: ?Goal: Risk for impaired skin integrity will decrease ?Outcome: Progressing ?  ?

## 2021-09-11 ENCOUNTER — Encounter (HOSPITAL_COMMUNITY): Payer: Self-pay | Admitting: Neurological Surgery

## 2021-09-11 DIAGNOSIS — T8149XA Infection following a procedure, other surgical site, initial encounter: Secondary | ICD-10-CM | POA: Diagnosis not present

## 2021-09-11 LAB — BASIC METABOLIC PANEL
Anion gap: 6 (ref 5–15)
BUN: 9 mg/dL (ref 8–23)
CO2: 27 mmol/L (ref 22–32)
Calcium: 8.7 mg/dL — ABNORMAL LOW (ref 8.9–10.3)
Chloride: 107 mmol/L (ref 98–111)
Creatinine, Ser: 1.02 mg/dL — ABNORMAL HIGH (ref 0.44–1.00)
GFR, Estimated: 56 mL/min — ABNORMAL LOW (ref >60)
Glucose, Bld: 126 mg/dL — ABNORMAL HIGH (ref 70–99)
Potassium: 3.9 mmol/L (ref 3.5–5.1)
Sodium: 140 mmol/L (ref 135–145)

## 2021-09-11 LAB — CBC
HCT: 28.1 % — ABNORMAL LOW (ref 36.0–46.0)
Hemoglobin: 9.1 g/dL — ABNORMAL LOW (ref 12.0–15.0)
MCH: 30.6 pg (ref 26.0–34.0)
MCHC: 32.4 g/dL (ref 30.0–36.0)
MCV: 94.6 fL (ref 80.0–100.0)
Platelets: 396 10*3/uL (ref 150–400)
RBC: 2.97 MIL/uL — ABNORMAL LOW (ref 3.87–5.11)
RDW: 14.4 % (ref 11.5–15.5)
WBC: 10.1 10*3/uL (ref 4.0–10.5)
nRBC: 0 % (ref 0.0–0.2)

## 2021-09-11 LAB — C-REACTIVE PROTEIN: CRP: 9.7 mg/dL — ABNORMAL HIGH (ref <1.0)

## 2021-09-11 LAB — SEDIMENTATION RATE: Sed Rate: 78 mm/hr — ABNORMAL HIGH (ref 0–22)

## 2021-09-11 MED ORDER — CEFTRIAXONE SODIUM 2 G IJ SOLR
2.0000 g | INTRAMUSCULAR | Status: DC
  Administered 2021-09-11 – 2021-09-14 (×4): 2 g via INTRAVENOUS
  Filled 2021-09-11 (×4): qty 20

## 2021-09-11 MED ORDER — SODIUM CHLORIDE 0.9 % IV SOLN
8.0000 mg/kg | Freq: Every day | INTRAVENOUS | Status: DC
  Administered 2021-09-11 – 2021-09-12 (×2): 550 mg via INTRAVENOUS
  Filled 2021-09-11 (×5): qty 11

## 2021-09-11 NOTE — Consult Note (Addendum)
? ? ?[facility]  ?                                                                                     ? ?Patient Identification: ?Patient Name: Christy Stephenson MRN: [id number] [place] Date: 09/10/2021 12:57 PM ?Today's Date: 09/11/2021 ?Reason for consult: Lumbar wound infection ?Requesting provider: Kristeen Miss ? ?Principal Problem: ?  Wound infection after surgery ? ? ?Antibiotics:  ?Cefazolin 4/24 ? ?Lines/Hardware: RT TKA 11/2019 ? ?Assessment ?# Post surgical Deep lumbar wound infection involving hardware:  in the setting of surgical decompression and instrumented stabilization from L2 to sacrum  including revision of a previous surgery with pseudoarthrosis of L5-S1l on 3/21 ?- S/p debridement of lumbar wound 4/24 with OR cultures pending. OR note with purulence in the sub cut space, also involving the hardware L5 and sacrum. Bone graft in that area was also involved ( removed), wound vac placed.  ? ?# Recent E coli bacteremia  ?# Medication Monitoring  ? ?Recommendations  ?Start Daptomycin '8mg'$ /kg and ceftriaxone 2g q24 hrs pending OR cultures  ?Will need IV abtx for 6 week ?PICC line  ?Following cultures to completion  ?Monitor CBC, CMP and CPK ? ?Rest of the management as per the primary team. Please call with questions or concerns.  ?Thank you for the consult ? ?Rosiland Oz, MD ?Infectious Disease Physician ?[facility] ?[place] [address] ?[place], [place] [address] ?Phone: [phone]  Fax: [phone] ? ?__________________________________________________________________________________________________________ ?HPI and Hospital Course: ?81 Y O female wiith PMH of COPD/Asthma, COVID 2022, GERD, HLD, Obesity/OSA, Neuropathy, PE, CVA who recently underwent surgical decompression and instrumented stabilization from L2 to sacrum  including revision of a previous surgery  with pseudoarthrosis of L5-S1l on 3/21 who was directly admitted 4/21 for concerns of post op lumbar wound infection. She was recently admitted on 4/4-4/10 for septic shock 2/2 E coli bacteremia, acute respiratory failure 2/2 PE, AKI, acute CHF. E coli bacteremia treated with cefepime( 4/4-4/5)>ceftriaxone( ceftriaxone 4/6-4/7)> cefadroxil 4/8-4/9, Cefdinir 4/10. She was discharged on 8 more pills of cefdinir which she denies taking at home. Back wound was not thought to be the source of bacteremia during that admission. However, there was some note made of drainage from the wound treated with local wound care. Seen by [facility] on 4/14 when noted to have drainage. Started on oral abtx ( cefdinir) for a week however did not resolve the infection and hence admitted for OR. S/p debridement of lumbar wound 4/24 with OR cultures pending. OR note with purulence in the sub cut space, also involving the hardware L5 and sacrum. Bone graft in that area was also involved ( removed), wound vac placed.  ? ?Prior OR procedures  ?# Lumbar surgery 6-7 years ago ( Lumbosacral fusion) ? ?# 08/07/21 Exploration and revision of fixation of the lumbar spine.  Decompression of L1-L2 using a TLIF technique with interbody spacer placed on the right side at the L1-L2 level interbody arthrodesis with autograft allograft and Proteos.  Pedicle fixation L1-S2 and the ileum with robotic assistance in placing pedicle screws in L1 and S2 AI posterior  arthrodesis L4 to sacrum with local autograft allograft and Proteus. ? ?ROS: all systems reviewed with pertinent positives and negatives as listed above ? ?Past Medical History:  ?Diagnosis Date  ? Arthritis   ? osteo  ? Asthma   ? Asthma   ? Basal cell carcinoma   ? face  ? COPD (chronic obstructive pulmonary disease) ([facility])   ? COVID   ? 2022  ? GERD (gastroesophageal reflux disease)   ? Heart palpitations   ? History of hiatal hernia 1991  ? History of iron deficiency anemia   ? History of migraine   ?  while taking birth control pills  ? Hypercholesteremia   ? Neuropathy   ? left greater than right feet  ? Obesity   ? Obstructive sleep apnea 07/09/2012  ? CPAP   ? Peripheral neuropathy   ? Pneumonia   ? as a child  ? PONV (postoperative nausea and vomiting)   ? not since 1967  ? Pulmonary embolism ([place])   ? Shortness of breath   ? with exertion  ? Stroke [facility])   ? TIA (transient ischemic attack) 2014  ? TIA (transient ischemic attack) 2014  ? no residual - no follow up needed  ? 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 ?Past Surgical History:  ?Procedure Laterality Date  ? APPLICATION OF ROBOTIC ASSISTANCE FOR SPINAL PROCEDURE N/A 08/07/2021  ? Procedure: APPLICATION OF ROBOTIC ASSISTANCE FOR SPINAL PROCEDURE;  Surgeon: Kristeen Miss, MD;  Location: [facility];  Service: Neurosurgery;  Laterality: N/A;  ? BREAST BIOPSY Right   ? BREAST CYST EXCISION Left   ? CARDIAC CATHETERIZATION  07/09/2012  ? cataracts Bilateral 2013  ? this was second one  ? CHOLECYSTECTOMY N/A 02/18/2017  ? Procedure: LAPAROSCOPIC CHOLECYSTECTOMY WITH INTRAOPERATIVE CHOLANGIOGRAM;  Surgeon: Erroll Luna, MD;  Location: [facility];  Service: General;  Laterality: N/A;  ? COLONOSCOPY    ? DILATION AND CURETTAGE OF UTERUS    ? EYE SURGERY Bilateral 2014  ? cataract removal 2013 and 2014  ? FINGER SURGERY Right 1971  ? ring finger repair  ? KNEE SURGERY Right   ? arthroscopy  ? LEFT HEART CATHETERIZATION WITH CORONARY ANGIOGRAM N/A 07/09/2012  ? Procedure: LEFT HEART CATHETERIZATION WITH CORONARY ANGIOGRAM;  Surgeon: Sinclair Grooms, MD;  Location: [facility] CATH LAB;  Service: Cardiovascular;  Laterality: N/A;  ? LUMBAR FUSION    ? L3-S1  ? TONSILLECTOMY    ? TOTAL KNEE ARTHROPLASTY Right 12/06/2019  ? Procedure: TOTAL KNEE ARTHROPLASTY;  Surgeon: Gaynelle Arabian, MD;  Location: [facility];  Service: Orthopedics;  Laterality: Right;  29mn  ? TUBAL LIGATION    ? ? ? ?Scheduled Meds: ? apixaban  5 mg Oral BID  ? vitamin C  1,000 mg Oral Daily  ? cholestyramine  4 g  Oral Daily  ? docusate sodium  100 mg Oral BID  ? fluticasone furoate-vilanterol  1 puff Inhalation Daily  ? And  ? umeclidinium bromide  1 puff Inhalation Daily  ? metoprolol succinate  25 mg Oral Daily  ? pantoprazole  40 mg Oral Daily  ? polyvinyl alcohol   Both Eyes BID  ? pregabalin  100 mg Oral Daily  ? And  ? pregabalin  200 mg Oral QHS  ? senna  1 tablet Oral BID  ? sodium chloride flush  3 mL Intravenous Q12H  ? ?Continuous Infusions: ? sodium chloride    ?  ceFAZolin (ANCEF) IV Stopped (09/11/21 0534)  ? lactated ringers  75 mL/hr at 09/11/21 0700  ? methocarbamol (ROBAXIN) IV    ? ?PRN Meds:.acetaminophen **OR** acetaminophen, albuterol, bisacodyl, LORazepam, menthol-cetylpyridinium **OR** phenol, methocarbamol **OR** methocarbamol (ROBAXIN) IV, morphine injection, ondansetron **OR** ondansetron (ZOFRAN) IV, oxyCODONE-acetaminophen, polyethylene glycol, sodium chloride flush, sodium phosphate ? ?Allergies  ?Allergen Reactions  ? Sulfa Antibiotics Other (See Comments) and Palpitations  ? Actifed [Triprolidine-Pse] Palpitations  ? Lipitor [Atorvastatin] Other (See Comments)  ?  Aches and pains  ? Bupropion Hcl Itching and Rash  ? ?Social History  ? ?Socioeconomic History  ? Marital status: Married  ?  Spouse name: Hollice Espy  ? Number of children: 2  ? Years of education: AD  ? Highest education level: Not on file  ?Occupational History  ? Occupation: retired  ?  Employer: [employer]  ?  Comment: AD Nursing  ?Tobacco Use  ? Smoking status: Never  ? Smokeless tobacco: Never  ? Tobacco comments:  ?  Had tried when younger  ?Vaping Use  ? Vaping Use: Never used  ?Substance and Sexual Activity  ? Alcohol use: No  ? Drug use: No  ? Sexual activity: Yes  ?  Birth control/protection: Post-menopausal  ?Other Topics Concern  ? Not on file  ?Social History Narrative  ? Not on file  ? ?Social Determinants of Health  ? ?Financial Resource Strain: Not on file  ?Food Insecurity: Not on file  ?Transportation Needs: Not  on file  ?Physical Activity: Not on file  ?Stress: Not on file  ?Social Connections: Not on file  ?Intimate Partner Violence: Not on file  ? ?Breast Cancer-relatedfamily history is not on file. ? ?Vitals

## 2021-09-11 NOTE — Evaluation (Signed)
Occupational Therapy Evaluation and Discharge ?Patient Details ?Name: Christy Stephenson ?MRN: [id number] ?DOB: 1941-05-16 ?Today's Date: 09/11/2021 ? ? ?History of Present Illness Pt is a 81 y.o. female with recent lumbar sx (08/07/21) and readmission 08/21/21 with PE, AKI, wound drainage, now readmitted 09/10/21 for debridement of lumbar wound infection with wound vac placement. Other PMH includes COPD, OSA, TIA, stroke, asthma, arthritis, neuropathy, obesity, prior back sxs.  ? ?Clinical Impression ?  ?This 81 yo female admitted and underwent above presents to acute OT with all education completed, we will D/C from acute OT.She will have A prn from husband once home and has all needed DME/AE.  ?   ? ?Recommendations for follow up therapy are one component of a multi-disciplinary discharge planning process, led by the attending physician.  Recommendations may be updated based on patient status, additional functional criteria and insurance authorization.  ? ?Follow Up Recommendations ? No OT follow up  ?  ?Assistance Recommended at Discharge Intermittent Supervision/Assistance  ?Patient can return home with the following A little help with bathing/dressing/bathroom;Assistance with cooking/housework ? ?  ?Functional Status Assessment ? Patient has had a recent decline in their functional status and demonstrates the ability to make significant improvements in function in a reasonable and predictable amount of time. (no further skilled OT needs, all education covered on acute care)  ?Equipment Recommendations ? None recommended by OT  ?  ?   ?Precautions / Restrictions Precautions ?Precautions: Fall;Back;Other (comment) ?Precaution Comments: able to recall 3/3 back precautions; lumbar wound vac; bladder incontinence (wears depends at home) ?Restrictions ?Weight Bearing Restrictions: No ?Other Position/Activity Restrictions: no brace required per orders this admission  ? ?  ? ?Mobility Bed Mobility ?  ?  ?  ?  ?  ?  ?  ?General  bed mobility comments: Pt up in recliner upon arrival ?  ? ?Transfers ?Overall transfer level: Modified independent ?Equipment used: None ?Transfers: Sit to/from Stand, Bed to chair/wheelchair/BSC ?Sit to Stand: Modified independent (Device/Increase time) ?Stand pivot transfers: Modified independent (Device/Increase time) ?  ?  ?  ?  ?General transfer comment: good awareness of lines ?  ? ?  ?Balance Overall balance assessment: Needs assistance ?Sitting-balance support: No upper extremity supported, Feet supported ?Sitting balance-Leahy Scale: Good ?  ?  ?Standing balance support: Single extremity supported, During functional activity ?Standing balance-Leahy Scale: Fair ?  ?  ?  ?  ?  ?  ?  ?  ?  ?  ?  ?  ?   ? ?ADL either performed or assessed with clinical judgement  ? ?ADL Overall ADL's : Needs assistance/impaired ?Eating/Feeding: Independent ?  ?Grooming: Set up;Sitting ?  ?Upper Body Bathing: Set up;Sitting ?  ?Lower Body Bathing: Minimal assistance ?Lower Body Bathing Details (indicate cue type and reason): Mod I sit<>stand ?Upper Body Dressing : Set up;Sitting ?  ?Lower Body Dressing: Minimal assistance ?Lower Body Dressing Details (indicate cue type and reason): Mod I sit<>stand ?Toilet Transfer: Modified Independent;Stand-pivot;BSC/3in1 ?  ?Toileting- Clothing Manipulation and Hygiene: Modified independent;Sit to/from stand ?Toileting - Clothing Manipulation Details (indicate cue type and reason): We discussed leaving her purewick in while she stands and turns so that if she is incontient while transfering it will catch it (this happened while she was transferring so it was a good idea). She also can be incontient of bowel so she agreed that she would have to call if this happended in the tranfer to she could get A with getting cleaned up. She was able  to manage lines for transfer and is aware she needs to keep the call bell close by so if she needs A after have gotten to [facility] she can call. ?  ?  ?  ?General  ADL Comments: Asked her to get with her PICC line RN at home and her wound vac RN at home to see if there is a way she can get in the shower safely with each of these items.She will either use reacher or her husband will A with her LBADLs prn. We discussed options other than shower to get her hair washed and potentially when she does a sponge bath to do it at kitchen sink (sit to stand from 3n1) to have more room (since her bathroom is really small)  ? ? ? ?Vision Patient Visual Report: No change from baseline ?   ?   ?   ?   ? ?Pertinent Vitals/Pain Pain Assessment ?Pain Assessment: Faces ?Faces Pain Scale: Hurts a little bit ?Pain Location: back ?Pain Descriptors / Indicators: Sore ?Pain Intervention(s): Limited activity within patient's tolerance, Monitored during session, Repositioned  ? ? ? ?Hand Dominance Right ?  ?Extremity/Trunk Assessment Upper Extremity Assessment ?Upper Extremity Assessment: Overall WFL for tasks assessed ?  ? ?  ?Communication Communication ?Communication: No difficulties ?  ?Cognition Arousal/Alertness: Awake/alert ?Behavior During Therapy: [facility] for tasks assessed/performed ?Overall Cognitive Status: Within Functional Limits for tasks assessed ?  ?  ?  ?  ?  ?  ?  ?  ?  ?  ?  ?  ?  ?  ?  ?  ?  ?  ?  ?   ?   ?   ? ? ?Home Living Family/patient expects to be discharged to:: Private residence ?Living Arrangements: Spouse/significant other ?Available Help at Discharge: Family;Available 24 hours/day ?Type of Home: House ?Home Access: Stairs to enter ?Entrance Stairs-Number of Steps: 20 from garage with stair lift ?  ?Home Layout: One level ?  ?  ?Bathroom Shower/Tub: Walk-in shower ?  ?Bathroom Toilet: Standard ?Bathroom Accessibility: Yes ?How Accessible: Accessible via walker ?Home Equipment: Conservation officer, nature (2 wheels);Shower seat;BSC/3in1;Hand held shower head;Adaptive equipment;Wheelchair - manual ?Adaptive Equipment: Reacher;Long-handled sponge ?  ?  ? ?  ?Prior Functioning/Environment  Prior Level of Function : Independent/Modified Independent ?  ?  ?  ?  ?  ?  ?Mobility Comments: Limited household ambulator with RW, pushed in w/c for community mobility. Had just started working with [facility]. Reports mobility significantly limited past few days leading to admission secondary to weakness and fatigue ?ADLs Comments: Most recently needing to sit to shower, assist from husband for ADLs (LB) secondary to weakness and fatigue ?  ? ?  ?  ?OT Problem List: Decreased strength;Decreased range of motion;Impaired balance (sitting and/or standing);Pain ?  ?   ?   ?OT Goals(Current goals can be found in the care plan section) Acute Rehab OT Goals ?Patient Stated Goal: for back to get better and stay better  ?   ? ?   ?AM-PAC OT "6 Clicks" Daily Activity     ?Outcome Measure Help from another person eating meals?: None ?Help from another person taking care of personal grooming?: A Little ?Help from another person toileting, which includes using toliet, bedpan, or urinal?: A Little (intermittently) ?Help from another person bathing (including washing, rinsing, drying)?: A Little ?Help from another person to put on and taking off regular upper body clothing?: A Little ?Help from another person to put on and taking off  regular lower body clothing?: A Little ?6 Click Score: 19 ?  ?End of Session Equipment Utilized During Treatment:  (none) ?Nurse Communication: Mobility status (up to [facility] by herself with calling if she needs A) ? ?Activity Tolerance: Patient tolerated treatment well ?Patient left: in chair;with call bell/phone within reach (chair alarm not set so she can get up to [facility] by hersefl) ? ?OT Visit Diagnosis: Other abnormalities of gait and mobility (R26.89);Muscle weakness (generalized) (M62.81);Pain ?Pain - part of body:  (back)  ?              ?Time: 9784-7841 ?OT Time Calculation (min): 31 min ?Charges:  OT General Charges ?$OT Visit: 1 Visit ?OT Evaluation ?$OT Eval Moderate Complexity: 1 Mod ?OT  Treatments ?$Self Care/Home Management : 8-22 mins ? ?[place], OTR/L ?Acute Rehab Services ?Pager [phone] ?Office [phone] ? ? ? ?Almon Register ?09/11/2021, 10:23 AM ?

## 2021-09-11 NOTE — Anesthesia Postprocedure Evaluation (Signed)
Anesthesia Post Note ? ?Patient: Christy Stephenson ? ?Procedure(s) Performed: LUMBAR WOUND DEBRIDEMENT ? ?  ? ?Patient location during evaluation: PACU ?Anesthesia Type: General ?Level of consciousness: awake and alert ?Pain management: pain level controlled ?Vital Signs Assessment: post-procedure vital signs reviewed and stable ?Respiratory status: spontaneous breathing, nonlabored ventilation, respiratory function stable and patient connected to nasal cannula oxygen ?Cardiovascular status: blood pressure returned to baseline and stable ?Postop Assessment: no apparent nausea or vomiting ?Anesthetic complications: no ? ? ?No notable events documented. ? ?Last Vitals:  ?Vitals:  ? 09/11/21 0414 09/11/21 0615  ?BP: (!) 96/40 (!) 105/44  ?Pulse: 72 68  ?Resp: 17   ?Temp: 36.9 ?C   ?SpO2: 94%   ?  ?Last Pain:  ?Vitals:  ? 09/11/21 0414  ?TempSrc: Oral  ?PainSc:   ? ?Pain Goal: Patients Stated Pain Goal: 3 (09/10/21 1805) ? ?  ?  ?  ?  ?  ?  ?  ? ?Merlinda Frederick ? ? ? ? ?

## 2021-09-11 NOTE — Progress Notes (Signed)
Patient ID: Christy Stephenson, female   DOB: 17-Apr-1941, 81 y.o.   MRN: [id number] ?Vital signs are stable ?Laboratory studies show a sed rate of 78 down from 110 3 weeks ago ?CRP at 9.7 down from 44 3 weeks ago ?Electrolytes and renal function appears stable ?White count 10.1 with hematocrit of 28 improved from 2 weeks ago ?No organisms seen on Gram stain and cultures are pending ?Appreciate the help of infectious disease ?Patient is doing well and not having significant back pain ?VAC dressing with moderate output ?We will continue to monitor for now ?

## 2021-09-11 NOTE — Evaluation (Signed)
Physical Therapy Evaluation ?Patient Details ?Name: Christy Stephenson ?MRN: [id number] ?DOB: 1941/01/19 ?Today's Date: 09/11/2021 ? ?History of Present Illness ? Pt is a 81 y.o. female with recent lumbar sx (08/07/21) and readmission 08/21/21 with PE, AKI, wound drainage, now readmitted 09/10/21 for debridement of lumbar wound infection with wound vac placement. Other PMH includes COPD, OSA, TIA, stroke, asthma, arthritis, neuropathy, obesity, prior back sxs. ?  ?Clinical Impression ? Pt presents with an overall decrease in functional mobility secondary to above. PTA, pt limited household ambulator with RW, uses wheelchair for community mobility, lives with husband who assists with ADL/iADLs as needed. Reviewed education and back precautions, which pt is familiar with from initial limbar sx 08/07/21; pt had recently started working with [facility]. Today, pt moving well with RW and intermittent min guard for balance, limited by fatigue. Pt would benefit from continued acute PT services to maximize functional mobility and independence prior to d/c with continued HHPT services.  ?   ?Orthostatic BPs ?Sitting 125/71  ?Standing 107/71  ?Post-ambulation 113/56  ?  ? ?Recommendations for follow up therapy are one component of a multi-disciplinary discharge planning process, led by the attending physician.  Recommendations may be updated based on patient status, additional functional criteria and insurance authorization. ? ?Follow Up Recommendations Home health PT ? ?  ?Assistance Recommended at Discharge Intermittent Supervision/Assistance  ?Patient can return home with the following ? A little help with walking and/or transfers;A little help with bathing/dressing/bathroom;Assistance with cooking/housework;Assist for transportation;Help with stairs or ramp for entrance ? ?  ?Equipment Recommendations None recommended by PT  ?Recommendations for Other Services ?    ?  ?Functional Status Assessment Patient has had a recent decline in their  functional status and demonstrates the ability to make significant improvements in function in a reasonable and predictable amount of time.  ? ?  ?Precautions / Restrictions Precautions ?Precautions: Fall;Back;Other (comment) ?Precaution Comments: able to recall 3/3 back precautions at beginning of session; lumbar wound vac; bladder incontinence (wears depends at home) ?Restrictions ?Weight Bearing Restrictions: No  ? ?  ? ?Mobility ? Bed Mobility ?Overal bed mobility: Modified Independent ?Bed Mobility: Rolling, Sidelying to Sit ?  ?  ?  ?  ?  ?General bed mobility comments: mod indep with log roll, bed nearly flat ?  ? ?Transfers ?Overall transfer level: Needs assistance ?Equipment used: Rolling walker (2 wheels) ?Transfers: Sit to/from Stand ?Sit to Stand: Supervision ?  ?  ?  ?  ?  ?General transfer comment: good awareness of hand placement and sequencing ?  ? ?Ambulation/Gait ?Ambulation/Gait assistance: Min guard ?Gait Distance (Feet): 12 Feet ?Assistive device: Rolling walker (2 wheels) ?Gait Pattern/deviations: Step-through pattern, Decreased stride length, Trunk flexed ?Gait velocity: Decreased ?  ?  ?General Gait Details: slow, guarded gait with RW and min guard for balance ? ?Stairs ?  ?  ?  ?  ?  ? ?Wheelchair Mobility ?  ? ?Modified Rankin (Stroke Patients Only) ?  ? ?  ? ?Balance Overall balance assessment: Needs assistance ?Sitting-balance support: Feet supported ?Sitting balance-Leahy Scale: Fair ?  ?  ?Standing balance support: During functional activity, Reliant on assistive device for balance ?Standing balance-Leahy Scale: Poor ?  ?  ?  ?  ?  ?  ?  ?  ?  ?  ?  ?  ?   ? ? ? ?Pertinent Vitals/Pain Pain Assessment ?Pain Assessment: Faces ?Faces Pain Scale: Hurts a little bit ?Pain Location: back ?Pain Descriptors / Indicators: Sore ?Pain  Intervention(s): Monitored during session  ? ? ?Home Living Family/patient expects to be discharged to:: Private residence ?Living Arrangements: Spouse/significant  other ?Available Help at Discharge: Family;Available 24 hours/day ?Type of Home: House ?Home Access: Stairs to enter ?  ?Entrance Stairs-Number of Steps: 20 from garage with stair lift ?  ?Home Layout: One level ?Home Equipment: Conservation officer, nature (2 wheels);Shower seat;BSC/3in1;Hand held shower head;Adaptive equipment;Wheelchair - manual ?   ?  ?Prior Function Prior Level of Function : Independent/Modified Independent ?  ?  ?  ?  ?  ?  ?Mobility Comments: Limited household ambulator with RW, pushed in w/c for community mobility. Had just started working with [facility]. Reports mobility significantly limited past few days leading to admission secondary to weakness and fatigue ?ADLs Comments: Most recently needing to sit to shower, assist from husband for ADLs secondary to weakness and fatigue ?  ? ? ?Hand Dominance  ? Dominant Hand: Right ? ?  ?Extremity/Trunk Assessment  ? Upper Extremity Assessment ?Upper Extremity Assessment: Overall WFL for tasks assessed ?  ? ?Lower Extremity Assessment ?Lower Extremity Assessment: Generalized weakness ?  ? ?Cervical / Trunk Assessment ?Cervical / Trunk Assessment: Back Surgery  ?Communication  ? Communication: No difficulties  ?Cognition Arousal/Alertness: Awake/alert ?Behavior During Therapy: [facility] for tasks assessed/performed ?Overall Cognitive Status: Within Functional Limits for tasks assessed ?  ?  ?  ?  ?  ?  ?  ?  ?  ?  ?  ?  ?  ?  ?  ?  ?  ?  ?  ? ?  ?General Comments General comments (skin integrity, edema, etc.): pt's husband present and supportive, expresses concern regarding need to learn how to do dressing changes, etc. pt denies dizziness with mobility, sitting BP 125/71, standing BP 107/71, post-ambulation BP 113/56. reviewed educ re: precautions, positioning, activity recommendations, importance of mobility (including frequent BLE AROM) ? ?  ?Exercises    ? ?Assessment/Plan  ?  ?PT Assessment Patient needs continued PT services  ?PT Problem List Decreased  strength;Decreased mobility;Decreased activity tolerance;Decreased balance;Cardiopulmonary status limiting activity;Pain ? ?   ?  ?PT Treatment Interventions DME instruction;Gait training;Functional mobility training;Therapeutic activities;Therapeutic exercise;Balance training;Patient/family education;Stair training   ? ?PT Goals (Current goals can be found in the Care Plan section)  ?Acute Rehab PT Goals ?Patient Stated Goal: get infection taken care of ?PT Goal Formulation: With patient ?Time For Goal Achievement: 09/25/21 ?Potential to Achieve Goals: Good ? ?  ?Frequency Min 5X/week ?  ? ? ?Co-evaluation   ?  ?  ?  ?  ? ? ?  ?AM-PAC PT "6 Clicks" Mobility  ?Outcome Measure Help needed turning from your back to your side while in a flat bed without using bedrails?: None ?Help needed moving from lying on your back to sitting on the side of a flat bed without using bedrails?: None ?Help needed moving to and from a bed to a chair (including a wheelchair)?: A Little ?Help needed standing up from a chair using your arms (e.g., wheelchair or bedside chair)?: A Little ?Help needed to walk in hospital room?: A Little ?Help needed climbing 3-5 steps with a railing? : A Little ?6 Click Score: 20 ? ?  ?End of Session   ?Activity Tolerance: Patient tolerated treatment well;Patient limited by fatigue ?Patient left: in chair;with call bell/phone within reach;with chair alarm set ?Nurse Communication: Mobility status ?PT Visit Diagnosis: Unsteadiness on feet (R26.81);Other abnormalities of gait and mobility (R26.89);Muscle weakness (generalized) (M62.81) ?  ? ?Time: 1694-5038 ?PT  Time Calculation (min) (ACUTE ONLY): 20 min ? ? ?Charges:   PT Evaluation ?$PT Eval Moderate Complexity: 1 Mod ?  ?  ?   ?[name], PT, DPT ?Acute Rehabilitation Services  ?Pager [phone] ?Office [phone] ? ?Derry Lory ?09/11/2021, 9:35 AM ? ?

## 2021-09-11 NOTE — Plan of Care (Signed)

## 2021-09-11 NOTE — Progress Notes (Signed)
CPAP setup and at patients bedside. Patient not ready at this time due to family/friend being there. Patients family/friend stated she would place patient on once she left. Informed patient if she needed further assistance to call.  ?

## 2021-09-11 NOTE — Progress Notes (Signed)
Mobility Specialist Progress Note  ? ? 09/11/21 1603  ?Mobility  ?Activity Refused mobility  ? ?Pt requesting to hold off until tomorrow as she feels fatigued and "like I still have some anesthesia in me". Will f/u as schedule permits.  ? ?Hildred Alamin ?Mobility Specialist  ?Primary: 5N M.S. Phone: [phone] ?Secondary: 6N M.S. Phone: [phone] ?  ?

## 2021-09-11 NOTE — Progress Notes (Deleted)
Cardiology Office Note:    Date:  09/11/2021   ID:  Christy Stephenson, DOB March 12, 1941, MRN [id number]  PCP:  Christy Carol, MD   [facility] Prov[facility] Cardiologist:  Christy Burow, MD { Referring MD: Christy Carol, MD   No chief complaint on file. ***  History of Present Illness:    Christy Stephenson is a 81 y.o. female with a hx of TIA 2014, asthma, OSA on CPAP, idiopathic progressive polyneuropathy, memory loss, and morbid obesity. Hx of false positive nuclear stress test in 2014. Heart cath with normal coronaries but occluded innominate artery.   She underwent lumbar decompression surgery 08/07/21 and discharged the following day. Unfortunately, she returned to the ER 08/21/21 with weakness and SOB found to have a PE and AKI - also E coli bacteremia felt related to UTI.  She was treated with eliquis. Mild wound dehiscence was observed and treated with wound care. Echocardiogram with LVEF 30-35%, grade 1 DD and global hypokinesis with moild LVH and mildly reduced RV function. Moderately elevated PASP, mild to moderate MR and mild to moderate TR. Cardiology was consulted. She has chronic dyspnea at baseline exacerbate by COVID 03/2021. GDMT included toprol. Renal function precluded use of ACEI/ARB/ARNI. Gentle diuresis and plan for spiro prior to discharge. CE peaked at 3397, planned for CT coronary outpatient and repeat echo in 3 months.   In follow up with the surgeon, there was increase drainage from wound and she was started on PO ABX without improvement. Hospital admission was recommended for surgical debridement and exploration for deep space infection. She was afebrile. Admitted 09/10/21 for surgical debridement found to have extensive infection requiring wound vac. Wound cultures and ID consulted for 6 weeks of IV ABX.   She presented for follow up.     Chronic systolic and diastolic heart failure Mild to moderate MR, mild to moderate TR Hypertension LVEF 30-35% GDMT: toprol -  consider CT coronary OP - can she lie flat - normal cors by heart cath in 2014   AKI - resolved sCr improved   Hyperlipidemia   PE - per VQ scan - continue eliquis 5 mg BID   Hx of TIA in 2014 - no antiplatelet in the setting of eliquis    Past Medical History:  Diagnosis Date   Arthritis    osteo   Asthma    Asthma    Basal cell carcinoma    face   COPD (chronic obstructive pulmonary disease) ([place])    COVID    2022   GERD (gastroesophageal reflux disease)    Heart palpitations    History of hiatal hernia 1991   History of iron deficiency anemia    History of migraine    while taking birth control pills   Hypercholesteremia    Neuropathy    left greater than right feet   Obesity    Obstructive sleep apnea 07/09/2012   CPAP    Peripheral neuropathy    Pneumonia    as a child   PONV (postoperative nausea and vomiting)    not since 1967   Pulmonary embolism ([place])    Shortness of breath    with exertion   Stroke ([place])    TIA (transient ischemic attack) 2014   TIA (transient ischemic attack) 2014   no residual - no follow up needed   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    Past Surgical History:  Procedure Laterality Date   APPLICATION OF ROBOTIC ASSISTANCE FOR SPINAL PROCEDURE N/A 08/07/2021  Procedure: APPLICATION OF ROBOTIC ASSISTANCE FOR SPINAL PROCEDURE;  Surgeon: Christy Miss, MD;  [facility];  Service: Neurosurgery;  Laterality: N/A;   BREAST BIOPSY Right    BREAST CYST EXCISION Left    CARDIAC CATHETERIZATION  07/09/2012   cataracts Bilateral 2013   this was second one   CHOLECYSTECTOMY N/A 02/18/2017   Procedure: LAPAROSCOPIC CHOLECYSTECTOMY WITH INTRAOPERATIVE CHOLANGIOGRAM;  Surgeon: Christy Luna, MD;  Location: [facility];  Service: General;  Laterality: N/A;   COLONOSCOPY     DILATION AND CURETTAGE OF UTERUS     EYE SURGERY Bilateral 2014   cataract removal 2013 and 2014   FINGER SURGERY Right 1971   ring finger repair   KNEE  SURGERY Right    arthroscopy   LEFT HEART CATHETERIZATION WITH CORONARY ANGIOGRAM N/A 07/09/2012   Procedure: LEFT HEART CATHETERIZATION WITH CORONARY ANGIOGRAM;  Surgeon: Christy Grooms, MD;  Location: [facility] CATH LAB;  Service: Cardiovascular;  Laterality: N/A;   LUMBAR FUSION     L3-S1   TONSILLECTOMY     TOTAL KNEE ARTHROPLASTY Right 12/06/2019   Procedure: TOTAL KNEE ARTHROPLASTY;  Surgeon: Gaynelle Arabian, MD;  Location: [facility];  Service: Orthopedics;  Laterality: Right;  72mn   TUBAL LIGATION      Current Medications: No outpatient medications have been marked as taking for the 09/21/21 encounter (Appointment) with Christy Stephenson     Allergies:   Sulfa antibiotics, Actifed [triprolidine-pse], Lipitor [atorvastatin], and Bupropion hcl   Social History   Socioeconomic History   Marital status: Married    Spouse name: Christy Stephenson  Number of children: 2   Years of education: AD   Highest education level: Not on file  Occupational History   Occupation: retired    [employer] farm [employer]    Comment: AD Nursing  Tobacco Use   Smoking status: Never   Smokeless tobacco: Never   Tobacco comments:    Had tried when younger  Vaping Use   Vaping Use: Never used  Substance and Sexual Activity   Alcohol use: No   Drug use: No   Sexual activity: Yes    Birth control/protection: Post-menopausal  Other Topics Concern   Not on file  Social History Narrative   Not on file   Social Determinants of Health   Financial Resource Strain: Not on file  Food Insecurity: Not on file  Transportation Needs: Not on file  Physical Activity: Not on file  Stress: Not on file  Social Connections: Not on file     Family History: The patient's ***family history includes CVA in her father; Diabetes Mellitus II in her mother; Heart attack in her father; Lung cancer in her father; Tuberculosis in her sister.  ROS:   Please see the history of present illness.    *** All other systems  reviewed and are negative.  EKGs/Labs/Other Studies Reviewed:    The following studies were reviewed today: ***  EKG:  EKG is *** ordered today.  The ekg ordered today demonstrates ***  Recent Labs: 05/07/2021: Pro B Natriuretic peptide (BNP) 60.0 08/21/2021: TSH 0.298 08/23/2021: Magnesium 2.5 08/25/2021: ALT 28; B Natriuretic Peptide 334.4 09/11/2021: BUN 9; Creatinine, Ser 1.02; Hemoglobin 9.1; Platelets 396; Potassium 3.9; Sodium 140  Recent Lipid Panel No results found for: CHOL, TRIG, HDL, CHOLHDL, VLDL, LDLCALC, LDLDIRECT   Risk Assessment/Calculations:   {Does this patient have ATRIAL FIBRILLATION?:[phone]}       Physical Exam:    VS:  There were no  vitals taken for this visit.    Wt Readings from Last 3 Encounters:  09/10/21 205 lb (93 kg)  08/27/21 229 lb 8 oz (104.1 kg)  08/07/21 217 lb (98.4 kg)     GEN: *** Well nourished, well developed in no acute distress HEENT: Normal NECK: No JVD; No carotid bruits LYMPHATICS: No lymphadenopathy CARDIAC: ***RRR, no murmurs, rubs, gallops RESPIRATORY:  Clear to auscultation without rales, wheezing or rhonchi  ABDOMEN: Soft, non-tender, non-distended MUSCULOSKELETAL:  No edema; No deformity  SKIN: Warm and dry NEUROLOGIC:  Alert and oriented x 3 PSYCHIATRIC:  Normal affect   ASSESSMENT:    No diagnosis found. PLAN:    In order of problems listed above:  ***      {Are you ordering a CV Procedure (e.g. stress test, cath, DCCV, TEE, etc)?   Press F2        :[id number]}    Medication Adjustments/Labs and Tests Ordered: Current medicines are reviewed at length with the patient today.  Concerns regarding medicines are outlined above.  No orders of the defined types were placed in this encounter.  No orders of the defined types were placed in this encounter.   There are no Patient Instructions on file for this visit.   Signed, Ledora Bottcher, [place]  09/11/2021 1:31 PM    [facility]

## 2021-09-12 ENCOUNTER — Inpatient Hospital Stay: Payer: Self-pay

## 2021-09-12 DIAGNOSIS — T8149XA Infection following a procedure, other surgical site, initial encounter: Secondary | ICD-10-CM | POA: Diagnosis not present

## 2021-09-12 LAB — BASIC METABOLIC PANEL
Anion gap: 6 (ref 5–15)
BUN: 8 mg/dL (ref 8–23)
CO2: 25 mmol/L (ref 22–32)
Calcium: 8.4 mg/dL — ABNORMAL LOW (ref 8.9–10.3)
Chloride: 107 mmol/L (ref 98–111)
Creatinine, Ser: 0.93 mg/dL (ref 0.44–1.00)
GFR, Estimated: >60 mL/min (ref >60)
Glucose, Bld: 102 mg/dL — ABNORMAL HIGH (ref 70–99)
Potassium: 3.6 mmol/L (ref 3.5–5.1)
Sodium: 138 mmol/L (ref 135–145)

## 2021-09-12 LAB — CK: Total CK: 27 U/L — ABNORMAL LOW (ref 38–234)

## 2021-09-12 MED ORDER — CHLORHEXIDINE GLUCONATE CLOTH 2 % EX PADS
6.0000 | MEDICATED_PAD | Freq: Every day | CUTANEOUS | Status: DC
  Administered 2021-09-12 – 2021-09-13 (×2): 6 via TOPICAL

## 2021-09-12 MED ORDER — SODIUM CHLORIDE 0.9% FLUSH
10.0000 mL | Freq: Two times a day (BID) | INTRAVENOUS | Status: DC
  Administered 2021-09-12 – 2021-09-14 (×5): 10 mL

## 2021-09-12 MED ORDER — SODIUM CHLORIDE 0.9% FLUSH
10.0000 mL | INTRAVENOUS | Status: DC | PRN
  Administered 2021-09-14: 10 mL

## 2021-09-12 NOTE — Progress Notes (Signed)
Mobility Specialist Progress Note  ? ? 09/12/21 1643  ?Mobility  ?Activity Ambulated with assistance in hallway  ?Level of Assistance Contact guard assist, steadying assist  ?Assistive Device Front wheel walker  ?Distance Ambulated (ft) 300 ft  ?Activity Response Tolerated well  ?$Mobility charge 1 Mobility  ? ?Pt received in bed and agreeable. No complaints during walk. Attempted void in BR. Returned to bed with call bell in reach.  ? ?Pt expressed concern about d/c home instead of a SNF because of the care for her wound vac and IV medicine.  ? ?Hildred Alamin ?Mobility Specialist  ?Primary: 5N M.S. Phone: [phone] ?Secondary: 6N M.S. Phone: [phone] ?  ?

## 2021-09-12 NOTE — Plan of Care (Signed)

## 2021-09-12 NOTE — [place] FL2 (Signed)
?[place] MEDICAID FL2 LEVEL OF CARE SCREENING TOOL  ?  ? ?IDENTIFICATION  ?Patient Name: ?Christy Stephenson Birthdate: 1940-07-06 Sex: female Admission Date (Current Location): ?09/10/2021  ?[place] and [place] Number: ? [place] ?  Facility and Address:  ?  ?     Provider Number: ?[id number]  ?Attending Physician Name and Address:  ?Kristeen Miss, MD ? Relative Name and Phone Number:  ?  ?   ?Current Level of Care: ?Hospital Recommended Level of Care: ?[place] Prior Approval Number: ?  ? ?Date Approved/Denied: ?  PASRR Number: ?[id number] A ? ?Discharge Plan: ?SNF ?  ? ?Current Diagnoses: ?Patient Active Problem List  ? Diagnosis Date Noted  ? Wound infection after surgery 09/10/2021  ? Acute combined systolic and diastolic congestive heart failure ([place]) 08/24/2021  ? Bacteremia   ? Hyponatremia   ? Shock ([place]) 08/21/2021  ? Acute respiratory failure with hypoxia ([place])   ? AKI (acute kidney injury) ([place])   ? Nausea   ? Septic shock ([place])   ? 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 OA (osteoarthritis) of knee 12/06/2019  ? 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 Dyspnea 07/09/2012  ? Abnormal finding on cardiovascular stress test 07/09/2012  ? OTHER CHEST PAIN 03/01/2010  ? ELECTROCARDIOGRAM, ABNORMAL 03/01/2010  ? 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 Asthma with bronchitis 02/28/2010  ? Obstructive sleep apnea 02/28/2010  ? MEMORY LOSS 02/28/2010  ? ? ?Orientation RESPIRATION BLADDER Height & Weight   ?  ?Self, Time, Situation, Place ? Normal Continent Weight: 93 kg ?Height:  5' 4.5" (163.8 cm)  ?BEHAVIORAL SYMPTOMS/MOOD NEUROLOGICAL BOWEL NUTRITION STATUS  ?    Continent Diet (refer to  d/c summary)  ?AMBULATORY STATUS COMMUNICATION OF NEEDS Skin   ?Limited Assist Verbally Surgical wounds, Wound Vac (refer to operative note) ?  ?  ?  ?    ?     ?     ? ? ?Personal Care Assistance  Level of Assistance  ?Bathing, Feeding, Dressing Bathing Assistance: Limited assistance ?Feeding assistance: Independent ?Dressing Assistance: Limited assistance ?   ? ?Functional Limitations Info  ?Sight, Hearing, Speech Sight Info: Adequate ?Hearing Info: Adequate ?Speech Info: Adequate  ? ? ?SPECIAL CARE FACTORS FREQUENCY  ?PT (By licensed PT), OT (By licensed OT)   ?  ?PT Frequency: 5x/week, evalute and treat ?OT Frequency: 5x/week, evalute and treat ?  ?  ?  ?   ? ? ?Contractures Contractures Info: Not present  ? ? ?Additional Factors Info  ?Code Status, Allergies Code Status Info: Full Code ?Allergies Info: : Sulfa Antibiotics, Actifed (Triprolidine-pse), Lipitor (Atorvastatin), Bupropion Hcl ?  ?  ?  ?   ? ?Current Medications (09/12/2021):  This is the current hospital active medication list ?Current Facility-Administered Medications  ?Medication Dose Route Frequency Provider Last Rate Last Admin  ? 0.9 %  sodium chloride infusion  250 mL Intravenous Continuous Kristeen Miss, MD      ? acetaminophen (TYLENOL) tablet 650 mg  650 mg Oral Q4H PRN Kristeen Miss, MD      ? Or  ? acetaminophen (TYLENOL) suppository 650 mg  650 mg Rectal Q4H PRN Kristeen Miss, MD      ? albuterol (PROVENTIL) (2.5 MG/3ML) 0.083% nebulizer solution 2.5 mg  2.5 mg Nebulization Q6H PRN Kristeen Miss, MD      ? apixaban (ELIQUIS) tablet 5 mg  5 mg Oral BID Kristeen Miss,  MD   5 mg at 09/12/21 1033  ? ascorbic acid (VITAMIN C) tablet 1,000 mg  1,000 mg Oral Daily Kristeen Miss, MD   1,000 mg at 09/12/21 1040  ? bisacodyl (DULCOLAX) suppository 10 mg  10 mg Rectal Daily PRN Kristeen Miss, MD      ? cefTRIAXone (ROCEPHIN) 2 g in sodium chloride 0.9 % 100 mL IVPB  2 g Intravenous Q24H Rosiland Oz, MD 200 mL/hr at 09/12/21 1035 2 g at 09/12/21 1035  ? Chlorhexidine Gluconate Cloth 2 % PADS 6 each  6 each Topical Daily Kristeen Miss, MD   6 each at 09/12/21 1526  ? cholestyramine (QUESTRAN) packet 4 g  4 g Oral Daily Kristeen Miss, MD    4 g at 09/12/21 1033  ? DAPTOmycin (CUBICIN) 550 mg in sodium chloride 0.9 % IVPB  8 mg/kg (Adjusted) Intravenous Q2000 Rosiland Oz, MD 122 mL/hr at 09/11/21 1606 550 mg at 09/11/21 1606  ? docusate sodium (COLACE) capsule 100 mg  100 mg Oral BID Kristeen Miss, MD   100 mg at 09/12/21 1033  ? fluticasone furoate-vilanterol (BREO ELLIPTA) 100-25 MCG/ACT 1 puff  1 puff Inhalation Daily Kristeen Miss, MD   1 puff at 09/12/21 0835  ? And  ? umeclidinium bromide (INCRUSE ELLIPTA) 62.5 MCG/ACT 1 puff  1 puff Inhalation Daily Kristeen Miss, MD   1 puff at 09/12/21 [phone]  ? lactated ringers infusion   Intravenous Continuous Kristeen Miss, MD   Stopped at 09/12/21 0404  ? LORazepam (ATIVAN) tablet 0.25 mg  0.25 mg Oral Daily PRN Kristeen Miss, MD      ? menthol-cetylpyridinium (CEPACOL) lozenge 3 mg  1 lozenge Oral PRN Kristeen Miss, MD      ? Or  ? phenol (CHLORASEPTIC) mouth spray 1 spray  1 spray Mouth/Throat PRN Kristeen Miss, MD      ? methocarbamol (ROBAXIN) tablet 500 mg  500 mg Oral Q6H PRN Kristeen Miss, MD   500 mg at 09/12/21 1152  ? Or  ? methocarbamol (ROBAXIN) 500 mg in dextrose 5 % 50 mL IVPB  500 mg Intravenous Q6H PRN Kristeen Miss, MD      ? metoprolol succinate (TOPROL-XL) 24 hr tablet 25 mg  25 mg Oral Daily Kristeen Miss, MD      ? morphine (PF) 2 MG/ML injection 2 mg  2 mg Intravenous Q2H PRN Kristeen Miss, MD      ? ondansetron (ZOFRAN) tablet 4 mg  4 mg Oral Q6H PRN Kristeen Miss, MD      ? Or  ? ondansetron (ZOFRAN) injection 4 mg  4 mg Intravenous Q6H PRN Kristeen Miss, MD      ? oxyCODONE-acetaminophen (PERCOCET/ROXICET) 5-325 MG per tablet 1-2 tablet  1-2 tablet Oral Q4H PRN Kristeen Miss, MD   2 tablet at 09/12/21 1152  ? pantoprazole (PROTONIX) EC tablet 40 mg  40 mg Oral Daily Kristeen Miss, MD   40 mg at 09/12/21 1033  ? polyethylene glycol (MIRALAX / GLYCOLAX) packet 17 g  17 g Oral Daily PRN Kristeen Miss, MD      ? polyvinyl alcohol (LIQUIFILM TEARS) 1.4 % ophthalmic solution   Both  Eyes BID Kristeen Miss, MD   Given at 09/12/21 1032  ? pregabalin (LYRICA) capsule 100 mg  100 mg Oral Daily Kristeen Miss, MD   100 mg at 09/12/21 1033  ? And  ? pregabalin (LYRICA) capsule 200 mg  200 mg Oral QHS Kristeen Miss, MD   200 mg at  09/11/21 2132  ? senna (SENOKOT) tablet 8.6 mg  1 tablet Oral BID Kristeen Miss, MD   8.6 mg at 09/12/21 1033  ? sodium chloride flush (NS) 0.9 % injection 10-40 mL  10-40 mL Intracatheter Q12H Kristeen Miss, MD   10 mL at 09/12/21 1526  ? sodium chloride flush (NS) 0.9 % injection 10-40 mL  10-40 mL Intracatheter PRN Kristeen Miss, MD      ? sodium chloride flush (NS) 0.9 % injection 3 mL  3 mL Intravenous Q12H Kristeen Miss, MD   3 mL at 09/12/21 1039  ? sodium chloride flush (NS) 0.9 % injection 3 mL  3 mL Intravenous PRN Kristeen Miss, MD      ? sodium phosphate (FLEET) 7-19 GM/118ML enema 1 enema  1 enema Rectal Once PRN Kristeen Miss, MD      ? ? ? ?Discharge Medications: ?Please see discharge summary for a list of discharge medications. ? ?Relevant Imaging Results: ? ?Relevant Lab Results: ? ? ?Additional Information ?SS# [id number], LT IV ABX ? ?Sharin Mons, RN ? ? ? ? ?

## 2021-09-12 NOTE — Progress Notes (Signed)
?   09/12/21 1245  ?Clinical Encounter Type  ?Visited With Family;Patient;Health care provider ?(Husband Christy Stephenson)  ?Visit Type Social support;Initial  ?Referral From Nurse  ?Consult/Referral To Chaplain ?Albertina Parr [place])  ?Spiritual Encounters  ?Spiritual Needs Emotional  ? ?Chaplain was introduced to Christy Stephenson by nurse on unit 5N. ?Escorted Christy Stephenson to [place] on first floor and back to patient's room 5N24. Chaplain provided compassionate and empathetic listening to Christy Stephenson concerns. ?Christy Stephenson told me about the concerning post-operative infection and medical complications that his wife has experienced since her lumbar surgery on 08/07/2021. Christy Stephenson was concerned that his wife was not well enough to go home yet and that he is afraid that they will send her home again before she is well enough.  ?Christy Stephenson, Christy Poot., [phone]. ?

## 2021-09-12 NOTE — Progress Notes (Signed)
Peripherally Inserted Central Catheter Placement ? ?The IV Nurse has discussed with the patient and/or persons authorized to consent for the patient, the purpose of this procedure and the potential benefits and risks involved with this procedure.  The benefits include less needle sticks, lab draws from the catheter, and the patient may be discharged home with the catheter. Risks include, but not limited to, infection, bleeding, blood clot (thrombus formation), and puncture of an artery; nerve damage and irregular heartbeat and possibility to perform a PICC exchange if needed/ordered by physician.  Alternatives to this procedure were also discussed.  Bard Power PICC patient education guide, fact sheet on infection prevention and patient information card has been provided to patient /or left at bedside.   ? ?PICC Placement Documentation  ?PICC Single Lumen 04/09/61 Left Cephalic 43 cm 0 cm (Active)  ?Indication for Insertion or Continuance of Line Home intravenous therapies (PICC only) 09/12/21 1300  ?Exposed Catheter (cm) 0 cm 09/12/21 1300  ?Site Assessment Clean, Dry, Intact 09/12/21 1300  ?Line Status Flushed;Saline locked;Blood return noted 09/12/21 1300  ?Dressing Type Securing device;Transparent 09/12/21 1300  ?Dressing Status Antimicrobial disc in place 09/12/21 1300  ?Safety Lock Not Applicable 44/69/50 7225  ?Line Care Connections checked and tightened 09/12/21 1300  ?Dressing Intervention New dressing 09/12/21 1300  ?Dressing Change Due 09/19/21 09/12/21 1300  ? ? ? ? ? ?Holley Bouche Renee ?09/12/2021, 1:42 PM ? ?

## 2021-09-12 NOTE — Progress Notes (Signed)
Mobility Specialist Progress Note  ? ? 09/12/21 1137  ?Mobility  ?Activity Ambulated with assistance to bathroom  ?Level of Assistance Standby assist, set-up cues, supervision of patient - no hands on  ?Assistive Device Front wheel walker  ?Distance Ambulated (ft) 24 ft ?(12+12)  ?Activity Response Tolerated well  ?$Mobility charge 1 Mobility  ? ?Pt received and agreeable. Had small BM. C/o 8/10 pain. Returned to bed with call bell in reach. RN notified.  ? ?Christy Stephenson ?Mobility Specialist  ?Primary: 5N M.S. Phone: [phone] ?Secondary: 6N M.S. Phone: [phone] ?  ?

## 2021-09-12 NOTE — TOC Initial Note (Signed)
Transition of Care (TOC) - Initial/Assessment Note  ? ? ?Patient Details  ?Name: Christy Stephenson ?MRN: [id number] ?Date of Birth: Nov 24, 1940 ? ?[facility] of [facility]) CM/SW Contact:    ?Sharin Mons, RN ?Phone Number: ?09/12/2021, 2:17 PM ? ?Clinical Narrative:      ?Readmitted for debridement of lumbar wound infection.          ?NCM spoke with pt regarding d/c planning needs. Pt requesting SNF placement. States husband can't help with IV ABX therapy, or assist with wound vac. NCM explained [place] nurse will be responsible for wound vac drg changes and will assist with the care of the line to administer the antibiotic. Pt states she or the husband doesn't want the responsibility and want to go to SNF, adamantly. States she has already made contact with [facility], [phone]. NCM discussed insurance authorization process and Medicare SNF ratings list will be provided by [facility] team. Patient expressed being hopeful for rehab and to feel better soon. No further questions reported at this time. RNCM to continue to follow and assist with discharge planning needs.  ? ?Prior to admit  pt active with [facility]. ? ?Expected Discharge Plan: [place] (vs  SNF) ?Barriers to Discharge: Continued Medical Work up ? ? ?Patient Goals and CMS Choice ?  ?  ?  ? ?Expected Discharge Plan and Services ?Expected Discharge Plan: [place] (vs  SNF) ?  ?  ?  ?  ?                ?  ?  ?  ?  ?  ?  ?  ?  ?  ?  ? ?Prior Living Arrangements/Services ?  ?  ?  ?       ?  ?  ?  ?  ? ?Activities of Daily Living ?Home Assistive Devices/Equipment: Cane (specify quad or straight), Walker (specify type), Raised toilet seat with rails, Shower chair with back, Grab bars around toilet, Blood pressure cuff, Shower chair without back ?ADL Screening (condition at time of admission) ?Patient's cognitive ability adequate to safely complete daily activities?: Yes ?Is the patient deaf or have  difficulty hearing?: No ?Does the patient have difficulty seeing, even when wearing glasses/contacts?: No ?Does the patient have difficulty concentrating, remembering, or making decisions?: No ?Patient able to express need for assistance with ADLs?: Yes ?Does the patient have difficulty dressing or bathing?: No ?Independently performs ADLs?: Yes (appropriate for developmental age) ?Does the patient have difficulty walking or climbing stairs?: Yes ?Weakness of Legs: Right ?Weakness of Arms/Hands: None ? ?Permission Sought/Granted ?  ?  ?   ?   ?   ?   ? ?Emotional Assessment ?  ?  ?  ?  ?  ?  ? ?Admission diagnosis:  Wound infection after surgery [T81.49XA] ?Patient Active Problem List  ? Diagnosis Date Noted  ? Wound infection after surgery 09/10/2021  ? Acute combined systolic and diastolic congestive heart failure ([place]) 08/24/2021  ? Bacteremia   ? Hyponatremia   ? Shock ([place]) 08/21/2021  ? Acute respiratory failure with hypoxia ([place])   ? AKI (acute kidney injury) ([place])   ? Nausea   ? Septic shock ([place])   ? 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 OA (osteoarthritis) of knee 12/06/2019  ? 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 Dyspnea 07/09/2012  ?  Abnormal finding on cardiovascular stress test 07/09/2012  ? OTHER CHEST PAIN 03/01/2010  ? ELECTROCARDIOGRAM, ABNORMAL 03/01/2010  ? 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 Asthma with bronchitis 02/28/2010  ? Obstructive sleep apnea 02/28/2010  ? MEMORY LOSS 02/28/2010  ? ?PCP:  Seward Carol, MD ?Pharmacy:   ?[place], [place] - [address] ?[place] ?[place] [address] ?Phone: [phone] Fax: [phone] ? ?[facility] ?[address] ?[place] [address] ?Phone: [phone] Fax: [phone] ? ? ? ? ?Social Determinants of Health (SDOH) Interventions ?  ? ?Readmission  Risk Interventions ?   ? View : No data to display.  ?  ?  ?  ? ? ? ?

## 2021-09-12 NOTE — Progress Notes (Signed)
? ?[facility] Infectious Diseases Follow Up Note ? ?Patient Identification: ?Patient Name: Christy Stephenson MRN: [id number] [place] Date: 09/10/2021 12:57 PM ?Age: 81 y.o.Today's Date: 09/12/2021 ? ?Reason for Visit: Follow up on lumbar wound infection ? ?Principal Problem: ?  Wound infection after surgery ? ?Antibiotics:  ?Cefazolin 4/24 ?  ?Lines/Hardware: RT TKA 11/2019 ?  ?Interval Events: continues to be afebrile ? ? 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 OR note with purulence in the sub cut space, also involving the hardware L5 and sacrum. Bone graft in that area was also involved ( removed), wound vac placed.  ? ?# Access - will need PICC and order it. She tells me she cannot have picc in rt arm as there is difficulty with ? insertion ?# Recent E coli bacteremia  ?# Medication Monitoring  ? ? ?Recommendations ?Continue Daptomycin and ceftriaxone pending OR cultures  ?Needs PICC line for long term IV abxt ( ordered for left arm) ?Fu OR cultures  ?Monitor CBC, CMP and CPK ?Discussed with Neurosurgery ?Following  ? ?Rest of the management as per the primary team. ?Thank you for the consult. Please page with pertinent questions or concerns. ? ?______________________________________________________________________ ?Subjective ?patient seen and examined at the bedside.  ?No complains ? ?Vitals ?BP (!) 96/41 (BP Location: Right Arm)   Pulse 70   Temp 98.5 ?F (36.9 ?C) (Oral)   Resp 17   Ht 5' 4.5" (1.638 m)   Wt 93 kg   SpO2 98%   BMI 34.64 kg/m?  ? ?  ?Physical Exam ?Constitutional:  sitting up in the chair  ?   Comments:  ? ?Cardiovascular:  ?   Rate and Rhythm: Normal rate and  regular rhythm.  ?   Heart sounds:  ? ?Pulmonary:  ?   Effort: Pulmonary effort is normal on RA ?   Comments:  ? ?Abdominal:  ?   Palpations: Abdomen is soft.  ?   Tenderness: Non distended  ? ?Musculoskeletal:     ?   General: No swelling or tenderness.  ? ?Skin: ?   Comments: Lumbar wound with wound vac + ? ?Neurological:  ?   General: Grossly non focal, awake, alert and oriented  ? ?Psychiatric:     ?   Mood and Affect: Mood normal.  ? ?Pertinent Microbiology ?Results for orders placed or performed during the hospital encounter of 09/10/21  ?Aerobic Culture w Gram Stain (superficial specimen)     Status: None (Preliminary result)  ? Collection Time: 09/10/21  3:20 PM  ?Result Value Ref Range Status  ? Specimen Description LUMBAR  Final  ? Special Requests SURGICAL WOUND  Final  ? Gram Stain   Final  ?  FEW WBC PRESENT, PREDOMINANTLY PMN ?NO ORGANISMS SEEN ?  ? Culture   Final  ?  NO GROWTH < 24 HOURS ?Performed at [facility], [place] [address], [place], [place] [address] ?  ? Report Status PENDING  Incomplete  ? ?Pertinent Lab. ? ?  Latest Ref Rng & Units 09/11/2021  ?  2:22 AM 08/27/2021  ?  1:37 AM 08/26/2021  ?  3:57 AM  ?CBC  ?WBC 4.0 - 10.5 K/uL 10.1   12.7   11.4    ?  Hemoglobin 12.0 - 15.0 g/dL 9.1   8.2   8.3    ?Hematocrit 36.0 - 46.0 % 28.1   24.9   26.2    ?Platelets 150 - 400 K/uL 396   103   74    ? ? ?  Latest Ref Rng & Units 09/12/2021  ?  2:25 AM 09/11/2021  ?  2:22 AM 08/27/2021  ?  1:37 AM  ?CMP  ?Glucose 70 - 99 mg/dL 102   126   128    ?BUN 8 - 23 mg/dL '8   9   21    '$ ?Creatinine 0.44 - 1.00 mg/dL 0.93   1.02   1.13    ?Sodium 135 - 145 mmol/L 138   140   137    ?Potassium 3.5 - 5.1 mmol/L 3.6   3.9   4.0    ?Chloride 98 - 111 mmol/L 107   107   105    ?CO2 22 - 32 mmol/L '25   27   27    '$ ?Calcium 8.9 - 10.3 mg/dL 8.4   8.7   7.9    ? ? ? ?Pertinent Imaging today ?Plain films and CT images have been personally visualized and interpreted; radiology reports have been reviewed. Decision making  incorporated into the Impression / Recommendations. ? ?No results found. ? ?I spent more than 35 minutes for this patient encounter including review of prior medical records, coordination of care with primary/other specialist with greater than 50% of time being face to face/counseling and discussing diagnostics/treatment plan with the patient/family. ? ?Electronically signed by:  ? ?Rosiland Oz, MD ?Infectious Disease Physician ?[facility] ?Pager: [phone] ? ?

## 2021-09-12 NOTE — Progress Notes (Signed)
Pt states she does not want to wear the CPAP tonight. Will call if she changes her mind. ?

## 2021-09-12 NOTE — Consult Note (Signed)
? ?  [facility] CM Inpatient Consult ? ? ?09/12/2021 ? ?Clois Comber ?1941-03-13 ?[id number] ? ?[place] Organization [ACO] Patient: Medicare ACO Reach ? ?Primary Care Provider:  Seward Carol, MD ?Is an Independent Embedded provider ? ?Patient screened for less than 30 day readmission hospitalization for unplanned readmission risk  to assess for Embedded follow up. Came by to speak with patient regarding PCP needs, patient is currently in a sterile procedure. ?Plan:  Continue to follow progress and disposition to assess for post hospital care management needs.   ? ?For questions contact:  ? ?Natividad Brood, RN BSN CCM ?[facility] Liaison ? [phone] business mobile phone ?Toll free office [phone]  ?Fax number: [phone] ?[email or web address]  ? ? ? ?

## 2021-09-12 NOTE — Progress Notes (Signed)
Physical Therapy Treatment ?Patient Details ?Name: Christy Stephenson ?MRN: [id number] ?DOB: [birth year] ?Today's Date: 09/12/2021 ? ? ?History of Present Illness Pt is a 81 y.o. female with recent lumbar sx (08/07/21) and readmission 08/21/21 with PE, AKI, wound drainage, now readmitted 09/10/21 for debridement of lumbar wound infection with wound vac placement. Other PMH includes COPD, OSA, TIA, stroke, asthma, arthritis, neuropathy, obesity, prior back sxs. ?  ?PT Comments  ? ? Pt progressing with mobility. Pt able to ambulate with RW and perform seated/standing ADL tasks at supervision-level; pt with good awareness of activity tolerance and lumbar precautions. Encouraged more frequent OOB activity with mobility specialists and nursing staff. Will continue to follow acutely to address established goals.  ? ?Sitting BP - 127/76 ?Post-mobility BP - 107/68 ?   ?Recommendations for follow up therapy are one component of a multi-disciplinary discharge planning process, led by the attending physician.  Recommendations may be updated based on patient status, additional functional criteria and insurance authorization. ? ?Follow Up Recommendations ? Home health PT ?  ?  ?Assistance Recommended at Discharge Intermittent Supervision/Assistance  ?Patient can return home with the following A little help with bathing/dressing/bathroom;Assistance with cooking/housework;Assist for transportation;Help with stairs or ramp for entrance ?  ?Equipment Recommendations ? None recommended by PT  ?  ?Recommendations for Other Services   ? ? ?  ?Precautions / Restrictions Precautions ?Precautions: Fall;Back;Other (comment) ?Precaution Comments: able to recall 3/3 back precautions; lumbar wound vac; bladder incontinence (wears depends at home) ?Restrictions ?Weight Bearing Restrictions: No ?Other Position/Activity Restrictions: no brace required per orders this admission  ?  ? ?Mobility ? Bed Mobility ?Overal bed mobility: Independent ?Bed Mobility:  Rolling, Sidelying to Sit ?  ?  ?  ?  ?  ?General bed mobility comments: able to perform log roll towards R-side with bed flat ?  ? ?Transfers ?Overall transfer level: Modified independent ?Equipment used: Rolling walker (2 wheels) ?Transfers: Sit to/from Stand ?  ?  ?  ?  ?  ?  ?General transfer comment: able to stand from EOB and BSC (over toilet) to RW mod indep ?  ? ?Ambulation/Gait ?Ambulation/Gait assistance: Supervision ?Gait Distance (Feet): 26 Feet ?Assistive device: Rolling walker (2 wheels) ?Gait Pattern/deviations: Step-through pattern, Decreased stride length, Trunk flexed ?Gait velocity: Decreased ?  ?  ?General Gait Details: slow, guarded gait with RW and supervision for safety/lines; further ambulation deferred as pt fatigued post-ADL tasks ? ? ?Stairs ?  ?  ?  ?  ?  ? ? ?Wheelchair Mobility ?  ? ?Modified Rankin (Stroke Patients Only) ?  ? ? ?  ?Balance Overall balance assessment: Needs assistance ?Sitting-balance support: No upper extremity supported, Feet supported ?Sitting balance-Leahy Scale: Good ?Sitting balance - Comments: able to doff depends and perform pericare sitting on BSC without assist; requires assist to reach feet (doff/don socks, don depends, wash lower legs/feet) ?  ?Standing balance support: No upper extremity supported, During functional activity ?Standing balance-Leahy Scale: Fair ?Standing balance comment: prolonged standing at sink to perform ADL tasks (brushing teeth, washing face, brushing hair) ?  ?  ?  ?  ?  ?  ?  ?  ?  ?  ?  ?  ? ?  ?Cognition Arousal/Alertness: Awake/alert ?Behavior During Therapy: [facility] for tasks assessed/performed ?Overall Cognitive Status: Within Functional Limits for tasks assessed ?  ?  ?  ?  ?  ?  ?  ?  ?  ?  ?  ?  ?  ?  ?  ?  ?  ?  ?  ? ?  ?  Exercises   ? ?  ?General Comments General comments (skin integrity, edema, etc.): pt's husband Hollice Espy) present and supportive; reviewed education and activity recommendations; pt with good awareness of back  precautions ?  ?  ? ?Pertinent Vitals/Pain Pain Assessment ?Pain Assessment: Faces ?Faces Pain Scale: Hurts little more ?Pain Location: back ?Pain Descriptors / Indicators: Sore ?Pain Intervention(s): Monitored during session, Limited activity within patient's tolerance  ? ? ?Home Living   ?  ?  ?  ?  ?  ?  ?  ?  ?  ?   ?  ?Prior Function    ?  ?  ?   ? ?PT Goals (current goals can now be found in the care plan section) Progress towards PT goals: Progressing toward goals ? ?  ?Frequency ? ? ? Min 5X/week ? ? ? ?  ?PT Plan Current plan remains appropriate  ? ? ?Co-evaluation   ?  ?  ?  ?  ? ?  ?AM-PAC PT "6 Clicks" Mobility   ?Outcome Measure ? Help needed turning from your back to your side while in a flat bed without using bedrails?: None ?Help needed moving from lying on your back to sitting on the side of a flat bed without using bedrails?: None ?Help needed moving to and from a bed to a chair (including a wheelchair)?: None ?Help needed standing up from a chair using your arms (e.g., wheelchair or bedside chair)?: None ?Help needed to walk in hospital room?: A Little ?Help needed climbing 3-5 steps with a railing? : A Little ?6 Click Score: 22 ? ?  ?End of Session   ?Activity Tolerance: Patient tolerated treatment well ?Patient left: in chair;with call bell/phone within reach ?Nurse Communication: Mobility status ?PT Visit Diagnosis: Unsteadiness on feet (R26.81);Other abnormalities of gait and mobility (R26.89);Muscle weakness (generalized) (M62.81) ?  ? ? ?Time: 6195-0932 ?PT Time Calculation (min) (ACUTE ONLY): 34 min ? ?Charges:  $Therapeutic Activity: 23-37 mins          ?          ? ?[name], PT, DPT ?Acute Rehabilitation Services  ?Pager [phone] ?Office [phone] ? ?Derry Lory ?09/12/2021, 10:56 AM ? ?

## 2021-09-12 NOTE — Progress Notes (Signed)
Patient unable to tolerate CPAP and stated she would try it again tomorrow night. Patient resting comfortably. ?

## 2021-09-13 DIAGNOSIS — T8149XA Infection following a procedure, other surgical site, initial encounter: Secondary | ICD-10-CM | POA: Diagnosis not present

## 2021-09-13 NOTE — Progress Notes (Signed)
RT refusing cpap for the night. ?

## 2021-09-13 NOTE — TOC Progression Note (Addendum)
Transition of Care (TOC) - Progression Note  ? ? ?Patient Details  ?Name: Christy Stephenson ?MRN: [id number] ?Date of Birth: [birth year] ? ?Transition of Care (TOC) CM/SW Contact  ?Joanne Chars, LCSW ?Phone Number: ?09/13/2021, 10:11 AM ? ?Clinical Narrative:   CSW received call back from [facility], they can review referral, asked that it be faxed to [phone], which was done. ? ?1030: [facility] had questions regarding the wound vac, RNCM Levada Dy will call to clarify. ? ?Expected Discharge Plan: [place] (vs  SNF) ?Barriers to Discharge: Continued Medical Work up ? ?Expected Discharge Plan and Services ?Expected Discharge Plan: [place] (vs  SNF) ?  ?  ?  ?  ?                ?  ?  ?  ?  ?  ?  ?  ?  ?  ?  ? ? ?Social Determinants of Health (SDOH) Interventions ?  ? ?Readmission Risk Interventions ?   ? View : No data to display.  ?  ?  ?  ? ? ?

## 2021-09-13 NOTE — Care Management Important Message (Signed)
Important Message ? ?Patient Details  ?Name: Christy Stephenson ?MRN: [id number] ?Date of Birth: 06/08/1940 ? ? ?Medicare Important Message Given:  Yes ? ? ? ? ?Christy Stephenson ?09/13/2021, 4:01 PM ?

## 2021-09-13 NOTE — Progress Notes (Signed)
Physical Therapy Treatment ?Patient Details ?Name: Christy Stephenson ?MRN: [id number] ?DOB: [birth year] ?Today's Date: 09/13/2021 ? ? ?History of Present Illness Pt is a 80 y.o. female with recent lumbar sx (08/07/21) and readmission 08/21/21 with PE, AKI, wound drainage, now readmitted 09/10/21 for debridement of lumbar wound infection with wound vac placement. Other PMH includes COPD, OSA, TIA, stroke, asthma, arthritis, neuropathy, obesity, prior back sxs. ?  ?PT Comments  ? ? Pt progressing with mobility. Today's session focused on ambulation for improving strength and activity tolerance. Pt planning to d/c to SNF for medical management of IV antibiotics and wound vac. If to remain admitted, will continue to follow acutely. ?   ?Recommendations for follow up therapy are one component of a multi-disciplinary discharge planning process, led by the attending physician.  Recommendations may be updated based on patient status, additional functional criteria and insurance authorization. ? ?Follow Up Recommendations ? Home health PT ?  ?  ?Assistance Recommended at Discharge Intermittent Supervision/Assistance  ?Patient can return home with the following A little help with bathing/dressing/bathroom;Assistance with cooking/housework;Assist for transportation;Help with stairs or ramp for entrance ?  ?Equipment Recommendations ? None recommended by PT  ?  ?Recommendations for Other Services   ? ? ?  ?Precautions / Restrictions Precautions ?Precautions: Fall;Back;Other (comment) ?Precaution Booklet Issued: Yes (comment) ?Precaution Comments: able to recall 3/3 back precautions; lumbar wound vac; bladder incontinence (wears depends at home) ?Required Braces or Orthoses: Spinal Brace ?Spinal Brace: Lumbar corset ?Restrictions ?Weight Bearing Restrictions: No ?Other Position/Activity Restrictions: no brace required per orders this admission  ?  ? ?Mobility ? Bed Mobility ?  ?  ?  ?  ?  ?  ?  ?General bed mobility comments: received  sitting in recliner ?  ? ?Transfers ?Overall transfer level: Modified independent ?Equipment used: Rolling walker (2 wheels) ?Transfers: Sit to/from Stand ?  ?  ?  ?  ?  ?  ?General transfer comment: 3x sit<>stand during session to RW, mod indep, reliant on UE support to push to stand ?  ? ?Ambulation/Gait ?Ambulation/Gait assistance: Supervision ?Gait Distance (Feet): 256 Feet (+ 280) ?Assistive device: Rolling walker (2 wheels) ?Gait Pattern/deviations: Step-through pattern, Decreased stride length, Trunk flexed ?Gait velocity: Decreased ?  ?  ?General Gait Details: slow, guarded gait with RW and supervision for safety/lines; 1x prolonged seated rest break ? ? ?Stairs ?  ?  ?  ?  ?  ? ? ?Wheelchair Mobility ?  ? ?Modified Rankin (Stroke Patients Only) ?  ? ? ?  ?Balance Overall balance assessment: Needs assistance ?Sitting-balance support: No upper extremity supported, Feet supported ?Sitting balance-Leahy Scale: Good ?  ?  ?Standing balance support: No upper extremity supported, During functional activity ?Standing balance-Leahy Scale: Fair ?  ?  ?  ?  ?  ?  ?  ?  ?  ?  ?  ?  ?  ? ?  ?Cognition Arousal/Alertness: Awake/alert ?Behavior During Therapy: [facility] for tasks assessed/performed ?Overall Cognitive Status: Within Functional Limits for tasks assessed ?  ?  ?  ?  ?  ?  ?  ?  ?  ?  ?  ?  ?  ?  ?  ?  ?  ?  ?  ? ?  ?Exercises   ? ?  ?General Comments General comments (skin integrity, edema, etc.): pt's family present and supportive; pt with good awareness of maintaining back precautions; hopeful for d/c to SNF for management of IV antibiotics and wound vac ?  ?  ? ?  Pertinent Vitals/Pain Pain Assessment ?Pain Assessment: Faces ?Faces Pain Scale: Hurts a little bit ?Pain Location: back ?Pain Descriptors / Indicators: Sore ?Pain Intervention(s): Monitored during session  ? ? ?Home Living   ?  ?  ?  ?  ?  ?  ?  ?  ?  ?   ?  ?Prior Function    ?  ?  ?   ? ?PT Goals (current goals can now be found in the care plan  section) Progress towards PT goals: Progressing toward goals ? ?  ?Frequency ? ? ? Min 5X/week ? ? ? ?  ?PT Plan Current plan remains appropriate  ? ? ?Co-evaluation   ?  ?  ?  ?  ? ?  ?AM-PAC PT "6 Clicks" Mobility   ?Outcome Measure ? Help needed turning from your back to your side while in a flat bed without using bedrails?: None ?Help needed moving from lying on your back to sitting on the side of a flat bed without using bedrails?: None ?Help needed moving to and from a bed to a chair (including a wheelchair)?: None ?Help needed standing up from a chair using your arms (e.g., wheelchair or bedside chair)?: None ?Help needed to walk in hospital room?: A Little ?Help needed climbing 3-5 steps with a railing? : A Little ?6 Click Score: 22 ? ?  ?End of Session   ?Activity Tolerance: Patient tolerated treatment well ?Patient left: in chair;with call bell/phone within reach;with family/visitor present ?Nurse Communication: Mobility status ?PT Visit Diagnosis: Unsteadiness on feet (R26.81);Other abnormalities of gait and mobility (R26.89);Muscle weakness (generalized) (M62.81) ?  ? ? ?Time: 1338-1400 ?PT Time Calculation (min) (ACUTE ONLY): 22 min ? ?Charges:  $Therapeutic Exercise: 8-22 mins          ?          ? ?Christy Stephenson, PT, DPT ?Acute Rehabilitation Services  ?Pager [phone] ?Office [phone] ? ?Christy Stephenson ?09/13/2021, 3:09 PM ? ?

## 2021-09-13 NOTE — Progress Notes (Signed)
PHARMACY CONSULT NOTE FOR: ? ?OUTPATIENT  PARENTERAL ANTIBIOTIC THERAPY (OPAT) ? ?Indication: E.coli Lumbar Wound Infection ?Regimen: Cefazolin 2g IV every 8 hours ?End date: 10/22/21 ? ?IV antibiotic discharge orders are pended. ?To discharging provider:  please sign these orders via discharge navigator,  ?Select New Orders & click on the button choice - Manage This Unsigned Work.  ?  ? ?Thank you for allowing pharmacy to be a part of this patient?s care. ? ?Alycia Rossetti, PharmD, BCPS ?Infectious Diseases Clinical Pharmacist ?09/13/2021 11:31 AM  ? ?**Pharmacist phone directory can now be found on [email or web address] ([email or web address]).  Listed under [place].  ?

## 2021-09-13 NOTE — Plan of Care (Signed)

## 2021-09-13 NOTE — Plan of Care (Signed)
  Problem: Health Behavior/Discharge Planning: Goal: Ability to manage health-related needs will improve Outcome: Progressing   Problem: Activity: Goal: Risk for activity intolerance will decrease Outcome: Progressing   Problem: Nutrition: Goal: Adequate nutrition will be maintained Outcome: Progressing   Problem: Pain Managment: Goal: General experience of comfort will improve Outcome: Progressing   

## 2021-09-13 NOTE — Progress Notes (Signed)
? ?[facility] Infectious Diseases Follow Up Note ? ?Patient Identification: ?Patient Name: Christy Stephenson MRN: [id number] [place] Date: 09/10/2021 12:57 PM ?Age: 81 y.o.Today's Date: 09/13/2021 ? ?Reason for Visit: Follow up on lumbar wound infection ? ?Principal Problem: ?  Wound infection after surgery ? ?Antibiotics:  ?Cefazolin 4/24 ?  ?Lines/Hardware: RT TKA 11/2019 ?  ?Interval Events: continues to be afebrile ? ? 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 OR note with purulence in the sub cut space, also involving the hardware L5 and sacrum. Bone graft in that area was also involved ( removed), wound vac placed.  ? ?# Access - LEFT arm PICC + ?# Recent E coli bacteremia  ?# Medication Monitoring  ? ? ?Recommendations ?DC Daptomycin, continue ceftriaxone as is ?Fu OR cultures to completion for ID of GNR and sensi  ?Monitor CBC, CMP ?Plan for 6 weeks of IV abtx from 4/24 ?There is plan for wound vac change later today vs tomorrow ?Following disposition, patient is willing to go to SNF ?Will arrange for ID fu on discharge ? ?Rest of the management as per the primary team. ?Thank you for the consult. Please page with pertinent questions or concerns. ? ?______________________________________________________________________ ?Subjective ?patient seen and examined at the bedside.  ?No concerns ?Husband at bedside  ? ?Vitals ?BP 126/62 (BP Location: Right Arm)   Pulse (!) 111   Temp 99.1 ?F (37.3 ?C) (Oral)   Resp 14   Ht 5' 4.5" (1.638 m)   Wt 93 kg   SpO2 96%   BMI 34.64 kg/m?  ? ?  ?Physical Exam ?Constitutional:  sitting up in the chair  ?   Comments:  ? ?Cardiovascular:  ?   Rate and Rhythm:  Normal rate and regular rhythm.  ?   Heart sounds:  ? ?Pulmonary:  ?   Effort: Pulmonary effort is normal on RA ?   Comments:  ? ?Abdominal:  ?   Palpations: Abdomen is soft.  ?   Tenderness: Non distended  ? ?Musculoskeletal:     ?   General: No swelling or tenderness.  ? ?Skin: ?   Comments: Lumbar wound with wound vac + ? ?Neurological:  ?   General: Grossly non focal, awake, alert and oriented  ? ?Psychiatric:     ?   Mood and Affect: Mood normal.  ? ?Pertinent Microbiology ?Results for orders placed or performed during the hospital encounter of 09/10/21  ?Aerobic Culture w Gram Stain (superficial specimen)     Status: None (Preliminary result)  ? Collection Time: 09/10/21  3:20 PM  ?Result Value Ref Range Status  ? Specimen Description LUMBAR  Final  ? Special Requests SURGICAL WOUND  Final  ? Gram Stain   Final  ?  FEW WBC PRESENT, PREDOMINANTLY PMN ?NO ORGANISMS SEEN ?  ? Culture   Final  ?  RARE GRAM NEGATIVE RODS ?CULTURE REINCUBATED FOR BETTER GROWTH ?Performed at [facility], [place] [address] [place], [place] [address] ?  ? Report Status PENDING  Incomplete  ? ?Pertinent Lab. ? ?  Latest Ref Rng & Units 09/11/2021  ?  2:22 AM 08/27/2021  ?  1:37 AM 08/26/2021  ?  3:57 AM  ?CBC  ?WBC 4.0 - 10.5 K/uL 10.1  12.7   11.4    ?Hemoglobin 12.0 - 15.0 g/dL 9.1   8.2   8.3    ?Hematocrit 36.0 - 46.0 % 28.1   24.9   26.2    ?Platelets 150 - 400 K/uL 396   103   74    ? ? ?  Latest Ref Rng & Units 09/12/2021  ?  2:25 AM 09/11/2021  ?  2:22 AM 08/27/2021  ?  1:37 AM  ?CMP  ?Glucose 70 - 99 mg/dL 102   126   128    ?BUN 8 - 23 mg/dL '8   9   21    '$ ?Creatinine 0.44 - 1.00 mg/dL 0.93   1.02   1.13    ?Sodium 135 - 145 mmol/L 138   140   137    ?Potassium 3.5 - 5.1 mmol/L 3.6   3.9   4.0    ?Chloride 98 - 111 mmol/L 107   107   105    ?CO2 22 - 32 mmol/L '25   27   27    '$ ?Calcium 8.9 - 10.3 mg/dL 8.4   8.7   7.9    ? ? ? ?Pertinent Imaging today ?Plain films and CT images have been personally visualized and interpreted;  radiology reports have been reviewed. Decision making incorporated into the Impression / Recommendations. ? ?[place] EKG SITE RITE ? ?Result Date: 09/12/2021 ?If [employer] not attached, placement could not be confirmed due to current cardiac rhythm.  ? ?I spent more than 35 minutes for this patient encounter including review of prior medical records, coordination of care with primary/other specialist with greater than 50% of time being face to face/counseling and discussing diagnostics/treatment plan with the patient/family. ? ?Electronically signed by:  ? ?Rosiland Oz, MD ?Infectious Disease Physician ?[facility] ?Pager: [phone] ? ?

## 2021-09-13 NOTE — Progress Notes (Signed)
Mobility Specialist Progress Note  ? ? 09/13/21 1047  ?Mobility  ?Activity Ambulated with assistance in hallway  ?Level of Assistance Contact guard assist, steadying assist  ?Assistive Device Front wheel walker  ?Distance Ambulated (ft) 100 ft ?(40+60)  ?Activity Response Tolerated fair  ?$Mobility charge 1 Mobility  ? ?Pt received in chair and agreeable. C/o feeling some nausea, pain, and tiredness. Took x1 seated rest break. Returned to BR to attempt BM, unsuccessful. Left in chair with call bell in reach. RN notified.  ? ?Hildred Alamin ?Mobility Specialist  ?Primary: 5N M.S. Phone: [phone] ?Secondary: 6N M.S. Phone: [phone] ?  ?

## 2021-09-14 DIAGNOSIS — G609 Hereditary and idiopathic neuropathy, unspecified: Secondary | ICD-10-CM | POA: Diagnosis not present

## 2021-09-14 DIAGNOSIS — Z792 Long term (current) use of antibiotics: Secondary | ICD-10-CM | POA: Diagnosis not present

## 2021-09-14 DIAGNOSIS — Z8673 Personal history of transient ischemic attack (TIA), and cerebral infarction without residual deficits: Secondary | ICD-10-CM | POA: Diagnosis not present

## 2021-09-14 DIAGNOSIS — G4733 Obstructive sleep apnea (adult) (pediatric): Secondary | ICD-10-CM | POA: Diagnosis not present

## 2021-09-14 DIAGNOSIS — D649 Anemia, unspecified: Secondary | ICD-10-CM | POA: Diagnosis not present

## 2021-09-14 DIAGNOSIS — Z9989 Dependence on other enabling machines and devices: Secondary | ICD-10-CM | POA: Diagnosis not present

## 2021-09-14 DIAGNOSIS — B9620 Unspecified Escherichia coli [E. coli] as the cause of diseases classified elsewhere: Secondary | ICD-10-CM | POA: Diagnosis not present

## 2021-09-14 DIAGNOSIS — E02 Subclinical iodine-deficiency hypothyroidism: Secondary | ICD-10-CM | POA: Diagnosis not present

## 2021-09-14 DIAGNOSIS — Z7951 Long term (current) use of inhaled steroids: Secondary | ICD-10-CM | POA: Diagnosis not present

## 2021-09-14 DIAGNOSIS — I2699 Other pulmonary embolism without acute cor pulmonale: Secondary | ICD-10-CM | POA: Diagnosis not present

## 2021-09-14 DIAGNOSIS — E7800 Pure hypercholesterolemia, unspecified: Secondary | ICD-10-CM | POA: Diagnosis not present

## 2021-09-14 DIAGNOSIS — L920 Granuloma annulare: Secondary | ICD-10-CM | POA: Diagnosis not present

## 2021-09-14 DIAGNOSIS — J45909 Unspecified asthma, uncomplicated: Secondary | ICD-10-CM | POA: Diagnosis not present

## 2021-09-14 DIAGNOSIS — Z743 Need for continuous supervision: Secondary | ICD-10-CM | POA: Diagnosis not present

## 2021-09-14 DIAGNOSIS — Z7901 Long term (current) use of anticoagulants: Secondary | ICD-10-CM | POA: Diagnosis not present

## 2021-09-14 DIAGNOSIS — K7581 Nonalcoholic steatohepatitis (NASH): Secondary | ICD-10-CM | POA: Diagnosis not present

## 2021-09-14 DIAGNOSIS — L089 Local infection of the skin and subcutaneous tissue, unspecified: Secondary | ICD-10-CM | POA: Diagnosis not present

## 2021-09-14 DIAGNOSIS — N179 Acute kidney failure, unspecified: Secondary | ICD-10-CM | POA: Diagnosis not present

## 2021-09-14 DIAGNOSIS — G603 Idiopathic progressive neuropathy: Secondary | ICD-10-CM | POA: Diagnosis not present

## 2021-09-14 DIAGNOSIS — T8149XD Infection following a procedure, other surgical site, subsequent encounter: Secondary | ICD-10-CM | POA: Diagnosis not present

## 2021-09-14 DIAGNOSIS — Z20822 Contact with and (suspected) exposure to covid-19: Secondary | ICD-10-CM | POA: Diagnosis not present

## 2021-09-14 DIAGNOSIS — Z4789 Encounter for other orthopedic aftercare: Secondary | ICD-10-CM | POA: Diagnosis not present

## 2021-09-14 DIAGNOSIS — K219 Gastro-esophageal reflux disease without esophagitis: Secondary | ICD-10-CM | POA: Diagnosis not present

## 2021-09-14 DIAGNOSIS — Z882 Allergy status to sulfonamides status: Secondary | ICD-10-CM | POA: Diagnosis not present

## 2021-09-14 DIAGNOSIS — D71 Functional disorders of polymorphonuclear neutrophils: Secondary | ICD-10-CM | POA: Diagnosis not present

## 2021-09-14 DIAGNOSIS — R7881 Bacteremia: Secondary | ICD-10-CM | POA: Diagnosis not present

## 2021-09-14 DIAGNOSIS — T8149XA Infection following a procedure, other surgical site, initial encounter: Secondary | ICD-10-CM | POA: Diagnosis not present

## 2021-09-14 DIAGNOSIS — K760 Fatty (change of) liver, not elsewhere classified: Secondary | ICD-10-CM | POA: Diagnosis not present

## 2021-09-14 DIAGNOSIS — J479 Bronchiectasis, uncomplicated: Secondary | ICD-10-CM | POA: Diagnosis not present

## 2021-09-14 DIAGNOSIS — Z981 Arthrodesis status: Secondary | ICD-10-CM | POA: Diagnosis not present

## 2021-09-14 DIAGNOSIS — Z79890 Hormone replacement therapy (postmenopausal): Secondary | ICD-10-CM | POA: Diagnosis not present

## 2021-09-14 DIAGNOSIS — D500 Iron deficiency anemia secondary to blood loss (chronic): Secondary | ICD-10-CM | POA: Diagnosis not present

## 2021-09-14 DIAGNOSIS — I5042 Chronic combined systolic (congestive) and diastolic (congestive) heart failure: Secondary | ICD-10-CM | POA: Diagnosis not present

## 2021-09-14 DIAGNOSIS — J449 Chronic obstructive pulmonary disease, unspecified: Secondary | ICD-10-CM | POA: Diagnosis not present

## 2021-09-14 DIAGNOSIS — J8410 Pulmonary fibrosis, unspecified: Secondary | ICD-10-CM | POA: Diagnosis not present

## 2021-09-14 DIAGNOSIS — Z79899 Other long term (current) drug therapy: Secondary | ICD-10-CM | POA: Diagnosis not present

## 2021-09-14 DIAGNOSIS — M1711 Unilateral primary osteoarthritis, right knee: Secondary | ICD-10-CM | POA: Diagnosis not present

## 2021-09-14 DIAGNOSIS — I5041 Acute combined systolic (congestive) and diastolic (congestive) heart failure: Secondary | ICD-10-CM | POA: Diagnosis not present

## 2021-09-14 DIAGNOSIS — R531 Weakness: Secondary | ICD-10-CM | POA: Diagnosis not present

## 2021-09-14 DIAGNOSIS — Z9889 Other specified postprocedural states: Secondary | ICD-10-CM | POA: Diagnosis not present

## 2021-09-14 DIAGNOSIS — J439 Emphysema, unspecified: Secondary | ICD-10-CM | POA: Diagnosis not present

## 2021-09-14 LAB — AEROBIC CULTURE W GRAM STAIN (SUPERFICIAL SPECIMEN)

## 2021-09-14 MED ORDER — APIXABAN 5 MG PO TABS: 5.0000 mg | ORAL_TABLET | Freq: Two times a day (BID) | ORAL | 3 refills | Status: DC

## 2021-09-14 MED ORDER — CEFAZOLIN IV (FOR PTA / DISCHARGE USE ONLY): 2.0000 g | Freq: Three times a day (TID) | INTRAVENOUS | 0 refills | Status: AC

## 2021-09-14 MED ORDER — HEPARIN SOD (PORK) LOCK FLUSH 100 UNIT/ML IV SOLN
250.0000 [IU] | INTRAVENOUS | Status: AC | PRN
  Administered 2021-09-14: 250 [IU]
  Filled 2021-09-14: qty 2.5

## 2021-09-14 NOTE — Progress Notes (Signed)
Patient ID: Christy Stephenson, female   DOB: Jan 24, 1941, 81 y.o.   MRN: [id number] ?Vital signs are stable ?VAC dressing change yesterday demonstrates that the wound appears to be healing nicely.  I placed a smaller sponge in the wound.  The patient would like to transfer to skilled nursing facility in [place] near her home.  I believe that she would require 1 VAC dressing change at the end of next week.  I would like to see her on Wednesday of the following week to evaluate the wound and consider whether we can do a secondary closure at that point.  She will need continued IV antibiotics during this course.  If transfer to the skilled nursing facility could be arranged today that would be of great help.  We will see what we can do to affect that. ? ?Patient requires significant help with mobilization and self-care which her husband cannot provide. ?

## 2021-09-14 NOTE — Discharge Summary (Signed)
Physician Discharge Summary  ?Patient ID: ?[place] ?MRN: [id number] ?DOB/AGE: February 28, 1941 81 y.o. ? ?Admit date: 09/10/2021 ?Discharge date: 09/14/2021 ? ?Admission Diagnoses: Lumbar wound infection status post decompression arthrodesis L3 to pelvis ? ?Discharge Diagnoses: Lumbar wound infection.  History of fusion L3 to pelvis 08/07/2021 ?Principal Problem: ?  Wound infection after surgery ? ? ?Discharged Condition: fair ? ?Hospital Course: Patient was admitted to undergo surgical debridement of lumbar wound infection.  It was found that the infection extended down to the region of the hardware on the right side.  A VAC dressing was placed.  She had been monitored in the hospital with the [facility] drainage gradually decreasing.  Dressing change demonstrated that the wound appeared to be healing nicely.  The patient is being maintained on IV Ancef and antibiotic orders have been placed.  Wound VAC dressing change will be performed at the facility by the wound care nurse with a small VAC sponge being placed on 09/20/2021.  I will see the patient in my clinic on 09/26/2021 to evaluate the wound and plan further care. ? ?Consults:  Infectious diseases ? ?Significant Diagnostic Studies: None ? ?Treatments: surgery: See op note ? ?Discharge Exam: ?Blood pressure (!) 106/52, pulse 91, temperature 98.5 ?F (36.9 ?C), temperature source Oral, resp. rate 16, height 5' 4.5" (1.638 m), weight 93 kg, SpO2 96 %. ?Lumbar wound is 10 cm in length with depth through the fascia to previously exposed hardware packing has been withdrawn to the fascial level so that granulation can cover the hardware in the lateral gutter.  A wound VAC dressing is in place.Patient's neurologic status allows her to ambulate with assistance. ? ?Disposition: Discharge disposition: 03-Skilled Nursing Facility ? ? ? ? ? ? ?Discharge Instructions   ? ? [facility] pharmacist to adjust dose for Vancomycin, Aminoglycosides and other anti-infective therapies  as requested by physician.   Complete by: As directed ?  ? [facility] to provide Cath Flo 69m   Complete by: As directed ?  ? Administer for PICC line occlusion and as ordered by physician for other access device issues.  ? Anaphylaxis Kit: Provided to treat any anaphylactic reaction to the medication being provided to the patient if First Dose or when requested by physician   Complete by: As directed ?  ? Epinephrine 141mml vial / amp: Administer 0.30m61m0.30ml58mubcutaneously once for moderate to severe anaphylaxis, nurse to call physician and pharmacy when reaction occurs and call 911 if needed for immediate care  ? Diphenhydramine 50mg36mIV vial: Administer 25-50mg 109mM PRN for first dose reaction, rash, itching, mild reaction, nurse to call physician and pharmacy when reaction occurs  ? Sodium Chloride 0.9% NS 500ml I27mdminister if needed for hypovolemic blood pressure drop or as ordered by physician after call to physician with anaphylactic reaction  ? Call MD for:  redness, tenderness, or signs of infection (pain, swelling, redness, odor or green/yellow discharge around incision site)   Complete by: As directed ?  ? Call MD for:  severe uncontrolled pain   Complete by: As directed ?  ? Call MD for:  temperature >100.4   Complete by: As directed ?  ? Change dressing on IV access line weekly and PRN   Complete by: As directed ?  ? Diet - low sodium heart healthy   Complete by: As directed ?  ? Discharge wound care:   Complete by: As directed ?  ? Patient to continue wound vac with vac dressing  change to be done on Thursday 5/4 with small vac sponge being placed into wound by wound care nurse at facility. I will evaluate and do vac dressing change in office on 5/10.  ? Flush IV access with Sodium Chloride 0.9% and Heparin 10 units/ml or 100 units/ml   Complete by: As directed ?  ? Home infusion instructions - [facility]   Complete by: As directed ?  ? Instructions: Flush IV access with  Sodium Chloride 0.9% and Heparin 10units/ml or 100units/ml  ? Change dressing on IV access line: Weekly and PRN  ? Instructions Cath Flo 29m: Administer for PICC Line occlusion and as ordered by physician for other access device  ? [facility] pharmacist to adjust dose for: Vancomycin, Aminoglycosides and other anti-infective therapies as requested by physician  ? Incentive spirometry RT   Complete by: As directed ?  ? Increase activity slowly   Complete by: As directed ?  ? Method of administration may be changed at the discretion of home infusion pharmacist based upon assessment of the patient and/or caregiver?s ability to self-administer the medication ordered   Complete by: As directed ?  ? ?  ? ?Allergies as of 09/14/2021   ? ?   Reactions  ? Sulfa Antibiotics Other (See Comments), Palpitations  ? Actifed [triprolidine-pse] Palpitations  ? Lipitor [atorvastatin] Other (See Comments)  ? Aches and pains  ? Bupropion Hcl Itching, Rash  ? ?  ? ?  ?Medication List  ?  ? ?STOP taking these medications   ? ?cefdinir 300 MG capsule ?Commonly known as: OMNICEF ?  ?cephALEXin 500 MG capsule ?Commonly known as: KEFLEX ?  ? ?  ? ?TAKE these medications   ? ?albuterol 108 (90 Base) MCG/ACT inhaler ?Commonly known as: VENTOLIN HFA ?Inhale 2 puffs into the lungs every 6 (six) hours as needed for wheezing or shortness of breath. ?  ?apixaban 5 MG Tabs tablet ?Commonly known as: ELIQUIS ?Take 1 tablet (5 mg total) by mouth 2 (two) times daily. ?What changed:  ?how much to take ?how to take this ?when to take this ?additional instructions ?  ?CALCIUM-VITAMIN D PO ?Take 1 tablet by mouth in the morning and at bedtime. ?  ?ceFAZolin  IVPB ?Commonly known as: ANCEF ?Inject 2 g into the vein every 8 (eight) hours. Indication:  E.coli lumbar wound infection ?First Dose: Yes ?Last Day of Therapy:  10/22/21 ?Labs - Once weekly:  CBC/D and BMP, ?Labs - Every other week:  ESR and CRP ?Method of administration: IV Push ?Method of  administration may be changed at the discretion of home infusion pharmacist based upon assessment of the patient and/or caregiver's ability to self-administer the medication ordered. ?  ?cholestyramine 4 g packet ?Commonly known as: QUESTRAN ?Take 4 g by mouth daily. ?  ?methocarbamol 500 MG tablet ?Commonly known as: ROBAXIN ?Take 1 tablet (500 mg total) by mouth every 6 (six) hours as needed for muscle spasms. ?  ?metoprolol succinate 25 MG 24 hr tablet ?Commonly known as: TOPROL-XL ?Take 1 tablet (25 mg total) by mouth daily. ?  ?multivitamin with minerals Tabs tablet ?Take 1 tablet by mouth daily. ?  ?NEXIUM 24HR PO ?Take 22.3 mg by mouth in the morning and at bedtime. ?  ?ondansetron 4 MG tablet ?Commonly known as: ZOFRAN ?Take 1 tablet (4 mg total) by mouth every 6 (six) hours as needed for nausea or vomiting. ?  ?oxyCODONE-acetaminophen 5-325 MG tablet ?Commonly known as: PERCOCET/ROXICET ?Take 1-2 tablets  by mouth every 4 (four) hours as needed for moderate pain or severe pain. ?What changed: reasons to take this ?  ?pregabalin 100 MG capsule ?Commonly known as: LYRICA ?Take 100-200 mg by mouth See admin instructions. Take 100 mg by mouth in the morning and 200 mg by mouth in the evening ?  ?SYSTANE BALANCE OP ?Place 1 drop into both eyes in the morning and at bedtime. ?  ?Trelegy Ellipta 100-62.5-25 MCG/ACT Aepb ?Generic drug: Fluticasone-Umeclidin-Vilant ?INHALE 1 PUFF ONCE DAILY ?What changed: See the new instructions. ?  ?vitamin C 1000 MG tablet ?Take 1,000 mg by mouth daily. ?  ?Vitamin D 125 MCG (5000 UT) Caps ?Take 5,000 Units by mouth daily. ?  ?zinc gluconate 50 MG tablet ?Take 50 mg by mouth daily. ?  ? ?  ? ?  ?  ? ? ?  ?Discharge Care Instructions  ?(From admission, onward)  ?  ? ? ?  ? ?  Start     Ordered  ? 09/14/21 0000  Change dressing on IV access line weekly and PRN  (Home infusion instructions - [facility] )       ? 09/14/21 0905  ? 09/14/21 0000  Discharge wound care:        ?Comments: Patient to continue wound vac with vac dressing change to be done on Thursday 5/4 with small vac sponge being placed into wound by wound care nurse at facility. I will evaluate and do vac

## 2021-09-14 NOTE — TOC Transition Note (Signed)
Transition of Care (TOC) - CM/SW Discharge Note ? ? ?Patient Details  ?Name: Christy Stephenson ?MRN: [id number] ?Date of Birth: February 10, 1941 ? ?Transition of Care [facility]) CM/SW Contact:  ?Joanne Chars, LCSW ?Phone Number: ?09/14/2021, 1:19 PM ? ? ?Clinical Narrative:   Pt discharging to [facility] rehab room 1100.  RN call [phone] for report.  ? ? ? ?Final next level of care: [place] ?Barriers to Discharge: Barriers Resolved ? ? ?Patient Goals and CMS Choice ?  ?  ?  ? ?Discharge Placement ?  ?           ?Patient chooses bed at:  [facility] rehab) ?Patient to be transferred to facility by: [facility] ?Name of family member notified: husband Hollice Espy in room ?Patient and family notified of of transfer: 09/14/21 ? ?Discharge Plan and Services ?  ?  ?           ?  ?  ?  ?  ?  ?  ?  ?  ?  ?  ? ?Social Determinants of Health (SDOH) Interventions ?  ? ? ?Readmission Risk Interventions ?   ? View : No data to display.  ?  ?  ?  ? ? ? ? ? ?

## 2021-09-14 NOTE — Progress Notes (Signed)
Mobility Specialist Progress Note  ? ? 09/14/21 1008  ?Mobility  ?Activity Ambulated with assistance in hallway  ?Level of Assistance Standby assist, set-up cues, supervision of patient - no hands on  ?Assistive Device Front wheel walker  ?Distance Ambulated (ft) 400 ft  ?Activity Response Tolerated well  ?$Mobility charge 1 Mobility  ? ?Pt received in chair and agreeable. Rated back pain 6-7/10. Upon return attempted BM, unsuccessful. Left in chair with call bell in reach and RN present.  ? ?Hildred Alamin ?Mobility Specialist  ?Primary: 5N M.S. Phone: [phone] ?Secondary: 6N M.S. Phone: [phone] ?  ?

## 2021-09-14 NOTE — Plan of Care (Signed)

## 2021-09-14 NOTE — Progress Notes (Addendum)
Update ? ?VAC dressing change yesterday demonstrated that the wound appeared to be healing nicely ? ?OPAT ? ?Diagnosis: Deep lumbar infection associated with hardware ? ?Culture Result: E coil ? ?Allergies  ?Allergen Reactions  ? Sulfa Antibiotics Other (See Comments) and Palpitations  ? Actifed [Triprolidine-Pse] Palpitations  ? Lipitor [Atorvastatin] Other (See Comments)  ?  Aches and pains  ? Bupropion Hcl Itching and Rash  ? ? ?OPAT Orders ?Discharge antibiotics to be given via PICC line ?Discharge antibiotics: cefazolin  ?Per pharmacy protocol  ?Duration: 6 weeks  ?End Date: 10/22/21 ? ?[facility] Care Per Protocol: ? ?Home health RN for IV administration and teaching; PICC line care and labs.   ? ?Labs weekly while on IV antibiotics: ?X__ CBC with differential ?X__ BMP ?__ CMP ?__ CRP and ESR every other week ?__ Vancomycin trough ?__ CK ? ?__ Please pull PIC at completion of IV antibiotics ?X__ Please leave PIC in place until doctor has seen patient or been notified ? ?Fax weekly labs to [phone] ? ?Clinic Follow Up Appt: 3-4 weeks  ? ?Rosiland Oz, MD ?Infectious Disease Physician ?[facility] ?[address] ?[place], [place] [address] ?Phone: [phone]  Fax: [phone] ? ? ?

## 2021-09-21 ENCOUNTER — Ambulatory Visit: Payer: Medicare Other | Admitting: Physician Assistant

## 2021-09-27 DIAGNOSIS — I5041 Acute combined systolic (congestive) and diastolic (congestive) heart failure: Secondary | ICD-10-CM | POA: Diagnosis not present

## 2021-09-27 DIAGNOSIS — G603 Idiopathic progressive neuropathy: Secondary | ICD-10-CM | POA: Diagnosis not present

## 2021-09-27 DIAGNOSIS — J45909 Unspecified asthma, uncomplicated: Secondary | ICD-10-CM | POA: Diagnosis not present

## 2021-09-27 DIAGNOSIS — R7881 Bacteremia: Secondary | ICD-10-CM | POA: Diagnosis not present

## 2021-09-27 DIAGNOSIS — G4733 Obstructive sleep apnea (adult) (pediatric): Secondary | ICD-10-CM | POA: Diagnosis not present

## 2021-09-27 DIAGNOSIS — Z981 Arthrodesis status: Secondary | ICD-10-CM | POA: Diagnosis not present

## 2021-09-27 DIAGNOSIS — B9620 Unspecified Escherichia coli [E. coli] as the cause of diseases classified elsewhere: Secondary | ICD-10-CM | POA: Diagnosis not present

## 2021-09-27 DIAGNOSIS — Z79890 Hormone replacement therapy (postmenopausal): Secondary | ICD-10-CM | POA: Diagnosis not present

## 2021-09-27 DIAGNOSIS — K219 Gastro-esophageal reflux disease without esophagitis: Secondary | ICD-10-CM | POA: Diagnosis not present

## 2021-09-27 DIAGNOSIS — Z8673 Personal history of transient ischemic attack (TIA), and cerebral infarction without residual deficits: Secondary | ICD-10-CM | POA: Diagnosis not present

## 2021-09-27 DIAGNOSIS — Z7901 Long term (current) use of anticoagulants: Secondary | ICD-10-CM | POA: Diagnosis not present

## 2021-09-27 DIAGNOSIS — E02 Subclinical iodine-deficiency hypothyroidism: Secondary | ICD-10-CM | POA: Diagnosis not present

## 2021-09-27 DIAGNOSIS — J439 Emphysema, unspecified: Secondary | ICD-10-CM | POA: Diagnosis not present

## 2021-09-27 DIAGNOSIS — N179 Acute kidney failure, unspecified: Secondary | ICD-10-CM | POA: Diagnosis not present

## 2021-09-27 DIAGNOSIS — D649 Anemia, unspecified: Secondary | ICD-10-CM | POA: Diagnosis not present

## 2021-09-27 DIAGNOSIS — Z7951 Long term (current) use of inhaled steroids: Secondary | ICD-10-CM | POA: Diagnosis not present

## 2021-09-27 DIAGNOSIS — E7800 Pure hypercholesterolemia, unspecified: Secondary | ICD-10-CM | POA: Diagnosis not present

## 2021-09-27 DIAGNOSIS — Z4789 Encounter for other orthopedic aftercare: Secondary | ICD-10-CM | POA: Diagnosis not present

## 2021-09-27 DIAGNOSIS — J479 Bronchiectasis, uncomplicated: Secondary | ICD-10-CM | POA: Diagnosis not present

## 2021-09-29 DIAGNOSIS — Z4789 Encounter for other orthopedic aftercare: Secondary | ICD-10-CM | POA: Diagnosis not present

## 2021-09-29 DIAGNOSIS — I5041 Acute combined systolic (congestive) and diastolic (congestive) heart failure: Secondary | ICD-10-CM | POA: Diagnosis not present

## 2021-09-29 DIAGNOSIS — N179 Acute kidney failure, unspecified: Secondary | ICD-10-CM | POA: Diagnosis not present

## 2021-09-29 DIAGNOSIS — G603 Idiopathic progressive neuropathy: Secondary | ICD-10-CM | POA: Diagnosis not present

## 2021-09-29 DIAGNOSIS — B9620 Unspecified Escherichia coli [E. coli] as the cause of diseases classified elsewhere: Secondary | ICD-10-CM | POA: Diagnosis not present

## 2021-09-29 DIAGNOSIS — R7881 Bacteremia: Secondary | ICD-10-CM | POA: Diagnosis not present

## 2021-10-01 DIAGNOSIS — N179 Acute kidney failure, unspecified: Secondary | ICD-10-CM | POA: Diagnosis not present

## 2021-10-01 DIAGNOSIS — R7881 Bacteremia: Secondary | ICD-10-CM | POA: Diagnosis not present

## 2021-10-01 DIAGNOSIS — G603 Idiopathic progressive neuropathy: Secondary | ICD-10-CM | POA: Diagnosis not present

## 2021-10-01 DIAGNOSIS — B9620 Unspecified Escherichia coli [E. coli] as the cause of diseases classified elsewhere: Secondary | ICD-10-CM | POA: Diagnosis not present

## 2021-10-01 DIAGNOSIS — I5041 Acute combined systolic (congestive) and diastolic (congestive) heart failure: Secondary | ICD-10-CM | POA: Diagnosis not present

## 2021-10-01 DIAGNOSIS — Z4789 Encounter for other orthopedic aftercare: Secondary | ICD-10-CM | POA: Diagnosis not present

## 2021-10-02 DIAGNOSIS — T8149XA Infection following a procedure, other surgical site, initial encounter: Secondary | ICD-10-CM | POA: Diagnosis not present

## 2021-10-02 DIAGNOSIS — R7881 Bacteremia: Secondary | ICD-10-CM | POA: Diagnosis not present

## 2021-10-02 DIAGNOSIS — Z4789 Encounter for other orthopedic aftercare: Secondary | ICD-10-CM | POA: Diagnosis not present

## 2021-10-02 DIAGNOSIS — G603 Idiopathic progressive neuropathy: Secondary | ICD-10-CM | POA: Diagnosis not present

## 2021-10-02 DIAGNOSIS — B9620 Unspecified Escherichia coli [E. coli] as the cause of diseases classified elsewhere: Secondary | ICD-10-CM | POA: Diagnosis not present

## 2021-10-02 DIAGNOSIS — I5041 Acute combined systolic (congestive) and diastolic (congestive) heart failure: Secondary | ICD-10-CM | POA: Diagnosis not present

## 2021-10-02 DIAGNOSIS — N179 Acute kidney failure, unspecified: Secondary | ICD-10-CM | POA: Diagnosis not present

## 2021-10-04 DIAGNOSIS — Z4789 Encounter for other orthopedic aftercare: Secondary | ICD-10-CM | POA: Diagnosis not present

## 2021-10-04 DIAGNOSIS — G603 Idiopathic progressive neuropathy: Secondary | ICD-10-CM | POA: Diagnosis not present

## 2021-10-04 DIAGNOSIS — R7881 Bacteremia: Secondary | ICD-10-CM | POA: Diagnosis not present

## 2021-10-04 DIAGNOSIS — I5041 Acute combined systolic (congestive) and diastolic (congestive) heart failure: Secondary | ICD-10-CM | POA: Diagnosis not present

## 2021-10-04 DIAGNOSIS — N179 Acute kidney failure, unspecified: Secondary | ICD-10-CM | POA: Diagnosis not present

## 2021-10-04 DIAGNOSIS — B9620 Unspecified Escherichia coli [E. coli] as the cause of diseases classified elsewhere: Secondary | ICD-10-CM | POA: Diagnosis not present

## 2021-10-05 ENCOUNTER — Encounter (HOSPITAL_COMMUNITY): Payer: Self-pay | Admitting: Neurological Surgery

## 2021-10-05 DIAGNOSIS — R7881 Bacteremia: Secondary | ICD-10-CM | POA: Diagnosis not present

## 2021-10-05 DIAGNOSIS — G603 Idiopathic progressive neuropathy: Secondary | ICD-10-CM | POA: Diagnosis not present

## 2021-10-05 DIAGNOSIS — B9620 Unspecified Escherichia coli [E. coli] as the cause of diseases classified elsewhere: Secondary | ICD-10-CM | POA: Diagnosis not present

## 2021-10-05 DIAGNOSIS — N179 Acute kidney failure, unspecified: Secondary | ICD-10-CM | POA: Diagnosis not present

## 2021-10-05 DIAGNOSIS — Z4789 Encounter for other orthopedic aftercare: Secondary | ICD-10-CM | POA: Diagnosis not present

## 2021-10-05 DIAGNOSIS — I5041 Acute combined systolic (congestive) and diastolic (congestive) heart failure: Secondary | ICD-10-CM | POA: Diagnosis not present

## 2021-10-08 ENCOUNTER — Telehealth: Payer: Self-pay

## 2021-10-08 ENCOUNTER — Ambulatory Visit (INDEPENDENT_AMBULATORY_CARE_PROVIDER_SITE_OTHER): Payer: Medicare Other | Admitting: Infectious Diseases

## 2021-10-08 ENCOUNTER — Other Ambulatory Visit: Payer: Self-pay

## 2021-10-08 ENCOUNTER — Encounter: Payer: Self-pay | Admitting: Infectious Diseases

## 2021-10-08 VITALS — BP 113/75 | HR 87 | Temp 98.0°F | Wt 212.0 lb

## 2021-10-08 DIAGNOSIS — T847XXA Infection and inflammatory reaction due to other internal orthopedic prosthetic devices, implants and grafts, initial encounter: Secondary | ICD-10-CM | POA: Insufficient documentation

## 2021-10-08 DIAGNOSIS — Z5181 Encounter for therapeutic drug level monitoring: Secondary | ICD-10-CM | POA: Diagnosis not present

## 2021-10-08 DIAGNOSIS — Z452 Encounter for adjustment and management of vascular access device: Secondary | ICD-10-CM | POA: Insufficient documentation

## 2021-10-08 DIAGNOSIS — S31000A Unspecified open wound of lower back and pelvis without penetration into retroperitoneum, initial encounter: Secondary | ICD-10-CM | POA: Insufficient documentation

## 2021-10-08 DIAGNOSIS — T847XXD Infection and inflammatory reaction due to other internal orthopedic prosthetic devices, implants and grafts, subsequent encounter: Secondary | ICD-10-CM | POA: Diagnosis not present

## 2021-10-08 NOTE — Progress Notes (Incomplete)
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 18 g 12   apixaban (ELIQUIS) 5 MG TABS tablet Take 1 tablet (5 mg total) by mouth 2 (two) times daily. 60 tablet 3   Ascorbic Acid (VITAMIN C) 1000 MG tablet Take 1,000 mg by mouth daily.     CALCIUM-VITAMIN D PO Take 1 tablet by mouth in the morning and at bedtime.     ceFAZolin (ANCEF) IVPB Inject 2 g into the vein every 8 (eight) hours. Indication:  E.coli lumbar wound infection First Dose: Yes Last Day of Therapy:  10/22/21 Labs - Once weekly:  CBC/D and BMP, Labs - Every other week:  ESR and CRP Method of administration: IV Push Method of administration may be changed at the discretion of home infusion pharmacist based upon assessment of the patient and/or caregiver's ability to self-administer the medication ordered. 115 Units 0   Cholecalciferol (VITAMIN D) 125 MCG (5000 UT)  CAPS Take 5,000 Units by mouth daily.     cholestyramine (QUESTRAN) 4 g packet Take 4 g by mouth daily.     Esomeprazole Magnesium (NEXIUM 24HR PO) Take 22.3 mg by mouth in the morning and at bedtime.     Fluticasone-Umeclidin-Vilant (TRELEGY ELLIPTA) 100-62.5-25 MCG/ACT AEPB INHALE 1 PUFF ONCE DAILY (Patient taking differently: Inhale 1 puff into the lungs daily.) 60 each 11   methocarbamol (ROBAXIN) 500 MG tablet Take 1 tablet (500 mg total) by mouth every 6 (six) hours as needed for muscle spasms. 30 tablet 2   Multiple Vitamin (MULTIVITAMIN WITH MINERALS) TABS tablet Take 1 tablet by mouth daily.     ondansetron (ZOFRAN) 4 MG tablet Take 1 tablet (4 mg total) by mouth every 6 (six) hours as needed for nausea or vomiting. 20 tablet 2   oxyCODONE-acetaminophen (PERCOCET/ROXICET) 5-325 MG tablet Take 1-2 tablets by mouth every 4 (four) hours as needed for moderate pain or severe pain. (Patient taking differently: Take 1-2 tablets by mouth every 4 (four) hours as needed for moderate pain.) 30 tablet 0   pregabalin (LYRICA) 100 MG capsule Take 100-200 mg by mouth See admin instructions. Take 100 mg by mouth in the morning and 200 mg by mouth in the evening     Propylene Glycol (SYSTANE BALANCE OP) Place 1 drop into both eyes in the morning and at bedtime.     zinc gluconate 50 MG tablet Take 50 mg  by mouth daily.     No current facility-administered medications on file prior to visit.   Subjective: Here for HFU for post surgical deep lumbar wound infection involving the hardware. She is accompanied by her husband. Getting IV cefazolin without any issues with PICC. Wound  vac has been removed. Continues to have serosanguinous drainage , needs 2 dressing changes with yellowish goopy drainage. Denies fevers, chills,vomiting, abdominal pain and diarrhea. Nauseousthis morning. Has seen Dr Ellene Route twice after being discharged from the hospital. He discussed about potential closing the wound but patient wanted  to hold off on closing the wound because of drainage . Has a Fu with Dr Ellene Route next week.   Review of Systems: ROS all systems reviewed with pertinent positives and negatives as listed above  Past Medical History:  Diagnosis Date   Arthritis    osteo   Asthma    Asthma    Basal cell carcinoma    face   COPD (chronic obstructive pulmonary disease) ([place])    COVID    2022   GERD (gastroesophageal reflux disease)    Heart palpitations    History of hiatal hernia 1991   History of iron deficiency anemia    History of migraine    while taking birth control pills   Hypercholesteremia    Neuropathy    left greater than right feet   Obesity    Obstructive sleep apnea 07/09/2012   CPAP    Peripheral neuropathy    Pneumonia    as a child   PONV (postoperative nausea and vomiting)    not since 1967   Pulmonary embolism ([place])    Shortness of breath    with exertion   Stroke ([place])    TIA (transient ischemic attack) 2014   TIA (transient ischemic attack) 2014   no residual - no follow up needed   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   Past Surgical History:  Procedure Laterality Date   APPLICATION OF ROBOTIC ASSISTANCE FOR SPINAL PROCEDURE N/A 08/07/2021   Procedure: APPLICATION OF ROBOTIC ASSISTANCE FOR SPINAL PROCEDURE;  Surgeon: Kristeen Miss, MD;  Location: [place];  Service: Neurosurgery;  Laterality: N/A;   BREAST BIOPSY Right    BREAST CYST EXCISION Left    CARDIAC CATHETERIZATION  07/09/2012   cataracts Bilateral 2013   this was second one   CHOLECYSTECTOMY N/A 02/18/2017   Procedure: LAPAROSCOPIC CHOLECYSTECTOMY WITH INTRAOPERATIVE CHOLANGIOGRAM;  Surgeon: Erroll Luna, MD;  Location: [facility];  Service: General;  Laterality: N/A;   COLONOSCOPY     DILATION AND CURETTAGE OF UTERUS     EYE SURGERY Bilateral 2014   cataract removal 2013 and 2014   FINGER SURGERY Right 1971   ring finger repair   KNEE SURGERY Right    arthroscopy   LEFT HEART CATHETERIZATION WITH  CORONARY ANGIOGRAM N/A 07/09/2012   Procedure: LEFT HEART CATHETERIZATION WITH CORONARY ANGIOGRAM;  Surgeon: Sinclair Grooms, MD;  [facility] C[facility];  Service: Cardiovascular;  Laterality: N/A;   LUMBAR FUSION     L3-S1   LUMBAR WOUND DEBRIDEMENT N/A 09/10/2021   Procedure: LUMBAR WOUND DEBRIDEMENT;  Surgeon: Kristeen Miss, MD;  Location: [place];  Service: Neurosurgery;  Laterality: N/A;   TONSILLECTOMY     TOTAL KNEE ARTHROPLASTY Right 12/06/2019   Procedure: TOTAL KNEE ARTHROPLASTY;  Surgeon: Gaynelle Arabian, MD;  Location: [facility];  Service: Orthopedics;  Laterality: Right;  65mn   TUBAL LIGATION      Social History   Tobacco  Use   Smoking status: Never   Smokeless tobacco: Never   Tobacco comments:    Had tried when younger  Vaping Use   Vaping Use: Never used  Substance Use Topics   Alcohol use: No   Drug use: No    Family History  Problem Relation Age of Onset   Diabetes Mellitus II Mother    CVA Father    Heart attack Father    Lung cancer Father    Tuberculosis Sister        bovine    Allergies  Allergen Reactions   Sulfa Antibiotics Other (See Comments) and Palpitations   Actifed [Triprolidine-Pse] Palpitations   Lipitor [Atorvastatin] Other (See Comments)    Aches and pains   Bupropion Hcl Itching and Rash    Health Maintenance  Topic Date Due   Hepatitis C Screening  Never done   TETANUS/TDAP  Never done   Zoster Vaccines- Shingrix (1 of 2) Never done   DEXA SCAN  Never done   INFLUENZA VACCINE  12/18/2021   Pneumonia Vaccine 68+ Years old  Completed   COVID-19 Vaccine  Completed   HPV VACCINES  Aged Out    Objective: BP 113/75   Pulse 87   Temp 98 F (36.7 C) (Oral)   Wt 212 lb (96.2 kg)   BMI 35.83 kg/m    Physical Exam Constitutional:      Appearance: Normal appearance.  HENT:     Head: Normocephalic and atraumatic.      Mouth: Mucous membranes are moist.  Eyes:    Conjunctiva/sclera: Conjunctivae normal.     Pupils:    Cardiovascular:     Rate and Rhythm: Normal rate and regular rhythm.     Heart sounds:   Pulmonary:     Effort: Pulmonary effort is normal.     Breath sounds: Normal breath sounds.   Abdominal:     General: Non distended     Palpations: soft.   Musculoskeletal:        General: Normal range of motion.   Back   Skin:    General: Skin is warm and dry.     Comments:  Neurological:     General: grossly non focal     Mental Status: awake, alert and oriented to person, place, and time.   Psychiatric:        Mood and Affect: Mood normal.   Lab Results Lab Results  Component Value Date   WBC 10.1 09/11/2021   HGB 9.1 (L) 09/11/2021   HCT 28.1 (L) 09/11/2021   MCV 94.6 09/11/2021   PLT 396 09/11/2021    Lab Results  Component Value Date   CREATININE 0.93 09/12/2021   BUN 8 09/12/2021   NA 138 09/12/2021   K 3.6 09/12/2021   CL 107 09/12/2021   CO2 25 09/12/2021    Lab Results  Component Value Date   ALT 28 08/25/2021   AST 17 08/25/2021   ALKPHOS 234 (H) 08/25/2021   BILITOT 1.1 08/25/2021    No results found for: CHOL, HDL, LDLCALC, LDLDIRECT, TRIG, CHOLHDL No results found for: LABRPR, RPRTITER No results found for: HIV1RNAQUANT, HIV1RNAVL, CD4TABS   Problem List Items Addressed This Visit       Other   Hardware complicating wound infection ([place]) - Primary   Open wound of lumbar region   Medication monitoring encounter   PICC (peripherally inserted central catheter) in place   Assessment/Plan # Post surgical Deep lumbar wound infection involving  hardware:  in the setting of surgical decompression and instrumented stabilization from L2 to sacrum  including revision of a previous surgery with pseudoarthrosis of L5-S1l on 3/21 - S/p debridement of lumbar wound 4/24 with OR cultures E coli. OR note with purulence in the sub cut space, also involving the hardware L5 and sacrum. Bone graft in that area was also involved ( removed), wound vac placed.  -  Continue cefazolin to complete 6 weeks. OPAT orders in place - Fu in 10/22/21, plan to switch to PO abtx at that time    # PICC  - no issues   # Medication Monitoring  10/02/21 cr 0.6, WBC 6.4, HB 9.2, Plts 277  I have personally spent 46 minutes involved in face-to-face and non-face-to-face activities for this patient on the day of the visit including counseling of the patient and coordination of care.   Wilber Oliphant, [place] [facility] 10/08/2021, 8:53 AM

## 2021-10-08 NOTE — Progress Notes (Signed)
Diagnosis: post surgical deep lumbar wound infection  Culture Result: E coli  Allergies  Allergen Reactions   Sulfa Antibiotics Other (See Comments) and Palpitations   Actifed [Triprolidine-Pse] Palpitations   Lipitor [Atorvastatin] Other (See Comments)    Aches and pains   Bupropion Hcl Itching and Rash    OPAT Orders Discharge antibiotics to be given via PICC line Discharge antibiotics: cefazolin Per pharmacy protocol  Aim for Vancomycin trough 15-20 or AUC 400-550 (unless otherwise indicated) Duration: 6 weeks  End Date: 10/22/21  [facility] Care Per Protocol:  Home health RN for IV administration and teaching; PICC line care and labs.    Labs weekly while on IV antibiotics: X__ CBC with differential X__ BMP __ CMP X__ CRP and ESR every 2 weeks  __ Vancomycin trough __ CK  X_ Please pull PIC at completion of IV antibiotics __ Please leave PIC in place until doctor has seen patient or been notified  Fax weekly labs to [phone]  Clinic Follow Up Appt: 10/22/21 Dr West Bali

## 2021-10-08 NOTE — Telephone Encounter (Signed)
Tiffany, pharmacist at [facility], called to confirm PICC orders for this patient. Dr. West Bali wrote the following orders today, which were faxed to [facility] at [phone]. Fax receipt confirmed and Tiffany read back orders.  "Cefazolin until 10/22/21 Remove PICC after last dose on 10/22/21 CBC, BMP weekly ESR & CRP once in 2 weeks"  Routed to [facility] clinical pharmacists.  Binnie Kand, RN

## 2021-10-09 DIAGNOSIS — Z4789 Encounter for other orthopedic aftercare: Secondary | ICD-10-CM | POA: Diagnosis not present

## 2021-10-09 DIAGNOSIS — T8149XA Infection following a procedure, other surgical site, initial encounter: Secondary | ICD-10-CM | POA: Diagnosis not present

## 2021-10-09 DIAGNOSIS — B9620 Unspecified Escherichia coli [E. coli] as the cause of diseases classified elsewhere: Secondary | ICD-10-CM | POA: Diagnosis not present

## 2021-10-09 DIAGNOSIS — R7881 Bacteremia: Secondary | ICD-10-CM | POA: Diagnosis not present

## 2021-10-09 DIAGNOSIS — N179 Acute kidney failure, unspecified: Secondary | ICD-10-CM | POA: Diagnosis not present

## 2021-10-09 DIAGNOSIS — G603 Idiopathic progressive neuropathy: Secondary | ICD-10-CM | POA: Diagnosis not present

## 2021-10-09 DIAGNOSIS — I5041 Acute combined systolic (congestive) and diastolic (congestive) heart failure: Secondary | ICD-10-CM | POA: Diagnosis not present

## 2021-10-09 NOTE — Telephone Encounter (Signed)
Thank you Erin.

## 2021-10-12 DIAGNOSIS — I5041 Acute combined systolic (congestive) and diastolic (congestive) heart failure: Secondary | ICD-10-CM | POA: Diagnosis not present

## 2021-10-12 DIAGNOSIS — G603 Idiopathic progressive neuropathy: Secondary | ICD-10-CM | POA: Diagnosis not present

## 2021-10-12 DIAGNOSIS — N179 Acute kidney failure, unspecified: Secondary | ICD-10-CM | POA: Diagnosis not present

## 2021-10-12 DIAGNOSIS — R7881 Bacteremia: Secondary | ICD-10-CM | POA: Diagnosis not present

## 2021-10-12 DIAGNOSIS — B9620 Unspecified Escherichia coli [E. coli] as the cause of diseases classified elsewhere: Secondary | ICD-10-CM | POA: Diagnosis not present

## 2021-10-12 DIAGNOSIS — Z4789 Encounter for other orthopedic aftercare: Secondary | ICD-10-CM | POA: Diagnosis not present

## 2021-10-15 NOTE — Progress Notes (Unsigned)
Cardiology Office Note:    Date:  10/16/2021   ID:  Christy Stephenson, DOB 1940/06/28, MRN [id number]  PCP:  Christy Carol, MD   [facility] Providers Cardiologist:  Christy Burow, MD Cardiology APP:  Christy Bottcher, PA { Referring MD: Christy Carol, MD   Chief Complaint  Patient presents with   Hospitalization Follow-up    HFrEF    History of Present Illness:    Christy Stephenson is a 81 y.o. female with a hx of TIA 2014, asthma, OSA on CPAP, idiopathic progressive polyneuropathy, memory loss, and morbid obesity. Hx of false positive nuclear stress test in 2014. Heart cath with normal coronaries but occluded innominate artery.   She underwent lumbar decompression surgery 08/07/21 and discharged the following day. Unfortunately, she returned to the ER 08/21/21 with weakness and SOB found to have a PE and AKI - also E coli bacteremia felt related to UTI.  She was treated with eliquis. Mild wound dehiscence was observed and treated with wound care. Echocardiogram with LVEF 30-35%, grade 1 DD and global hypokinesis with mild LVH and mildly reduced RV function. Moderately elevated PASP, mild to moderate MR and mild to moderate TR. Cardiology was consulted. She has chronic dyspnea at baseline exacerbate by COVID 03/2021. GDMT included toprol. Renal function precluded use of ACEI/ARB/ARNI. Gentle diuresis and plan for spiro prior to discharge. CE peaked at 3397, planned for CT coronary outpatient and repeat echo in 3 months.   In follow up with the surgeon, there was increase drainage from wound and she was started on PO ABX without improvement. Hospital admission was recommended for surgical debridement and exploration for deep space infection. She was afebrile. Admitted 09/10/21 for surgical debridement found to have extensive infection requiring wound vac. Wound cultures and ID consulted for 6 weeks of IV ABX.   She presents today for follow-up. She is still having draining from her wound.  She did not tolerate toprol even at a 12.5 mg dose due to significant hypotension with BP 60/40.   Past Medical History:  Diagnosis Date   Arthritis    osteo   Asthma    Asthma    Basal cell carcinoma    face   COPD (chronic obstructive pulmonary disease) ([place])    COVID    2022   GERD (gastroesophageal reflux disease)    Heart palpitations    History of hiatal hernia 1991   History of iron deficiency anemia    History of migraine    while taking birth control pills   Hypercholesteremia    Neuropathy    left greater than right feet   Obesity    Obstructive sleep apnea 07/09/2012   CPAP    Peripheral neuropathy    Pneumonia    as a child   PONV (postoperative nausea and vomiting)    not since 1967   Pulmonary embolism (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;  Service: General;  Laterality: N/A;   COLONOSCOPY     DILATION AND CURETTAGE OF UTERUS     EYE SURGERY Bilateral 2014   cataract removal 2013 and 2014   FINGER SURGERY Right 1971   ring finger repair   KNEE SURGERY Right    arthroscopy   LEFT HEART CATHETERIZATION WITH CORONARY ANGIOGRAM N/A 07/09/2012   Procedure: LEFT HEART CATHETERIZATION WITH CORONARY ANGIOGRAM;   Surgeon: Sinclair Grooms, MD;  Location: [facility] CATH LAB;  Service: Cardiovascular;  Laterality: N/A;   LUMBAR FUSION     L3-S1   LUMBAR WOUND DEBRIDEMENT N/A 09/10/2021   Procedure: LUMBAR WOUND DEBRIDEMENT;  Surgeon: Kristeen Miss, MD;  Location: [place];  Service: Neurosurgery;  Laterality: N/A;   TONSILLECTOMY     TOTAL KNEE ARTHROPLASTY Right 12/06/2019   Procedure: TOTAL KNEE ARTHROPLASTY;  Surgeon: Gaynelle Arabian, MD;  Location: [facility];  Service: Orthopedics;  Laterality: Right;  36min   TUBAL LIGATION      Current Medications: Current Meds  Medication Sig   albuterol (VENTOLIN HFA) 108 (90 Base) MCG/ACT inhaler Inhale 2 puffs into the lungs every 6 (six) hours as needed for wheezing or shortness of breath.   apixaban (ELIQUIS) 5 MG TABS tablet Take 1 tablet (5 mg total) by mouth 2 (two) times daily.   Ascorbic Acid (VITAMIN C) 1000 MG tablet Take 1,000 mg by mouth daily.   CALCIUM-VITAMIN D PO Take 1 tablet by mouth in the morning and at bedtime.   ceFAZolin (ANCEF) IVPB Inject 2 g into the vein every 8 (eight) hours. Indication:  E.coli lumbar wound infection First Dose: Yes Last Day of Therapy:  10/22/21 Labs - Once weekly:  CBC/D and BMP, Labs - Every other week:  ESR and CRP Method of administration: IV Push Method of administration may be changed at the discretion of home infusion pharmacist based upon assessment of the patient and/or caregiver's ability to self-administer the medication ordered.   Cholecalciferol (VITAMIN D) 125 MCG (5000 UT) CAPS Take 5,000 Units by mouth daily.   cholestyramine (QUESTRAN) 4 g packet Take 4 g by mouth daily.   Esomeprazole Magnesium (NEXIUM 24HR PO) Take 22.3 mg by mouth in the morning and at bedtime.   Fluticasone-Umeclidin-Vilant (TRELEGY ELLIPTA) 100-62.5-25 MCG/ACT AEPB INHALE 1 PUFF ONCE DAILY (Patient taking differently: Inhale 1 puff into the lungs daily.)   furosemide (LASIX) 20 MG tablet Take 1 tablet (20 mg total) by mouth as needed.  For weight gain 3 pounds in a day and 5 pounds in a week.   methocarbamol (ROBAXIN) 500 MG tablet Take 1 tablet (500 mg total) by mouth every 6 (six) hours as needed for muscle spasms.   Multiple Vitamin (MULTIVITAMIN WITH MINERALS) TABS tablet Take 1 tablet by mouth daily.   ondansetron (ZOFRAN) 4 MG tablet Take 1 tablet (4 mg total) by mouth every 6 (six) hours as needed for nausea or vomiting.   oxyCODONE-acetaminophen (PERCOCET/ROXICET) 5-325 MG tablet Take 1-2 tablets by mouth every 4 (four) hours as needed for moderate pain or severe pain. (Patient taking differently: Take 1-2 tablets by mouth every 4 (four) hours as needed for moderate pain.)   pregabalin (LYRICA) 100 MG capsule Take 100-200 mg by mouth See admin instructions. Take 100 mg by  mouth in the morning and 200 mg by mouth in the evening   Propylene Glycol (SYSTANE BALANCE OP) Place 1 drop into both eyes in the morning and at bedtime.   zinc gluconate 50 MG tablet Take 50 mg by mouth daily.     Allergies:   Sulfa antibiotics, Actifed [triprolidine-pse], Lipitor [atorvastatin], and Bupropion hcl   Social History   Socioeconomic History   Marital status: Married    Spouse name: Christy Stephenson   Number of children: 2   Years of education: AD   Highest education level: Not on file  Occupational History   Occupation: retired    Fish farm manager: [employer]    Comment: AD Nursing  Tobacco Use   Smoking status: Never   Smokeless tobacco: Never   Tobacco comments:    Had tried when younger  Vaping Use   Vaping Use: Never used  Substance and Sexual Activity   Alcohol use: No   Drug use: No   Sexual activity: Yes    Birth control/protection: Post-menopausal  Other Topics Concern   Not on file  Social History Narrative   Not on file   Social Determinants of Health   Financial Resource Strain: Not on file  Food Insecurity: Not on file  Transportation Needs: Not on file  Physical Activity: Not on file  Stress: Not on file   Social Connections: Not on file     Family History: The patient's family history includes CVA in her father; Diabetes Mellitus II in her mother; Heart attack in her father; Lung cancer in her father; Tuberculosis in her sister.  ROS:   Please see the history of present illness.     All other systems reviewed and are negative.  EKGs/Labs/Other Studies Reviewed:    The following studies were reviewed today:  Echo 08/21/21:  1. Left ventricular ejection fraction, by estimation, is 30 to 35%. Left  ventricular ejection fraction by 3D volume is 35 %. The left ventricle has  moderately decreased function. The left ventricle demonstrates global  hypokinesis. There is mild left  ventricular hypertrophy. Left ventricular diastolic parameters are  consistent with Grade I diastolic dysfunction (impaired relaxation).   2. Right ventricular systolic function is mildly reduced. The right  ventricular size is mildly enlarged. There is moderately elevated  pulmonary artery systolic pressure.   3. The mitral valve is normal in structure. Mild to moderate mitral valve  regurgitation. No evidence of mitral stenosis.   4. Tricuspid valve regurgitation is mild to moderate.   5. The aortic valve is normal in structure. Aortic valve regurgitation is  not visualized. No aortic stenosis is present.   EKG:  EKG is  ordered today.  The ekg ordered today demonstrates NSR HR 81 poor R wave progression  Recent Labs: 05/07/2021: Pro B Natriuretic peptide (BNP) 60.0 08/21/2021: TSH 0.298 08/23/2021: Magnesium 2.5 08/25/2021: ALT 28; B Natriuretic Peptide 334.4 09/11/2021: Hemoglobin 9.1; Platelets 396 09/12/2021: BUN 8; Creatinine, Ser 0.93; Potassium 3.6; Sodium 138  Recent Lipid Panel No results found for: CHOL, TRIG, HDL, CHOLHDL, VLDL, LDLCALC, LDLDIRECT   Risk Assessment/Calculations:           Physical Exam:    VS:  BP 114/76 (BP Location: Left Arm, Patient Position: Sitting, Cuff Size: Large)    Pulse 81   Ht $R'5\' 4"'Ke$  (1.626 m)   Wt 206 lb 12.8 oz (93.8 kg)   SpO2 95%   BMI 35.50 kg/m     Wt Readings from Last 3 Encounters:  10/16/21 206 lb 12.8 oz (93.8 kg)  10/08/21 212 lb (96.2 kg)  09/10/21 205 lb (93 kg)     GEN:  Well nourished, well developed in no acute distress HEENT: Normal NECK: No JVD; No carotid bruits LYMPHATICS: No lymphadenopathy CARDIAC: RRR, no murmurs, rubs, gallops RESPIRATORY:  Clear to auscultation without rales, wheezing or rhonchi  ABDOMEN: Soft, non-tender, non-distended MUSCULOSKELETAL:  No edema; No deformity  SKIN: Warm and dry NEUROLOGIC:  Alert and oriented x 3 PSYCHIATRIC:  Normal affect   ASSESSMENT:    1. Chronic combined systolic and diastolic heart failure ([place])   2. Mitral valve insufficiency, unspecified etiology   3. Tricuspid valve insufficiency, unspecified etiology   4. Hyperlipidemia, unspecified hyperlipidemia type   5. Other acute pulmonary embolism, unspecified whether acute cor pulmonale present ([place])   6. TIA (transient ischemic attack)   7. Chronic anticoagulation    PLAN:    In order of problems listed above:  Chronic systolic and diastolic heart failure Mild to moderate MR, mild to moderate TR NSTEMI LVEF 30-35% GDMT:  - did not tolerate toprol - consider CT coronary OP when she has cleared her infection - normal cors by heart cath in 2014 - no chest pain I think it is reasonable to defer CT coronary while she is on IV ABX with a draining wound. She is fairly sedentary, I did encourage walking if cleared by neurosurgery.  Follow up in 3 months to evaluate ongoing back infection. May consider repeating echo +/- CT coronary.   AKI  Anemia - both resolved   Hyperlipidemia LDL 139 on 02/2021 She states this is familial and does not wish to take any medications for this at this time.    PE - per VQ scan - continue eliquis 5 mg BID - nose bleed, but no significant bleeding - Hb recovering   Hx of  TIA in 2014 - no antiplatelet in the setting of eliquis   Follow up with Dr. Gwenlyn Found in 3 months.       Medication Adjustments/Labs and Tests Ordered: Current medicines are reviewed at length with the patient today.  Concerns regarding medicines are outlined above.  Orders Placed This Encounter  Procedures   EKG 12-Lead   Meds ordered this encounter  Medications   furosemide (LASIX) 20 MG tablet    Sig: Take 1 tablet (20 mg total) by mouth as needed. For weight gain 3 pounds in a day and 5 pounds in a week.    Dispense:  30 tablet    Refill:  3    Patient Instructions  Medication Instructions:  Start lasix 20 mg ( Take 1 Tablet Daily As Needed For weight gain 3 pounds in a day and 5 pounds in a week). *If you need a refill on your cardiac medications before your next appointment, please call your pharmacy*   Lab Work: No labs If you have labs (blood work) drawn today and your tests are completely normal, you will receive your results only by: [place] (if you have MyChart) OR A paper copy in the mail If you have any lab test that is abnormal or we need to change your treatment, we will call you to review the results.   Testing/Procedures: No Testing   Follow-Up: At [facility], you and your health needs are our priority.  As part of our continuing mission to provide you with exceptional heart care, we have created designated Provider Care Teams.  These Care Teams include your  primary Cardiologist (physician) and Advanced Practice Providers (APPs -  Physician Assistants and Nurse Practitioners) who all work together to provide you with the care you need, when you need it.  We recommend signing up for the patient portal called "MyChart".  Sign up information is provided on this After Visit Summary.  MyChart is used to connect with patients for Virtual Visits (Telemedicine).  Patients are able to view lab/test results, encounter notes, upcoming appointments, etc.   Non-urgent messages can be sent to your provider as well.   To learn more about what you can do with MyChart, go to [email or web address].    Your next appointment:   3 month(s)  The format for your next appointment:   In Person  Provider:   Quay Burow, MD       Important Information About Sugar         Signed, Christy Stephenson, [place]  10/16/2021 4:23 PM    [place]

## 2021-10-16 ENCOUNTER — Encounter: Payer: Self-pay | Admitting: Physician Assistant

## 2021-10-16 ENCOUNTER — Ambulatory Visit (INDEPENDENT_AMBULATORY_CARE_PROVIDER_SITE_OTHER): Payer: Medicare Other | Admitting: Physician Assistant

## 2021-10-16 ENCOUNTER — Encounter: Payer: Self-pay | Admitting: Infectious Diseases

## 2021-10-16 VITALS — BP 114/76 | HR 81 | Ht 64.0 in | Wt 206.8 lb

## 2021-10-16 DIAGNOSIS — Z7901 Long term (current) use of anticoagulants: Secondary | ICD-10-CM | POA: Diagnosis not present

## 2021-10-16 DIAGNOSIS — G459 Transient cerebral ischemic attack, unspecified: Secondary | ICD-10-CM | POA: Diagnosis not present

## 2021-10-16 DIAGNOSIS — I340 Nonrheumatic mitral (valve) insufficiency: Secondary | ICD-10-CM | POA: Diagnosis not present

## 2021-10-16 DIAGNOSIS — B9620 Unspecified Escherichia coli [E. coli] as the cause of diseases classified elsewhere: Secondary | ICD-10-CM | POA: Diagnosis not present

## 2021-10-16 DIAGNOSIS — I2699 Other pulmonary embolism without acute cor pulmonale: Secondary | ICD-10-CM | POA: Diagnosis not present

## 2021-10-16 DIAGNOSIS — I5041 Acute combined systolic (congestive) and diastolic (congestive) heart failure: Secondary | ICD-10-CM | POA: Diagnosis not present

## 2021-10-16 DIAGNOSIS — G603 Idiopathic progressive neuropathy: Secondary | ICD-10-CM | POA: Diagnosis not present

## 2021-10-16 DIAGNOSIS — Z4789 Encounter for other orthopedic aftercare: Secondary | ICD-10-CM | POA: Diagnosis not present

## 2021-10-16 DIAGNOSIS — N179 Acute kidney failure, unspecified: Secondary | ICD-10-CM | POA: Diagnosis not present

## 2021-10-16 DIAGNOSIS — E785 Hyperlipidemia, unspecified: Secondary | ICD-10-CM | POA: Diagnosis not present

## 2021-10-16 DIAGNOSIS — I071 Rheumatic tricuspid insufficiency: Secondary | ICD-10-CM

## 2021-10-16 DIAGNOSIS — T8149XA Infection following a procedure, other surgical site, initial encounter: Secondary | ICD-10-CM | POA: Diagnosis not present

## 2021-10-16 DIAGNOSIS — R7881 Bacteremia: Secondary | ICD-10-CM | POA: Diagnosis not present

## 2021-10-16 DIAGNOSIS — I5042 Chronic combined systolic (congestive) and diastolic (congestive) heart failure: Secondary | ICD-10-CM | POA: Diagnosis not present

## 2021-10-16 MED ORDER — FUROSEMIDE 20 MG PO TABS: 20.0000 mg | ORAL_TABLET | ORAL | 3 refills | Status: DC | PRN

## 2021-10-16 NOTE — Progress Notes (Signed)
10/09/21 WBC 4.9, Hb 9.5, plts 255, cr 0.6

## 2021-10-16 NOTE — Patient Instructions (Signed)
Medication Instructions:  Start lasix 20 mg ( Take 1 Tablet Daily As Needed For weight gain 3 pounds in a day and 5 pounds in a week). *If you need a refill on your cardiac medications before your next appointment, please call your pharmacy*   Lab Work: No labs If you have labs (blood work) drawn today and your tests are completely normal, you will receive your results only by: [place] (if you have MyChart) OR A paper copy in the mail If you have any lab test that is abnormal or we need to change your treatment, we will call you to review the results.   Testing/Procedures: No Testing   Follow-Up: At [facility], you and your health needs are our priority.  As part of our continuing mission to provide you with exceptional heart care, we have created designated Provider Care Teams.  These Care Teams include your primary Cardiologist (physician) and Advanced Practice Providers (APPs -  Physician Assistants and Nurse Practitioners) who all work together to provide you with the care you need, when you need it.  We recommend signing up for the patient portal called "MyChart".  Sign up information is provided on this After Visit Summary.  MyChart is used to connect with patients for Virtual Visits (Telemedicine).  Patients are able to view lab/test results, encounter notes, upcoming appointments, etc.  Non-urgent messages can be sent to your provider as well.   To learn more about what you can do with MyChart, go to [email or web address].    Your next appointment:   3 month(s)  The format for your next appointment:   In Person  Provider:   Quay Burow, MD       Important Information About Sugar

## 2021-10-18 DIAGNOSIS — Z4789 Encounter for other orthopedic aftercare: Secondary | ICD-10-CM | POA: Diagnosis not present

## 2021-10-18 DIAGNOSIS — R7881 Bacteremia: Secondary | ICD-10-CM | POA: Diagnosis not present

## 2021-10-18 DIAGNOSIS — G603 Idiopathic progressive neuropathy: Secondary | ICD-10-CM | POA: Diagnosis not present

## 2021-10-18 DIAGNOSIS — B9620 Unspecified Escherichia coli [E. coli] as the cause of diseases classified elsewhere: Secondary | ICD-10-CM | POA: Diagnosis not present

## 2021-10-18 DIAGNOSIS — N179 Acute kidney failure, unspecified: Secondary | ICD-10-CM | POA: Diagnosis not present

## 2021-10-18 DIAGNOSIS — I5041 Acute combined systolic (congestive) and diastolic (congestive) heart failure: Secondary | ICD-10-CM | POA: Diagnosis not present

## 2021-10-19 DIAGNOSIS — Z4789 Encounter for other orthopedic aftercare: Secondary | ICD-10-CM | POA: Diagnosis not present

## 2021-10-19 DIAGNOSIS — B9620 Unspecified Escherichia coli [E. coli] as the cause of diseases classified elsewhere: Secondary | ICD-10-CM | POA: Diagnosis not present

## 2021-10-19 DIAGNOSIS — R7881 Bacteremia: Secondary | ICD-10-CM | POA: Diagnosis not present

## 2021-10-19 DIAGNOSIS — I5041 Acute combined systolic (congestive) and diastolic (congestive) heart failure: Secondary | ICD-10-CM | POA: Diagnosis not present

## 2021-10-19 DIAGNOSIS — N179 Acute kidney failure, unspecified: Secondary | ICD-10-CM | POA: Diagnosis not present

## 2021-10-19 DIAGNOSIS — G603 Idiopathic progressive neuropathy: Secondary | ICD-10-CM | POA: Diagnosis not present

## 2021-10-22 ENCOUNTER — Encounter: Payer: Self-pay | Admitting: Infectious Diseases

## 2021-10-22 ENCOUNTER — Ambulatory Visit (INDEPENDENT_AMBULATORY_CARE_PROVIDER_SITE_OTHER): Payer: Medicare Other | Admitting: Infectious Diseases

## 2021-10-22 ENCOUNTER — Other Ambulatory Visit: Payer: Self-pay

## 2021-10-22 VITALS — BP 104/72 | HR 86 | Temp 98.0°F | Resp 17 | Ht 62.0 in | Wt 205.0 lb

## 2021-10-22 DIAGNOSIS — Z452 Encounter for adjustment and management of vascular access device: Secondary | ICD-10-CM | POA: Diagnosis not present

## 2021-10-22 DIAGNOSIS — T847XXD Infection and inflammatory reaction due to other internal orthopedic prosthetic devices, implants and grafts, subsequent encounter: Secondary | ICD-10-CM

## 2021-10-22 DIAGNOSIS — Z5181 Encounter for therapeutic drug level monitoring: Secondary | ICD-10-CM

## 2021-10-22 MED ORDER — CEFADROXIL 500 MG PO CAPS: 1000.0000 mg | ORAL_CAPSULE | Freq: Two times a day (BID) | ORAL | 5 refills | Status: DC

## 2021-10-22 MED ORDER — CEFADROXIL 500 MG PO CAPS: 500.0000 mg | ORAL_CAPSULE | Freq: Two times a day (BID) | ORAL | 5 refills | Status: DC

## 2021-10-22 NOTE — Addendum Note (Signed)
Addended by: Rosiland Oz on: 10/22/2021 03:06 PM   Modules accepted: Orders

## 2021-10-22 NOTE — Progress Notes (Signed)
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   OA (osteoarthritis) of knee 12/06/2019   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/2019   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 18 g 12   apixaban (ELIQUIS) 5 MG TABS tablet Take 1 tablet (5 mg total) by mouth 2 (two) times daily. 60 tablet 3   Ascorbic Acid (VITAMIN C) 1000 MG tablet Take 1,000 mg by mouth daily.     calcium carbonate (OS-CAL) 1250 (500 Ca) MG chewable tablet Chew by mouth as needed. (Patient not taking: Reported on 10/16/2021)     CALCIUM-VITAMIN D PO Take 1 tablet by mouth in the morning and at bedtime.     ceFAZolin (ANCEF) IVPB Inject 2 g into the vein every 8 (eight) hours. Indication:  E.coli lumbar wound infection First Dose: Yes Last Day of Therapy:  10/22/21 Labs - Once  weekly:  CBC/D and BMP, Labs - Every other week:  ESR and CRP Method of administration: IV Push Method of administration may be changed at the discretion of home infusion pharmacist based upon assessment of the patient and/or caregiver's ability to self-administer the medication ordered. 115 Units 0   Cholecalciferol (VITAMIN D) 125 MCG (5000 UT) CAPS Take 5,000 Units by mouth daily.     cholestyramine (QUESTRAN) 4 g packet Take 4 g by mouth daily.     Esomeprazole Magnesium (NEXIUM 24HR PO) Take 22.3 mg by mouth in the morning and at bedtime.     Fluticasone-Umeclidin-Vilant (TRELEGY ELLIPTA) 100-62.5-25 MCG/ACT AEPB INHALE 1 PUFF ONCE DAILY (Patient taking differently: Inhale 1 puff into the lungs daily.) 60 each 11   furosemide (LASIX) 20 MG tablet Take 1 tablet (20 mg total) by mouth as needed. For weight gain 3 pounds in a day and 5 pounds in a week. 30 tablet 3   methocarbamol (ROBAXIN) 500 MG tablet Take 1 tablet (500 mg total) by mouth every 6 (six) hours as needed for muscle spasms. 30 tablet 2   Multiple Vitamin (MULTIVITAMIN WITH MINERALS) TABS tablet Take 1 tablet by mouth daily.     ondansetron (ZOFRAN) 4 MG tablet Take 1 tablet (4 mg total) by mouth every 6 (six) hours as needed for nausea or vomiting. 20 tablet 2   oxyCODONE-acetaminophen (PERCOCET/ROXICET) 5-325 MG tablet Take 1-2 tablets by mouth every 4 (four) hours as needed  for moderate pain or severe pain. (Patient taking differently: Take 1-2 tablets by mouth every 4 (four) hours as needed for moderate pain.) 30 tablet 0   pregabalin (LYRICA) 100 MG capsule Take 100-200 mg by mouth See admin instructions. Take 100 mg by mouth in the morning and 200 mg by mouth in the evening     Propylene Glycol (SYSTANE BALANCE OP) Place 1 drop into both eyes in the morning and at bedtime.     zinc gluconate 50 MG tablet Take 50 mg by mouth daily.     No current facility-administered medications on file prior to visit.   Subjective: Here  for HFU for post surgical deep lumbar wound infection involving the hardware. She is accompanied by her husband. Getting IV cefazolin without any issues with PICC. Denies any nausea, diarrhea but had 1 episode of NBNB vomiting which resolved. She missed her last appt with Dr Ellene Route and appt has been rescheduled to June 21. She thinks wound in her back is getting somewhat smaller and drainage is also smaller. No foul smell. Denies fevers and chills. Today is last day of IV cefazolin. Discussed plan to transition to cefadroxil $RemoveBefor'1000mg'[id number]$  po bid from tomorrow. No concerns otherwise.   Review of Systems: ROS all systems reviewed with pertinent positives and negatives as listed above  Past Medical History:  Diagnosis Date   Arthritis    osteo   Asthma    Asthma    Basal cell carcinoma    face   COPD (chronic obstructive pulmonary disease) ([place])    COVID    2022   GERD (gastroesophageal reflux disease)    Heart palpitations    History of hiatal hernia 1991   History of iron deficiency anemia    History of migraine    while taking birth control pills   Hypercholesteremia    Neuropathy    left greater than right feet   Obesity    Obstructive sleep apnea 07/09/2012   CPAP    Peripheral neuropathy    Pneumonia    as a child   PONV (postoperative nausea and vomiting)    not since 1967   Pulmonary embolism ([place])    Shortness of breath    with exertion   Stroke ([place])    TIA (transient ischemic attack) 2014   TIA (transient ischemic attack) 2014   no residual - no follow up needed   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   Past Surgical History:  Procedure Laterality Date   APPLICATION OF ROBOTIC ASSISTANCE FOR SPINAL PROCEDURE N/A 08/07/2021   Procedure: APPLICATION OF ROBOTIC ASSISTANCE FOR SPINAL PROCEDURE;  Surgeon: Kristeen Miss, MD;  Location: [facility];  Service: Neurosurgery;  Laterality: N/A;   BREAST BIOPSY Right    BREAST CYST EXCISION Left    CARDIAC CATHETERIZATION  07/09/2012    cataracts Bilateral 2013   this was second one   CHOLECYSTECTOMY N/A 02/18/2017   Procedure: LAPAROSCOPIC CHOLECYSTECTOMY WITH INTRAOPERATIVE CHOLANGIOGRAM;  Surgeon: Erroll Luna, MD;  Location: [facility];  Service: General;  Laterality: N/A;   COLONOSCOPY     DILATION AND CURETTAGE OF UTERUS     EYE SURGERY Bilateral 2014   cataract removal 2013 and 2014   FINGER SURGERY Right 1971   ring finger repair   KNEE SURGERY Right    arthroscopy   LEFT HEART CATHETERIZATION WITH CORONARY ANGIOGRAM N/A 07/09/2012   Procedure: LEFT HEART CATHETERIZATION WITH CORONARY ANGIOGRAM;  Surgeon: Sinclair Grooms, MD;  Location: [facility] CATH  LAB;  Service: Cardiovascular;  Laterality: N/A;   LUMBAR FUSION     L3-S1   LUMBAR WOUND DEBRIDEMENT N/A 09/10/2021   Procedure: LUMBAR WOUND DEBRIDEMENT;  Surgeon: Barnett Abu, MD;  [facility];  Service: Neurosurgery;  Laterality: N/A;   TONSILLECTOMY     TOTAL KNEE ARTHROPLASTY Right 12/06/2019   Procedure: TOTAL KNEE ARTHROPLASTY;  Surgeon: Ollen Gross, MD;  Location: [facility];  Service: Orthopedics;  Laterality: Right;    TUBAL LIGATION      Social History   Tobacco Use   Smoking status: Never   Smokeless tobacco: Never   Tobacco comments:    Had tried when younger  Vaping Use   Vaping Use: Never used  Substance Use Topics   Alcohol use: No   Drug use: No    Family History  Problem Relation Age of Onset   Diabetes Mellitus II Mother    CVA Father    Heart attack Father    Lung cancer Father    Tuberculosis Sister        bovine    Allergies  Allergen Reactions   Sulfa Antibiotics Other (See Comments) and Palpitations   Actifed [Triprolidine-Pse] Palpitations   Lipitor [Atorvastatin] Other (See Comments)    Aches and pains   Bupropion Hcl Itching and Rash    Health Maintenance  Topic Date Due   Hepatitis C Screening  Never done   TETANUS/TDAP  Never done   Zoster Vaccines- Shingrix (1 of 2) Never done   DEXA SCAN  Never  done   INFLUENZA VACCINE  12/18/2021   Pneumonia Vaccine 7+ Years old  Completed   COVID-19 Vaccine  Completed   HPV VACCINES  Aged Out    Objective: BP 104/72   Pulse 86   Temp 98 F (36.7 C)   Resp 17   Ht 5\' 2"  (1.575 m)   Wt 205 lb (93 kg)   BMI 37.49 kg/m   Physical Exam Constitutional:      Appearance: Normal appearance. Obese  HENT:     Head: Normocephalic and atraumatic.      Mouth: Mucous membranes are moist.  Eyes:    Conjunctiva/sclera: Conjunctivae normal.     Pupils:   Cardiovascular:     Rate and Rhythm: Normal rate and regular rhythm.     Heart sounds:   Pulmonary:     Effort: Pulmonary effort is normal.     Breath sounds: Normal breath sounds.   Abdominal:     General: Non distended     Palpations: soft.   Musculoskeletal:        General: Normal range of motion.   Back No significant changes of open lumbar wound from last exam. Bandage is C/D/I No significant drainage except yellowish slimy material inside the wound No surrounding erythema and warmth/tenderness  Skin:    General: Skin is warm and dry.     Comments:  Neurological:     General: grossly non focal     Mental Status: awake, alert and oriented to person, place, and time.   Psychiatric:        Mood and Affect: Mood normal.   Lab Results Lab Results  Component Value Date   WBC 10.1 09/11/2021   HGB 9.1 (L) 09/11/2021   HCT 28.1 (L) 09/11/2021   MCV 94.6 09/11/2021   PLT 396 09/11/2021    Lab Results  Component Value Date   CREATININE 0.93 09/12/2021   BUN 8 09/12/2021  NA 138 09/12/2021   K 3.6 09/12/2021   CL 107 09/12/2021   CO2 25 09/12/2021    Lab Results  Component Value Date   ALT 28 08/25/2021   AST 17 08/25/2021   ALKPHOS 234 (H) 08/25/2021   BILITOT 1.1 08/25/2021    No results found for: CHOL, HDL, LDLCALC, LDLDIRECT, TRIG, CHOLHDL No results found for: LABRPR, RPRTITER No results found for: HIV1RNAQUANT, HIV1RNAVL, CD4TABS  Problem List Items  Addressed This Visit       Other   Hardware complicating wound infection ([place]) - Primary   Relevant Medications   cefadroxil (DURICEF) 500 MG capsule   Medication monitoring encounter   PICC (peripherally inserted central catheter) in place   Assessment/Plan # Post surgical Deep lumbar wound infection involving hardware:  in the setting of surgical decompression and instrumented stabilization from L2 to sacrum  including revision of a previous surgery with pseudoarthrosis of L5-S1l on 3/21 - S/p debridement of lumbar wound 4/24 with OR cultures E coli. OR note with purulence in the sub cut space, also involving the hardware L5 and sacrum. Bone graft in that area was also involved ( removed), wound vac placed.  - Complete 6 weeks of IV cefazolin on 10/22/21 - Transition to cefadroxil $RemoveBefor'1000mg'[id number]$  po bid from 6/6 due to + infected hardware - Fu in a month    # PICC  - to be removed after last dose 10/22/21  # Medication Monitoring  10/16/21 cr 0.6, WBC 4.3, hb 10.1, plts 188  I have personally spent 40 minutes involved in face-to-face and non-face-to-face activities for this patient on the day of the visit including counseling of the patient and coordination of care.   Wilber Oliphant, [place] [facility] 10/22/2021, 11:35 AM

## 2021-10-23 DIAGNOSIS — R7881 Bacteremia: Secondary | ICD-10-CM | POA: Diagnosis not present

## 2021-10-23 DIAGNOSIS — B9620 Unspecified Escherichia coli [E. coli] as the cause of diseases classified elsewhere: Secondary | ICD-10-CM | POA: Diagnosis not present

## 2021-10-23 DIAGNOSIS — N179 Acute kidney failure, unspecified: Secondary | ICD-10-CM | POA: Diagnosis not present

## 2021-10-23 DIAGNOSIS — I5041 Acute combined systolic (congestive) and diastolic (congestive) heart failure: Secondary | ICD-10-CM | POA: Diagnosis not present

## 2021-10-23 DIAGNOSIS — Z4789 Encounter for other orthopedic aftercare: Secondary | ICD-10-CM | POA: Diagnosis not present

## 2021-10-23 DIAGNOSIS — G603 Idiopathic progressive neuropathy: Secondary | ICD-10-CM | POA: Diagnosis not present

## 2021-10-23 DIAGNOSIS — T8149XA Infection following a procedure, other surgical site, initial encounter: Secondary | ICD-10-CM | POA: Diagnosis not present

## 2021-10-24 DIAGNOSIS — G603 Idiopathic progressive neuropathy: Secondary | ICD-10-CM | POA: Diagnosis not present

## 2021-10-24 DIAGNOSIS — I5041 Acute combined systolic (congestive) and diastolic (congestive) heart failure: Secondary | ICD-10-CM | POA: Diagnosis not present

## 2021-10-24 DIAGNOSIS — B9620 Unspecified Escherichia coli [E. coli] as the cause of diseases classified elsewhere: Secondary | ICD-10-CM | POA: Diagnosis not present

## 2021-10-24 DIAGNOSIS — N179 Acute kidney failure, unspecified: Secondary | ICD-10-CM | POA: Diagnosis not present

## 2021-10-24 DIAGNOSIS — Z4789 Encounter for other orthopedic aftercare: Secondary | ICD-10-CM | POA: Diagnosis not present

## 2021-10-24 DIAGNOSIS — R7881 Bacteremia: Secondary | ICD-10-CM | POA: Diagnosis not present

## 2021-10-26 DIAGNOSIS — R7881 Bacteremia: Secondary | ICD-10-CM | POA: Diagnosis not present

## 2021-10-26 DIAGNOSIS — N179 Acute kidney failure, unspecified: Secondary | ICD-10-CM | POA: Diagnosis not present

## 2021-10-26 DIAGNOSIS — Z4789 Encounter for other orthopedic aftercare: Secondary | ICD-10-CM | POA: Diagnosis not present

## 2021-10-26 DIAGNOSIS — B9620 Unspecified Escherichia coli [E. coli] as the cause of diseases classified elsewhere: Secondary | ICD-10-CM | POA: Diagnosis not present

## 2021-10-26 DIAGNOSIS — G603 Idiopathic progressive neuropathy: Secondary | ICD-10-CM | POA: Diagnosis not present

## 2021-10-26 DIAGNOSIS — I5041 Acute combined systolic (congestive) and diastolic (congestive) heart failure: Secondary | ICD-10-CM | POA: Diagnosis not present

## 2021-10-27 DIAGNOSIS — K219 Gastro-esophageal reflux disease without esophagitis: Secondary | ICD-10-CM | POA: Diagnosis not present

## 2021-10-27 DIAGNOSIS — E7800 Pure hypercholesterolemia, unspecified: Secondary | ICD-10-CM | POA: Diagnosis not present

## 2021-10-27 DIAGNOSIS — I5041 Acute combined systolic (congestive) and diastolic (congestive) heart failure: Secondary | ICD-10-CM | POA: Diagnosis not present

## 2021-10-27 DIAGNOSIS — N179 Acute kidney failure, unspecified: Secondary | ICD-10-CM | POA: Diagnosis not present

## 2021-10-27 DIAGNOSIS — G4733 Obstructive sleep apnea (adult) (pediatric): Secondary | ICD-10-CM | POA: Diagnosis not present

## 2021-10-27 DIAGNOSIS — G603 Idiopathic progressive neuropathy: Secondary | ICD-10-CM | POA: Diagnosis not present

## 2021-10-27 DIAGNOSIS — D649 Anemia, unspecified: Secondary | ICD-10-CM | POA: Diagnosis not present

## 2021-10-27 DIAGNOSIS — E02 Subclinical iodine-deficiency hypothyroidism: Secondary | ICD-10-CM | POA: Diagnosis not present

## 2021-10-27 DIAGNOSIS — Z4801 Encounter for change or removal of surgical wound dressing: Secondary | ICD-10-CM | POA: Diagnosis not present

## 2021-10-27 DIAGNOSIS — Z8673 Personal history of transient ischemic attack (TIA), and cerebral infarction without residual deficits: Secondary | ICD-10-CM | POA: Diagnosis not present

## 2021-10-27 DIAGNOSIS — Z7951 Long term (current) use of inhaled steroids: Secondary | ICD-10-CM | POA: Diagnosis not present

## 2021-10-27 DIAGNOSIS — J439 Emphysema, unspecified: Secondary | ICD-10-CM | POA: Diagnosis not present

## 2021-10-27 DIAGNOSIS — J479 Bronchiectasis, uncomplicated: Secondary | ICD-10-CM | POA: Diagnosis not present

## 2021-10-27 DIAGNOSIS — J45909 Unspecified asthma, uncomplicated: Secondary | ICD-10-CM | POA: Diagnosis not present

## 2021-10-27 DIAGNOSIS — Z981 Arthrodesis status: Secondary | ICD-10-CM | POA: Diagnosis not present

## 2021-10-27 DIAGNOSIS — Z79890 Hormone replacement therapy (postmenopausal): Secondary | ICD-10-CM | POA: Diagnosis not present

## 2021-10-27 DIAGNOSIS — Z7901 Long term (current) use of anticoagulants: Secondary | ICD-10-CM | POA: Diagnosis not present

## 2021-10-30 DIAGNOSIS — Z4801 Encounter for change or removal of surgical wound dressing: Secondary | ICD-10-CM | POA: Diagnosis not present

## 2021-10-30 DIAGNOSIS — N179 Acute kidney failure, unspecified: Secondary | ICD-10-CM | POA: Diagnosis not present

## 2021-10-30 DIAGNOSIS — J45909 Unspecified asthma, uncomplicated: Secondary | ICD-10-CM | POA: Diagnosis not present

## 2021-10-30 DIAGNOSIS — J439 Emphysema, unspecified: Secondary | ICD-10-CM | POA: Diagnosis not present

## 2021-10-30 DIAGNOSIS — I5041 Acute combined systolic (congestive) and diastolic (congestive) heart failure: Secondary | ICD-10-CM | POA: Diagnosis not present

## 2021-10-30 DIAGNOSIS — G603 Idiopathic progressive neuropathy: Secondary | ICD-10-CM | POA: Diagnosis not present

## 2021-11-01 DIAGNOSIS — N179 Acute kidney failure, unspecified: Secondary | ICD-10-CM | POA: Diagnosis not present

## 2021-11-01 DIAGNOSIS — I5041 Acute combined systolic (congestive) and diastolic (congestive) heart failure: Secondary | ICD-10-CM | POA: Diagnosis not present

## 2021-11-01 DIAGNOSIS — J45909 Unspecified asthma, uncomplicated: Secondary | ICD-10-CM | POA: Diagnosis not present

## 2021-11-01 DIAGNOSIS — G603 Idiopathic progressive neuropathy: Secondary | ICD-10-CM | POA: Diagnosis not present

## 2021-11-01 DIAGNOSIS — J439 Emphysema, unspecified: Secondary | ICD-10-CM | POA: Diagnosis not present

## 2021-11-01 DIAGNOSIS — Z4801 Encounter for change or removal of surgical wound dressing: Secondary | ICD-10-CM | POA: Diagnosis not present

## 2021-11-02 DIAGNOSIS — T847XXD Infection and inflammatory reaction due to other internal orthopedic prosthetic devices, implants and grafts, subsequent encounter: Secondary | ICD-10-CM | POA: Diagnosis not present

## 2021-11-06 DIAGNOSIS — J45909 Unspecified asthma, uncomplicated: Secondary | ICD-10-CM | POA: Diagnosis not present

## 2021-11-06 DIAGNOSIS — G603 Idiopathic progressive neuropathy: Secondary | ICD-10-CM | POA: Diagnosis not present

## 2021-11-06 DIAGNOSIS — I5041 Acute combined systolic (congestive) and diastolic (congestive) heart failure: Secondary | ICD-10-CM | POA: Diagnosis not present

## 2021-11-06 DIAGNOSIS — Z4801 Encounter for change or removal of surgical wound dressing: Secondary | ICD-10-CM | POA: Diagnosis not present

## 2021-11-06 DIAGNOSIS — J439 Emphysema, unspecified: Secondary | ICD-10-CM | POA: Diagnosis not present

## 2021-11-06 DIAGNOSIS — N179 Acute kidney failure, unspecified: Secondary | ICD-10-CM | POA: Diagnosis not present

## 2021-11-08 DIAGNOSIS — Z4801 Encounter for change or removal of surgical wound dressing: Secondary | ICD-10-CM | POA: Diagnosis not present

## 2021-11-08 DIAGNOSIS — G603 Idiopathic progressive neuropathy: Secondary | ICD-10-CM | POA: Diagnosis not present

## 2021-11-08 DIAGNOSIS — I5041 Acute combined systolic (congestive) and diastolic (congestive) heart failure: Secondary | ICD-10-CM | POA: Diagnosis not present

## 2021-11-08 DIAGNOSIS — J45909 Unspecified asthma, uncomplicated: Secondary | ICD-10-CM | POA: Diagnosis not present

## 2021-11-08 DIAGNOSIS — N179 Acute kidney failure, unspecified: Secondary | ICD-10-CM | POA: Diagnosis not present

## 2021-11-08 DIAGNOSIS — J439 Emphysema, unspecified: Secondary | ICD-10-CM | POA: Diagnosis not present

## 2021-11-09 DIAGNOSIS — L089 Local infection of the skin and subcutaneous tissue, unspecified: Secondary | ICD-10-CM | POA: Diagnosis not present

## 2021-11-12 DIAGNOSIS — I5041 Acute combined systolic (congestive) and diastolic (congestive) heart failure: Secondary | ICD-10-CM | POA: Diagnosis not present

## 2021-11-12 DIAGNOSIS — J439 Emphysema, unspecified: Secondary | ICD-10-CM | POA: Diagnosis not present

## 2021-11-12 DIAGNOSIS — Z4801 Encounter for change or removal of surgical wound dressing: Secondary | ICD-10-CM | POA: Diagnosis not present

## 2021-11-12 DIAGNOSIS — J45909 Unspecified asthma, uncomplicated: Secondary | ICD-10-CM | POA: Diagnosis not present

## 2021-11-12 DIAGNOSIS — N179 Acute kidney failure, unspecified: Secondary | ICD-10-CM | POA: Diagnosis not present

## 2021-11-12 DIAGNOSIS — G603 Idiopathic progressive neuropathy: Secondary | ICD-10-CM | POA: Diagnosis not present

## 2021-11-14 DIAGNOSIS — G603 Idiopathic progressive neuropathy: Secondary | ICD-10-CM | POA: Diagnosis not present

## 2021-11-14 DIAGNOSIS — I5041 Acute combined systolic (congestive) and diastolic (congestive) heart failure: Secondary | ICD-10-CM | POA: Diagnosis not present

## 2021-11-14 DIAGNOSIS — J439 Emphysema, unspecified: Secondary | ICD-10-CM | POA: Diagnosis not present

## 2021-11-14 DIAGNOSIS — N179 Acute kidney failure, unspecified: Secondary | ICD-10-CM | POA: Diagnosis not present

## 2021-11-14 DIAGNOSIS — Z4801 Encounter for change or removal of surgical wound dressing: Secondary | ICD-10-CM | POA: Diagnosis not present

## 2021-11-14 DIAGNOSIS — J45909 Unspecified asthma, uncomplicated: Secondary | ICD-10-CM | POA: Diagnosis not present

## 2021-11-21 DIAGNOSIS — N179 Acute kidney failure, unspecified: Secondary | ICD-10-CM | POA: Diagnosis not present

## 2021-11-21 DIAGNOSIS — J439 Emphysema, unspecified: Secondary | ICD-10-CM | POA: Diagnosis not present

## 2021-11-21 DIAGNOSIS — Z4801 Encounter for change or removal of surgical wound dressing: Secondary | ICD-10-CM | POA: Diagnosis not present

## 2021-11-21 DIAGNOSIS — I5041 Acute combined systolic (congestive) and diastolic (congestive) heart failure: Secondary | ICD-10-CM | POA: Diagnosis not present

## 2021-11-21 DIAGNOSIS — G603 Idiopathic progressive neuropathy: Secondary | ICD-10-CM | POA: Diagnosis not present

## 2021-11-21 DIAGNOSIS — J45909 Unspecified asthma, uncomplicated: Secondary | ICD-10-CM | POA: Diagnosis not present

## 2021-11-26 DIAGNOSIS — J45909 Unspecified asthma, uncomplicated: Secondary | ICD-10-CM | POA: Diagnosis not present

## 2021-11-26 DIAGNOSIS — K219 Gastro-esophageal reflux disease without esophagitis: Secondary | ICD-10-CM | POA: Diagnosis not present

## 2021-11-26 DIAGNOSIS — Z7901 Long term (current) use of anticoagulants: Secondary | ICD-10-CM | POA: Diagnosis not present

## 2021-11-26 DIAGNOSIS — N179 Acute kidney failure, unspecified: Secondary | ICD-10-CM | POA: Diagnosis not present

## 2021-11-26 DIAGNOSIS — G4733 Obstructive sleep apnea (adult) (pediatric): Secondary | ICD-10-CM | POA: Diagnosis not present

## 2021-11-26 DIAGNOSIS — I5041 Acute combined systolic (congestive) and diastolic (congestive) heart failure: Secondary | ICD-10-CM | POA: Diagnosis not present

## 2021-11-26 DIAGNOSIS — Z7951 Long term (current) use of inhaled steroids: Secondary | ICD-10-CM | POA: Diagnosis not present

## 2021-11-26 DIAGNOSIS — J479 Bronchiectasis, uncomplicated: Secondary | ICD-10-CM | POA: Diagnosis not present

## 2021-11-26 DIAGNOSIS — Z8673 Personal history of transient ischemic attack (TIA), and cerebral infarction without residual deficits: Secondary | ICD-10-CM | POA: Diagnosis not present

## 2021-11-26 DIAGNOSIS — D649 Anemia, unspecified: Secondary | ICD-10-CM | POA: Diagnosis not present

## 2021-11-26 DIAGNOSIS — E02 Subclinical iodine-deficiency hypothyroidism: Secondary | ICD-10-CM | POA: Diagnosis not present

## 2021-11-26 DIAGNOSIS — Z4801 Encounter for change or removal of surgical wound dressing: Secondary | ICD-10-CM | POA: Diagnosis not present

## 2021-11-26 DIAGNOSIS — G603 Idiopathic progressive neuropathy: Secondary | ICD-10-CM | POA: Diagnosis not present

## 2021-11-26 DIAGNOSIS — Z981 Arthrodesis status: Secondary | ICD-10-CM | POA: Diagnosis not present

## 2021-11-26 DIAGNOSIS — J439 Emphysema, unspecified: Secondary | ICD-10-CM | POA: Diagnosis not present

## 2021-11-26 DIAGNOSIS — Z79890 Hormone replacement therapy (postmenopausal): Secondary | ICD-10-CM | POA: Diagnosis not present

## 2021-11-26 DIAGNOSIS — E7800 Pure hypercholesterolemia, unspecified: Secondary | ICD-10-CM | POA: Diagnosis not present

## 2021-11-27 ENCOUNTER — Ambulatory Visit (INDEPENDENT_AMBULATORY_CARE_PROVIDER_SITE_OTHER): Payer: Medicare Other | Admitting: Infectious Diseases

## 2021-11-27 ENCOUNTER — Encounter: Payer: Self-pay | Admitting: Infectious Diseases

## 2021-11-27 ENCOUNTER — Other Ambulatory Visit: Payer: Self-pay

## 2021-11-27 VITALS — BP 108/69 | HR 83 | Temp 97.9°F | Ht 64.0 in | Wt 205.0 lb

## 2021-11-27 DIAGNOSIS — T847XXD Infection and inflammatory reaction due to other internal orthopedic prosthetic devices, implants and grafts, subsequent encounter: Secondary | ICD-10-CM | POA: Diagnosis not present

## 2021-11-27 DIAGNOSIS — Z452 Encounter for adjustment and management of vascular access device: Secondary | ICD-10-CM

## 2021-11-27 DIAGNOSIS — Z5181 Encounter for therapeutic drug level monitoring: Secondary | ICD-10-CM | POA: Diagnosis not present

## 2021-11-27 NOTE — Progress Notes (Signed)
Patient Active Problem List   Diagnosis Date Noted   Hardware complicating wound infection ([place]) 10/08/2021   Open wound of lumbar region 10/08/2021   Medication monitoring encounter 10/08/2021   PICC (peripherally inserted central catheter) in place 10/08/2021   Wound infection after surgery 09/10/2021   Acute combined systolic and diastolic congestive heart failure ([place]) 08/24/2021   Bacteremia    Hyponatremia    Shock ([place]) 08/21/2021   Acute respiratory failure with hypoxia (HCC)    AKI (acute kidney injury) ([place])    Nausea    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   OA (osteoarthritis) of knee 12/06/2019   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/2019   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 18 g 12   apixaban (ELIQUIS) 5 MG TABS tablet Take 1 tablet (5 mg total) by mouth 2 (two) times daily. 60 tablet 3   Ascorbic Acid (VITAMIN C) 1000 MG tablet Take 1,000 mg by mouth daily.     CALCIUM-VITAMIN D PO Take 1 tablet by mouth in the morning and at bedtime.     cefadroxil (DURICEF) 500 MG capsule Take 2 capsules (1,000 mg total) by mouth 2 (two) times daily. 120 capsule 5   cholestyramine (QUESTRAN) 4 g packet Take 4 g by mouth daily.     desvenlafaxine (PRISTIQ) 50 MG 24 hr tablet desvenlafaxine succinate ER 50 mg tablet,extended release 24 hr     Esomeprazole Magnesium (NEXIUM 24HR PO)  Take 22.3 mg by mouth in the morning and at bedtime.     Fluticasone-Umeclidin-Vilant (TRELEGY ELLIPTA) 100-62.5-25 MCG/ACT AEPB INHALE 1 PUFF ONCE DAILY (Patient taking differently: Inhale 1 puff into the lungs daily.) 60 each 11   furosemide (LASIX) 20 MG tablet Take 1 tablet (20 mg total) by mouth as needed. For weight gain 3 pounds in a day and 5 pounds in a week. 30 tablet 3   methocarbamol (ROBAXIN) 500 MG tablet Take 1 tablet (500 mg total) by mouth every 6 (six) hours as needed for muscle spasms. 30 tablet 2   Multiple Vitamin (MULTIVITAMIN WITH MINERALS) TABS tablet Take 1 tablet by mouth daily.     ondansetron (ZOFRAN) 4 MG tablet Take 1 tablet (4 mg total) by mouth every 6 (six) hours as needed for nausea or vomiting. 20 tablet 2   oxyCODONE-acetaminophen (PERCOCET/ROXICET) 5-325 MG tablet Take 1-2 tablets by mouth every 4 (four) hours as needed for moderate pain or severe pain. (Patient taking differently: Take 1-2 tablets by mouth every 4 (four) hours as needed for moderate pain.) 30 tablet 0   pregabalin (LYRICA) 100 MG capsule Take 100-200 mg by mouth See admin instructions. Take 100 mg by mouth in the morning and 200 mg by mouth in the evening     Propylene Glycol (SYSTANE BALANCE OP) Place 1 drop into both eyes in the morning and at bedtime.     zinc gluconate 50 MG tablet Take 50  mg by mouth daily.     Cholecalciferol (VITAMIN D) 125 MCG (5000 UT) CAPS Take 5,000 Units by mouth daily.     dexamethasone (DECADRON) 1 MG tablet dexamethasone 1 mg tablet  TAKE 2 TABLETS BY MOUTH TWICE DAILY FOR 2 DAYS THEN 1 TWICE DAILY FOR 2 DAYS THEN 1 ONCE DAILY FOR 2 DAYS (Patient not taking: Reported on 11/27/2021)     estradiol (DOTTI) 0.025 MG/24HR [place] 0.025 mg/24 hr transdermal patch  APPLY 1 TOPICALLY TWICE A WEEK (Patient not taking: Reported on 11/27/2021)     tiZANidine (ZANAFLEX) 2 MG tablet tizanidine 2 mg tablet  TAKE 1 TABLET BY MOUTH EVERY 6 HOURS AS NEEDED FOR MUSCLE SPASM (Patient not  taking: Reported on 11/27/2021)     No current facility-administered medications on file prior to visit.   Subjective: Here for HFU for post surgical deep lumbar wound infection involving the hardware. She is accompanied by her husband. Completed IV cefazolin through PICC on 6/5. PICC line has been removed. She has started taking PO cefadroxil following completion of IV abtx. Denies missing doses or any issues with antibiotics like nausea, vomiting, rashes or diarrhea. Back pain is minimal and sometimes  it wraps around her flanks. She follows with [facility] wound care nurse and wound has been decreasing in size. Very minimal drainage if any from back wound, non foul smelling. She has a Fu with Neurosurgery later this week.  Review of Systems: all systems reviewed with pertinent positives and negatives as listed above  Past Medical History:  Diagnosis Date   Arthritis    osteo   Asthma    Asthma    Basal cell carcinoma    face   COPD (chronic obstructive pulmonary disease) ([place])    COVID    2022   GERD (gastroesophageal reflux disease)    Heart palpitations    History of hiatal hernia 1991   History of iron deficiency anemia    History of migraine    while taking birth control pills   Hypercholesteremia    Neuropathy    left greater than right feet   Obesity    Obstructive sleep apnea 07/09/2012   CPAP    Peripheral neuropathy    Pneumonia    as a child   PONV (postoperative nausea and vomiting)    not since 1967   Pulmonary embolism ([place])    Shortness of breath    with exertion   Stroke ([place])    TIA (transient ischemic attack) 2014   TIA (transient ischemic attack) 2014   no residual - no follow up needed   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   Past Surgical History:  Procedure Laterality Date   APPLICATION OF ROBOTIC ASSISTANCE FOR SPINAL PROCEDURE N/A 08/07/2021   Procedure: APPLICATION OF ROBOTIC ASSISTANCE FOR SPINAL PROCEDURE;  Surgeon: Kristeen Miss, MD;  Location: [facility];  Service: Neurosurgery;  Laterality: N/A;   BREAST BIOPSY Right    BREAST CYST EXCISION Left    CARDIAC CATHETERIZATION  07/09/2012   cataracts Bilateral 2013   this was second one   CHOLECYSTECTOMY N/A 02/18/2017   Procedure: LAPAROSCOPIC CHOLECYSTECTOMY WITH INTRAOPERATIVE CHOLANGIOGRAM;  Surgeon: Erroll Luna, MD;  Location: [facility];  Service: General;  Laterality: N/A;   COLONOSCOPY     DILATION AND CURETTAGE OF UTERUS     EYE SURGERY Bilateral 2014   cataract removal 2013 and 2014   FINGER SURGERY Right 1971   ring finger repair   KNEE SURGERY Right  arthroscopy   LEFT HEART CATHETERIZATION WITH CORONARY ANGIOGRAM N/A 07/09/2012   Procedure: LEFT HEART CATHETERIZATION WITH CORONARY ANGIOGRAM;  Surgeon: Sinclair Grooms, MD;  Location: [facility] CATH LAB;  Service: Cardiovascular;  Laterality: N/A;   LUMBAR FUSION     L3-S1   LUMBAR WOUND DEBRIDEMENT N/A 09/10/2021   Procedure: LUMBAR WOUND DEBRIDEMENT;  Surgeon: Kristeen Miss, MD;  Location: [facility];  Service: Neurosurgery;  Laterality: N/A;   TONSILLECTOMY     TOTAL KNEE ARTHROPLASTY Right 12/06/2019   Procedure: TOTAL KNEE ARTHROPLASTY;  Surgeon: Gaynelle Arabian, MD;  Location: [facility];  Service: Orthopedics;  Laterality: Right;  56mn   TUBAL LIGATION      Social History   Tobacco Use   Smoking status: Never   Smokeless tobacco: Never   Tobacco comments:    Had tried when younger  Vaping Use   Vaping Use: Never used  Substance Use Topics   Alcohol use: No   Drug use: No    Family History  Problem Relation Age of Onset   Diabetes Mellitus II Mother    CVA Father    Heart attack Father    Lung cancer Father    Tuberculosis Sister        bovine    Allergies  Allergen Reactions   Sulfa Antibiotics Other (See Comments) and Palpitations   Actifed [Triprolidine-Pse] Palpitations   Lipitor [Atorvastatin] Other (See Comments)    Aches and pains   Triprolidine-Pseudoephedrine     Other reaction(s): racing heart    Bupropion Hcl Itching and Rash    Health Maintenance  Topic Date Due   Hepatitis C Screening  Never done   TETANUS/TDAP  Never done   Zoster Vaccines- Shingrix (1 of 2) Never done   DEXA SCAN  Never done   COVID-19 Vaccine (7 - Mixed Product series) 07/30/2020   INFLUENZA VACCINE  12/18/2021   Pneumonia Vaccine 80 Years old  Completed   HPV VACCINES  Aged Out    Objective: BP 108/69   Pulse 83   Temp 97.9 F (36.6 C) (Oral)   Ht '5\' 4"'$  (1.626 m)   Wt 205 lb (93 kg)   SpO2 97%   BMI 35.19 kg/m   Physical Exam Constitutional:      Appearance: Normal appearance. Obese  HENT:     Head: Normocephalic and atraumatic.      Mouth: Mucous membranes are moist.  Eyes:    Conjunctiva/sclera: Conjunctivae normal.     Pupils:   Cardiovascular:     Rate and Rhythm: Normal rate and regular rhythm.     Heart sounds:   Pulmonary:     Effort: Pulmonary effort is normal.     Breath sounds: Normal breath sounds.   Abdominal:     General: Non distended     Palpations: soft.   Musculoskeletal:        General: Normal range of motion.   Back    Skin:    General: Skin is warm and dry.     Comments:  Neurological:     General: grossly non focal     Mental Status: awake, alert and oriented to person, place, and time.   Psychiatric:        Mood and Affect: Mood normal.   Lab Results Lab Results  Component Value Date   WBC 10.1 09/11/2021   HGB 9.1 (L) 09/11/2021   HCT 28.1 (L) 09/11/2021   MCV 94.6 09/11/2021   PLT 396 09/11/2021  Lab Results  Component Value Date   CREATININE 0.93 09/12/2021   BUN 8 09/12/2021   NA 138 09/12/2021   K 3.6 09/12/2021   CL 107 09/12/2021   CO2 25 09/12/2021    Lab Results  Component Value Date   ALT 28 08/25/2021   AST 17 08/25/2021   ALKPHOS 234 (H) 08/25/2021   BILITOT 1.1 08/25/2021    No results found for: "CHOL", "HDL", "LDLCALC", "LDLDIRECT", "TRIG", "CHOLHDL" No results found for: "LABRPR", "RPRTITER" No results  found for: "HIV1RNAQUANT", "HIV1RNAVL", "CD4TABS"  Problem List Items Addressed This Visit       Other   Hardware complicating wound infection ([place])   Medication monitoring encounter - Primary   Relevant Orders   CBC (Completed)   Basic metabolic panel (Completed)   C-reactive protein (Completed)   Sedimentation rate (Completed)   PICC (peripherally inserted central catheter) removal    Assessment/Plan # Post surgical Deep lumbar wound infection involving hardware:  in the setting of surgical decompression and instrumented stabilization from L2 to sacrum  including revision of a previous surgery with pseudoarthrosis of L5-S1l on 3/21 - S/p debridement of lumbar wound 4/24 with OR cultures E coli. OR note with purulence in the sub cut space, also involving the hardware L5 and sacrum. Bone graft in that area was also involved ( removed), wound vac placed.  - Completed 6 weeks of IV cefazolin on 10/22/21 followed by PO cefadroxil '1000mg'$  po bid from 6/6 due to + infected hardware - Fu in 3 months  - Fu with Neurosurgery    # PICC  - removed   # Medication Monitoring  - Labs today   I have personally spent 40 minutes involved in face-to-face and non-face-to-face activities for this patient on the day of the visit including counseling of the patient and coordination of care.   Wilber Oliphant, [place] [facility] Infectious Disease [facility] 11/27/2021, 11:17 AM

## 2021-11-28 DIAGNOSIS — G603 Idiopathic progressive neuropathy: Secondary | ICD-10-CM | POA: Diagnosis not present

## 2021-11-28 DIAGNOSIS — Z452 Encounter for adjustment and management of vascular access device: Secondary | ICD-10-CM | POA: Insufficient documentation

## 2021-11-28 DIAGNOSIS — J439 Emphysema, unspecified: Secondary | ICD-10-CM | POA: Diagnosis not present

## 2021-11-28 DIAGNOSIS — N179 Acute kidney failure, unspecified: Secondary | ICD-10-CM | POA: Diagnosis not present

## 2021-11-28 DIAGNOSIS — J45909 Unspecified asthma, uncomplicated: Secondary | ICD-10-CM | POA: Diagnosis not present

## 2021-11-28 DIAGNOSIS — Z4801 Encounter for change or removal of surgical wound dressing: Secondary | ICD-10-CM | POA: Diagnosis not present

## 2021-11-28 DIAGNOSIS — I5041 Acute combined systolic (congestive) and diastolic (congestive) heart failure: Secondary | ICD-10-CM | POA: Diagnosis not present

## 2021-11-28 LAB — CBC
HCT: 34.7 % — ABNORMAL LOW (ref 35.0–45.0)
Hemoglobin: 11.2 g/dL — ABNORMAL LOW (ref 11.7–15.5)
MCH: 27.3 pg (ref 27.0–33.0)
MCHC: 32.3 g/dL (ref 32.0–36.0)
MCV: 84.4 fL (ref 80.0–100.0)
MPV: 11.1 fL (ref 7.5–12.5)
Platelets: 230 10*3/uL (ref 140–400)
RBC: 4.11 10*6/uL (ref 3.80–5.10)
RDW: 13.2 % (ref 11.0–15.0)
WBC: 6.6 10*3/uL (ref 3.8–10.8)

## 2021-11-28 LAB — BASIC METABOLIC PANEL
BUN: 16 mg/dL (ref 7–25)
CO2: 27 mmol/L (ref 20–32)
Calcium: 9.1 mg/dL (ref 8.6–10.4)
Chloride: 105 mmol/L (ref 98–110)
Creat: 1 mg/dL (ref 0.60–1.00)
Glucose, Bld: 74 mg/dL (ref 65–99)
Potassium: 3.8 mmol/L (ref 3.5–5.3)
Sodium: 139 mmol/L (ref 135–146)

## 2021-11-28 LAB — SEDIMENTATION RATE: Sed Rate: 39 mm/h — ABNORMAL HIGH (ref 0–30)

## 2021-11-28 LAB — C-REACTIVE PROTEIN: CRP: 58.2 mg/L — ABNORMAL HIGH (ref <8.0)

## 2021-11-29 DIAGNOSIS — K219 Gastro-esophageal reflux disease without esophagitis: Secondary | ICD-10-CM | POA: Diagnosis not present

## 2021-11-29 DIAGNOSIS — G894 Chronic pain syndrome: Secondary | ICD-10-CM | POA: Diagnosis not present

## 2021-11-29 DIAGNOSIS — E785 Hyperlipidemia, unspecified: Secondary | ICD-10-CM | POA: Diagnosis not present

## 2021-11-29 DIAGNOSIS — J45909 Unspecified asthma, uncomplicated: Secondary | ICD-10-CM | POA: Diagnosis not present

## 2021-11-29 DIAGNOSIS — M1711 Unilateral primary osteoarthritis, right knee: Secondary | ICD-10-CM | POA: Diagnosis not present

## 2021-11-29 DIAGNOSIS — G629 Polyneuropathy, unspecified: Secondary | ICD-10-CM | POA: Diagnosis not present

## 2021-11-30 DIAGNOSIS — T148XXA Other injury of unspecified body region, initial encounter: Secondary | ICD-10-CM | POA: Diagnosis not present

## 2021-12-03 ENCOUNTER — Other Ambulatory Visit: Payer: Self-pay | Admitting: Neurological Surgery

## 2021-12-03 DIAGNOSIS — Z4801 Encounter for change or removal of surgical wound dressing: Secondary | ICD-10-CM | POA: Diagnosis not present

## 2021-12-03 DIAGNOSIS — G603 Idiopathic progressive neuropathy: Secondary | ICD-10-CM | POA: Diagnosis not present

## 2021-12-03 DIAGNOSIS — J439 Emphysema, unspecified: Secondary | ICD-10-CM | POA: Diagnosis not present

## 2021-12-03 DIAGNOSIS — N179 Acute kidney failure, unspecified: Secondary | ICD-10-CM | POA: Diagnosis not present

## 2021-12-03 DIAGNOSIS — J45909 Unspecified asthma, uncomplicated: Secondary | ICD-10-CM | POA: Diagnosis not present

## 2021-12-03 DIAGNOSIS — I5041 Acute combined systolic (congestive) and diastolic (congestive) heart failure: Secondary | ICD-10-CM | POA: Diagnosis not present

## 2021-12-03 DIAGNOSIS — L089 Local infection of the skin and subcutaneous tissue, unspecified: Secondary | ICD-10-CM

## 2021-12-06 DIAGNOSIS — N179 Acute kidney failure, unspecified: Secondary | ICD-10-CM | POA: Diagnosis not present

## 2021-12-06 DIAGNOSIS — Z4801 Encounter for change or removal of surgical wound dressing: Secondary | ICD-10-CM | POA: Diagnosis not present

## 2021-12-06 DIAGNOSIS — J45909 Unspecified asthma, uncomplicated: Secondary | ICD-10-CM | POA: Diagnosis not present

## 2021-12-06 DIAGNOSIS — J439 Emphysema, unspecified: Secondary | ICD-10-CM | POA: Diagnosis not present

## 2021-12-06 DIAGNOSIS — G603 Idiopathic progressive neuropathy: Secondary | ICD-10-CM | POA: Diagnosis not present

## 2021-12-06 DIAGNOSIS — I5041 Acute combined systolic (congestive) and diastolic (congestive) heart failure: Secondary | ICD-10-CM | POA: Diagnosis not present

## 2021-12-10 ENCOUNTER — Ambulatory Visit
Admission: RE | Admit: 2021-12-10 | Discharge: 2021-12-10 | Disposition: A | Discharge status: Home / Self Care | Payer: Medicare Other | Source: Ambulatory Visit | Attending: Neurological Surgery | Admitting: Neurological Surgery

## 2021-12-10 DIAGNOSIS — L089 Local infection of the skin and subcutaneous tissue, unspecified: Secondary | ICD-10-CM

## 2021-12-10 DIAGNOSIS — M5136 Other intervertebral disc degeneration, lumbar region: Secondary | ICD-10-CM | POA: Diagnosis not present

## 2021-12-10 DIAGNOSIS — M48061 Spinal stenosis, lumbar region without neurogenic claudication: Secondary | ICD-10-CM | POA: Diagnosis not present

## 2021-12-11 ENCOUNTER — Telehealth: Payer: Self-pay | Admitting: Pharmacist

## 2021-12-11 DIAGNOSIS — G603 Idiopathic progressive neuropathy: Secondary | ICD-10-CM | POA: Diagnosis not present

## 2021-12-11 DIAGNOSIS — B3731 Acute candidiasis of vulva and vagina: Secondary | ICD-10-CM

## 2021-12-11 DIAGNOSIS — N179 Acute kidney failure, unspecified: Secondary | ICD-10-CM | POA: Diagnosis not present

## 2021-12-11 DIAGNOSIS — Z4801 Encounter for change or removal of surgical wound dressing: Secondary | ICD-10-CM | POA: Diagnosis not present

## 2021-12-11 DIAGNOSIS — J439 Emphysema, unspecified: Secondary | ICD-10-CM | POA: Diagnosis not present

## 2021-12-11 DIAGNOSIS — I5041 Acute combined systolic (congestive) and diastolic (congestive) heart failure: Secondary | ICD-10-CM | POA: Diagnosis not present

## 2021-12-11 DIAGNOSIS — J45909 Unspecified asthma, uncomplicated: Secondary | ICD-10-CM | POA: Diagnosis not present

## 2021-12-11 MED ORDER — FLUCONAZOLE 150 MG PO TABS: 150.0000 mg | ORAL_TABLET | Freq: Once | ORAL | 0 refills | Status: AC

## 2021-12-11 NOTE — Telephone Encounter (Signed)
Thank you Amanda!

## 2021-12-11 NOTE — Telephone Encounter (Signed)
Patient experiencing pruritis around labia and slight vaginal discharge despite appropriate cleaning. States she has never experienced these symptoms before and that she is concerned the antibiotics are causing them. She has been using a cream that she can't remember the name of which seems to relieve the itchiness but rebounds when she stops using. Will send in fluconazole '150mg'$  x1 for likely yeast infection.  Alfonse Spruce, PharmD, CPP, [place] Clinical Pharmacist Practitioner Infectious [facility]

## 2021-12-14 DIAGNOSIS — N179 Acute kidney failure, unspecified: Secondary | ICD-10-CM | POA: Diagnosis not present

## 2021-12-14 DIAGNOSIS — G603 Idiopathic progressive neuropathy: Secondary | ICD-10-CM | POA: Diagnosis not present

## 2021-12-14 DIAGNOSIS — Z4801 Encounter for change or removal of surgical wound dressing: Secondary | ICD-10-CM | POA: Diagnosis not present

## 2021-12-14 DIAGNOSIS — J45909 Unspecified asthma, uncomplicated: Secondary | ICD-10-CM | POA: Diagnosis not present

## 2021-12-14 DIAGNOSIS — I5041 Acute combined systolic (congestive) and diastolic (congestive) heart failure: Secondary | ICD-10-CM | POA: Diagnosis not present

## 2021-12-14 DIAGNOSIS — J439 Emphysema, unspecified: Secondary | ICD-10-CM | POA: Diagnosis not present

## 2021-12-17 DIAGNOSIS — S32009K Unspecified fracture of unspecified lumbar vertebra, subsequent encounter for fracture with nonunion: Secondary | ICD-10-CM | POA: Diagnosis not present

## 2021-12-17 MED ORDER — FLUCONAZOLE 150 MG PO TABS: 150.0000 mg | ORAL_TABLET | Freq: Every day | ORAL | 3 refills | Status: AC

## 2021-12-17 NOTE — Telephone Encounter (Signed)
Patient called office stating she is still experiencing vaginal itching. States she took Fluconazole on Thursday and has not had any relief. Will forward message to MD and pharmacy team. Christy Stephenson, [place]

## 2021-12-17 NOTE — Telephone Encounter (Signed)
Refill sent to [facility]. Patient made aware. Will call if she continues to have vaginal itching. Leatrice Jewels, RMA

## 2021-12-17 NOTE — Addendum Note (Signed)
Addended by: Leatrice Jewels on: 12/17/2021 04:14 PM   Modules accepted: Orders

## 2021-12-18 DIAGNOSIS — J45909 Unspecified asthma, uncomplicated: Secondary | ICD-10-CM | POA: Diagnosis not present

## 2021-12-18 DIAGNOSIS — Z4801 Encounter for change or removal of surgical wound dressing: Secondary | ICD-10-CM | POA: Diagnosis not present

## 2021-12-18 DIAGNOSIS — J439 Emphysema, unspecified: Secondary | ICD-10-CM | POA: Diagnosis not present

## 2021-12-18 DIAGNOSIS — G603 Idiopathic progressive neuropathy: Secondary | ICD-10-CM | POA: Diagnosis not present

## 2021-12-18 DIAGNOSIS — N179 Acute kidney failure, unspecified: Secondary | ICD-10-CM | POA: Diagnosis not present

## 2021-12-18 DIAGNOSIS — I5041 Acute combined systolic (congestive) and diastolic (congestive) heart failure: Secondary | ICD-10-CM | POA: Diagnosis not present

## 2021-12-20 DIAGNOSIS — J439 Emphysema, unspecified: Secondary | ICD-10-CM | POA: Diagnosis not present

## 2021-12-20 DIAGNOSIS — Z4801 Encounter for change or removal of surgical wound dressing: Secondary | ICD-10-CM | POA: Diagnosis not present

## 2021-12-20 DIAGNOSIS — G894 Chronic pain syndrome: Secondary | ICD-10-CM | POA: Diagnosis not present

## 2021-12-20 DIAGNOSIS — J45909 Unspecified asthma, uncomplicated: Secondary | ICD-10-CM | POA: Diagnosis not present

## 2021-12-20 DIAGNOSIS — I5041 Acute combined systolic (congestive) and diastolic (congestive) heart failure: Secondary | ICD-10-CM | POA: Diagnosis not present

## 2021-12-20 DIAGNOSIS — K219 Gastro-esophageal reflux disease without esophagitis: Secondary | ICD-10-CM | POA: Diagnosis not present

## 2021-12-20 DIAGNOSIS — N179 Acute kidney failure, unspecified: Secondary | ICD-10-CM | POA: Diagnosis not present

## 2021-12-20 DIAGNOSIS — M1711 Unilateral primary osteoarthritis, right knee: Secondary | ICD-10-CM | POA: Diagnosis not present

## 2021-12-20 DIAGNOSIS — G603 Idiopathic progressive neuropathy: Secondary | ICD-10-CM | POA: Diagnosis not present

## 2021-12-20 DIAGNOSIS — E785 Hyperlipidemia, unspecified: Secondary | ICD-10-CM | POA: Diagnosis not present

## 2021-12-25 DIAGNOSIS — G603 Idiopathic progressive neuropathy: Secondary | ICD-10-CM | POA: Diagnosis not present

## 2021-12-25 DIAGNOSIS — N179 Acute kidney failure, unspecified: Secondary | ICD-10-CM | POA: Diagnosis not present

## 2021-12-25 DIAGNOSIS — J45909 Unspecified asthma, uncomplicated: Secondary | ICD-10-CM | POA: Diagnosis not present

## 2021-12-25 DIAGNOSIS — I5041 Acute combined systolic (congestive) and diastolic (congestive) heart failure: Secondary | ICD-10-CM | POA: Diagnosis not present

## 2021-12-25 DIAGNOSIS — Z4801 Encounter for change or removal of surgical wound dressing: Secondary | ICD-10-CM | POA: Diagnosis not present

## 2021-12-25 DIAGNOSIS — J439 Emphysema, unspecified: Secondary | ICD-10-CM | POA: Diagnosis not present

## 2022-01-23 DIAGNOSIS — M549 Dorsalgia, unspecified: Secondary | ICD-10-CM | POA: Diagnosis not present

## 2022-01-23 DIAGNOSIS — M62830 Muscle spasm of back: Secondary | ICD-10-CM | POA: Diagnosis not present

## 2022-01-25 ENCOUNTER — Encounter: Payer: Self-pay | Admitting: Cardiovascular Disease

## 2022-01-25 ENCOUNTER — Ambulatory Visit: Payer: Medicare Other | Attending: Cardiovascular Disease | Admitting: Cardiovascular Disease

## 2022-01-25 VITALS — BP 108/68 | HR 77 | Ht 64.0 in | Wt 201.4 lb

## 2022-01-25 DIAGNOSIS — E782 Mixed hyperlipidemia: Secondary | ICD-10-CM

## 2022-01-25 DIAGNOSIS — Z8249 Family history of ischemic heart disease and other diseases of the circulatory system: Secondary | ICD-10-CM

## 2022-01-25 DIAGNOSIS — I5042 Chronic combined systolic (congestive) and diastolic (congestive) heart failure: Secondary | ICD-10-CM | POA: Diagnosis not present

## 2022-01-25 DIAGNOSIS — I519 Heart disease, unspecified: Secondary | ICD-10-CM | POA: Diagnosis not present

## 2022-01-25 DIAGNOSIS — E785 Hyperlipidemia, unspecified: Secondary | ICD-10-CM | POA: Insufficient documentation

## 2022-01-25 NOTE — Assessment & Plan Note (Signed)
2D echo performed during her hospitalization in April revealed an EF of 30 to 35% with mild to moderate MR and TR.  Her blood pressure would not allow treatment with beta-blocker and at the time she had renal insufficiency which precluded use of ARB/ACE.  She currently denies chest pain or shortness of breath.  I am going to repeat a 2D echo to see if her LV function improved.

## 2022-01-25 NOTE — Assessment & Plan Note (Signed)
Father had a myocardial infarction at age 80.

## 2022-01-25 NOTE — Patient Instructions (Signed)
Medication Instructions:  Your physician recommends that you continue on your current medications as directed. Please refer to the Current Medication list given to you today.  *If you need a refill on your cardiac medications before your next appointment, please call your pharmacy*   Testing/Procedures: Your physician has requested that you have an echocardiogram. Echocardiography is a painless test that uses sound waves to create images of your heart. It provides your doctor with information about the size and shape of your heart and how well your heart's chambers and valves are working. This procedure takes approximately one hour. There are no restrictions for this procedure. This procedure will be done at [address]   Follow-Up: At [facility], you and your health needs are our priority.  As part of our continuing mission to provide you with exceptional heart care, we have created designated Provider Care Teams.  These Care Teams include your primary Cardiologist (physician) and Advanced Practice Providers (APPs -  Physician Assistants and Nurse Practitioners) who all work together to provide you with the care you need, when you need it.  We recommend signing up for the patient portal called "MyChart".  Sign up information is provided on this After Visit Summary.  MyChart is used to connect with patients for Virtual Visits (Telemedicine).  Patients are able to view lab/test results, encounter notes, upcoming appointments, etc.  Non-urgent messages can be sent to your provider as well.   To learn more about what you can do with MyChart, go to [email or web address].    Your next appointment:   We will see you on an as needed basis.   Provider:   Quay Burow, MD

## 2022-01-25 NOTE — Assessment & Plan Note (Signed)
History of hyperlipidemia with lipid profile revealing total cholesterol 236 with LDL 139 HDL 57.  She is currently not on statin therapy.

## 2022-01-25 NOTE — Progress Notes (Unsigned)
01/25/2022 Christy Stephenson   July 03, 1940  [id number]  Primary Physician Seward Carol, MD Primary Cardiologist: Lorretta Harp MD FACP, [place], [place], [place]  HPI:  Christy Stephenson is a 81 y.o. mild to moderately overweight married Caucasian for female father of 2, grandmother of 2 grandchildren who is accompanied by her husband Hollice Espy today.  She is here in follow-up after I saw her in consultation during her hospitalization/7/23.  She is a retired Marine scientist.  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  She had no prior cardiac history.  I saw her during her hospitalization postop back surgery because of shortness of breath and LV dysfunction.  She had a VQ scan that was suggestive of pulmonary embolism and was discharged home on oral anticoagulation.  Her cardiac enzymes peaked in the 3000 range thought to be related to demand ischemia.  Her renal function and blood pressure precluded guideline directed optimal medical therapy.  Her 2D echo during her hospitalization revealed an EF of 30% with mild to moderate MR and TR.  She is recovered nicely since discharge.  She currently denies chest pain or shortness of breath.  She does have hyperlipidemia with a total cholesterol 236, LDL of 139 and HDL of 57 not on statin therapy.   Current Meds  Medication Sig   albuterol (VENTOLIN HFA) 108 (90 Base) MCG/ACT inhaler Inhale 2 puffs into the lungs every 6 (six) hours as needed for wheezing or shortness of breath.   Ascorbic Acid (VITAMIN C) 1000 MG tablet Take 1,000 mg by mouth daily.   CALCIUM-VITAMIN D PO Take 1 tablet by mouth in the morning and at bedtime.   cefadroxil (DURICEF) 500 MG capsule Take 2 capsules (1,000 mg total) by mouth 2 (two) times daily.   Cholecalciferol (VITAMIN D) 125 MCG (5000 UT) CAPS Take 5,000 Units by mouth daily.   desvenlafaxine (PRISTIQ) 50 MG 24 hr tablet desvenlafaxine succinate ER 50 mg tablet,extended release 24  hr   Esomeprazole Magnesium (NEXIUM 24HR PO) Take 22.3 mg by mouth in the morning and at bedtime.   Fluticasone-Umeclidin-Vilant (TRELEGY ELLIPTA) 100-62.5-25 MCG/ACT AEPB INHALE 1 PUFF ONCE DAILY (Patient taking differently: Inhale 1 puff into the lungs daily.)   methocarbamol (ROBAXIN) 500 MG tablet Take 1 tablet (500 mg total) by mouth every 6 (six) hours as needed for muscle spasms.   Multiple Vitamin (MULTIVITAMIN WITH MINERALS) TABS tablet Take 1 tablet by mouth daily.   oxyCODONE-acetaminophen (PERCOCET/ROXICET) 5-325 MG tablet Take 1-2 tablets by mouth every 4 (four) hours as needed for moderate pain or severe pain. (Patient taking differently: Take 1-2 tablets by mouth every 4 (four) hours as needed for moderate pain.)   pregabalin (LYRICA) 100 MG capsule Take 100-200 mg by mouth See admin instructions. Take 100 mg by mouth in the morning and 200 mg by mouth in the evening   Propylene Glycol (SYSTANE BALANCE OP) Place 1 drop into both eyes in the morning and at bedtime.   rivaroxaban (XARELTO) 20 MG TABS tablet Take 20 mg by mouth daily with supper.   zinc gluconate 50 MG tablet Take 50 mg by mouth daily.     Allergies  Allergen Reactions   Sulfa Antibiotics Other (See Comments) and Palpitations   Actifed [Triprolidine-Pse] Palpitations   Lipitor [Atorvastatin] Other (See Comments)    Aches and pains   Triprolidine-Pseudoephedrine     Other reaction(s): racing heart  Bupropion Hcl Itching and Rash    Social History   Socioeconomic History   Marital status: Married    Spouse name: Hollice Espy   Number of children: 2   Years of education: AD   Highest education level: Not on file  Occupational History   Occupation: retired    Fish farm manager: [employer]    Comment: AD Nursing  Tobacco Use   Smoking status: Never   Smokeless tobacco: Never   Tobacco comments:    Had tried when younger  Vaping Use   Vaping Use: Never used  Substance and Sexual Activity   Alcohol use: No    Drug use: No   Sexual activity: Yes    Birth control/protection: Post-menopausal  Other Topics Concern   Not on file  Social History Narrative   Not on file   Social Determinants of Health   Financial Resource Strain: Not on file  Food Insecurity: Not on file  Transportation Needs: Not on file  Physical Activity: Not on file  Stress: Not on file  Social Connections: Not on file  Intimate Partner Violence: Not on file     Review of Systems: General: negative for chills, fever, night sweats or weight changes.  Cardiovascular: negative for chest pain, dyspnea on exertion, edema, orthopnea, palpitations, paroxysmal nocturnal dyspnea or shortness of breath Dermatological: negative for rash Respiratory: negative for cough or wheezing Urologic: negative for hematuria Abdominal: negative for nausea, vomiting, diarrhea, bright red blood per rectum, melena, or hematemesis Neurologic: negative for visual changes, syncope, or dizziness All other systems reviewed and are otherwise negative except as noted above.    Blood pressure 108/68, pulse 77, height '5\' 4"'$  (1.626 m), weight 201 lb 6.4 oz (91.4 kg), SpO2 97 %.  General appearance: alert and no distress Neck: no adenopathy, no carotid bruit, no JVD, supple, symmetrical, trachea midline, and thyroid not enlarged, symmetric, no tenderness/mass/nodules Lungs: clear to auscultation bilaterally Heart: regular rate and rhythm, S1, S2 normal, no murmur, click, rub or gallop Extremities: extremities normal, atraumatic, no cyanosis or edema Pulses: 2+ and symmetric Skin: Skin color, texture, turgor normal. No rashes or lesions Neurologic: Grossly normal  EKG not performed today  ASSESSMENT AND PLAN:   Left ventricular dysfunction 2D echo performed during her hospitalization in April revealed an EF of 30 to 35% with mild to moderate MR and TR.  Her blood pressure would not allow treatment with beta-blocker and at the time she had renal  insufficiency which precluded use of ARB/ACE.  She currently denies chest pain or shortness of breath.  I am going to repeat a 2D echo to see if her LV function improved.  Family history of heart disease Father had a myocardial infarction at age 79.  Hyperlipidemia History of hyperlipidemia with lipid profile revealing total cholesterol 236 with LDL 139 HDL 57.  She is currently not on statin therapy.     Lorretta Harp MD FACP,FACC,FAHA, [facility] 01/25/2022 2:30 PM

## 2022-02-07 DIAGNOSIS — J45909 Unspecified asthma, uncomplicated: Secondary | ICD-10-CM | POA: Diagnosis not present

## 2022-02-07 DIAGNOSIS — E785 Hyperlipidemia, unspecified: Secondary | ICD-10-CM | POA: Diagnosis not present

## 2022-02-07 DIAGNOSIS — I5041 Acute combined systolic (congestive) and diastolic (congestive) heart failure: Secondary | ICD-10-CM | POA: Diagnosis not present

## 2022-02-07 DIAGNOSIS — K219 Gastro-esophageal reflux disease without esophagitis: Secondary | ICD-10-CM | POA: Diagnosis not present

## 2022-02-08 DIAGNOSIS — M47816 Spondylosis without myelopathy or radiculopathy, lumbar region: Secondary | ICD-10-CM | POA: Diagnosis not present

## 2022-02-11 ENCOUNTER — Ambulatory Visit (HOSPITAL_COMMUNITY): Payer: Medicare Other | Attending: Cardiovascular Disease

## 2022-02-11 DIAGNOSIS — Z8249 Family history of ischemic heart disease and other diseases of the circulatory system: Secondary | ICD-10-CM | POA: Diagnosis not present

## 2022-02-11 DIAGNOSIS — I519 Heart disease, unspecified: Secondary | ICD-10-CM

## 2022-02-11 DIAGNOSIS — E782 Mixed hyperlipidemia: Secondary | ICD-10-CM | POA: Diagnosis not present

## 2022-02-11 DIAGNOSIS — I5042 Chronic combined systolic (congestive) and diastolic (congestive) heart failure: Secondary | ICD-10-CM | POA: Insufficient documentation

## 2022-02-11 LAB — ECHOCARDIOGRAM COMPLETE
Area-P 1/2: 2.78 cm2
S' Lateral: 3.4 cm

## 2022-02-11 MED ORDER — PERFLUTREN LIPID MICROSPHERE
1.0000 mL | INTRAVENOUS | Status: AC | PRN
  Administered 2022-02-11: 2 mL via INTRAVENOUS

## 2022-03-14 ENCOUNTER — Encounter: Payer: Self-pay | Admitting: Infectious Diseases

## 2022-03-14 ENCOUNTER — Other Ambulatory Visit: Payer: Self-pay

## 2022-03-14 ENCOUNTER — Ambulatory Visit (INDEPENDENT_AMBULATORY_CARE_PROVIDER_SITE_OTHER): Payer: Medicare Other | Admitting: Infectious Diseases

## 2022-03-14 VITALS — BP 112/63 | HR 71 | Temp 98.1°F | Wt 202.0 lb

## 2022-03-14 DIAGNOSIS — Z1331 Encounter for screening for depression: Secondary | ICD-10-CM | POA: Diagnosis not present

## 2022-03-14 DIAGNOSIS — I5042 Chronic combined systolic (congestive) and diastolic (congestive) heart failure: Secondary | ICD-10-CM | POA: Diagnosis not present

## 2022-03-14 DIAGNOSIS — G4733 Obstructive sleep apnea (adult) (pediatric): Secondary | ICD-10-CM | POA: Diagnosis not present

## 2022-03-14 DIAGNOSIS — Z0000 Encounter for general adult medical examination without abnormal findings: Secondary | ICD-10-CM | POA: Diagnosis not present

## 2022-03-14 DIAGNOSIS — Z23 Encounter for immunization: Secondary | ICD-10-CM | POA: Diagnosis not present

## 2022-03-14 DIAGNOSIS — E7800 Pure hypercholesterolemia, unspecified: Secondary | ICD-10-CM | POA: Diagnosis not present

## 2022-03-14 DIAGNOSIS — I700 Atherosclerosis of aorta: Secondary | ICD-10-CM | POA: Diagnosis not present

## 2022-03-14 DIAGNOSIS — Z5181 Encounter for therapeutic drug level monitoring: Secondary | ICD-10-CM | POA: Diagnosis not present

## 2022-03-14 DIAGNOSIS — T847XXD Infection and inflammatory reaction due to other internal orthopedic prosthetic devices, implants and grafts, subsequent encounter: Secondary | ICD-10-CM

## 2022-03-14 DIAGNOSIS — Z86711 Personal history of pulmonary embolism: Secondary | ICD-10-CM | POA: Diagnosis not present

## 2022-03-14 MED ORDER — CEFADROXIL 500 MG PO CAPS
1000.0000 mg | ORAL_CAPSULE | Freq: Two times a day (BID) | ORAL | 1 refills | Status: DC
Start: 2022-03-14 — End: 2022-07-12

## 2022-03-14 NOTE — Addendum Note (Signed)
Addended by: Rosiland Oz on: 03/14/2022 12:14 PM   Modules accepted: Orders

## 2022-03-14 NOTE — Progress Notes (Signed)
Patient Active Problem List   Diagnosis Date Noted   Hyperlipidemia 01/25/2022   Family history of heart disease 01/25/2022   Left ventricular dysfunction 01/25/2022   PICC (peripherally inserted central catheter) removal 19/14/7829   Hardware complicating wound infection ([place]) 10/08/2021   Open wound of lumbar region 10/08/2021   Medication monitoring encounter 10/08/2021   Wound infection after surgery 09/10/2021   Acute combined systolic and diastolic congestive heart failure ([place]) 08/24/2021   Bacteremia    Hyponatremia    Shock ([place]) 08/21/2021   Acute respiratory failure with hypoxia (HCC)    AKI (acute kidney injury) ([place])    Nausea    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   OA (osteoarthritis) of knee 12/06/2019   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/2019   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 18 g 12   Ascorbic Acid (VITAMIN C) 1000 MG tablet Take 1,000 mg by mouth daily.     CALCIUM-VITAMIN D PO Take 1 tablet by mouth in the morning and at bedtime.     cefadroxil (DURICEF) 500 MG capsule Take 2 capsules (1,000 mg total) by mouth 2 (two) times daily. 120 capsule 5   Cholecalciferol (VITAMIN D) 125 MCG (5000 UT) CAPS Take 5,000 Units by mouth daily.     desvenlafaxine (PRISTIQ) 50 MG 24 hr tablet desvenlafaxine succinate ER 50 mg tablet,extended release 24 hr     Esomeprazole  Magnesium (NEXIUM 24HR PO) Take 22.3 mg by mouth in the morning and at bedtime.     Fluticasone-Umeclidin-Vilant (TRELEGY ELLIPTA) 100-62.5-25 MCG/ACT AEPB INHALE 1 PUFF ONCE DAILY (Patient taking differently: Inhale 1 puff into the lungs daily.) 60 each 11   methocarbamol (ROBAXIN) 500 MG tablet Take 1 tablet (500 mg total) by mouth every 6 (six) hours as needed for muscle spasms. 30 tablet 2   Multiple Vitamin (MULTIVITAMIN WITH MINERALS) TABS tablet Take 1 tablet by mouth daily.     ondansetron (ZOFRAN) 4 MG tablet Take 1 tablet (4 mg total) by mouth every 6 (six) hours as needed for nausea or vomiting. 20 tablet 2   oxyCODONE-acetaminophen (PERCOCET/ROXICET) 5-325 MG tablet Take 1-2 tablets by mouth every 4 (four) hours as needed for moderate pain or severe pain. (Patient taking differently: Take 1-2 tablets by mouth every 4 (four) hours as needed for moderate pain.) 30 tablet 0   pregabalin (LYRICA) 100 MG capsule Take 100-200 mg by mouth See admin instructions. Take 100 mg by mouth in the morning and 200 mg by mouth in the evening     Propylene Glycol (SYSTANE BALANCE OP) Place 1 drop into both eyes in the morning and at bedtime.     rivaroxaban (XARELTO) 20 MG TABS tablet Take 20 mg by mouth daily with supper.     zinc gluconate 50 MG tablet Take 50 mg by mouth daily.     cholestyramine (QUESTRAN) 4 g packet Take 4 g by mouth daily. (Patient  not taking: Reported on 01/25/2022)     No current facility-administered medications on file prior to visit.   Subjective: Here for HFU for post surgical deep lumbar wound infection involving the hardware. She is accompanied by her husband. Completed IV cefazolin through PICC on 6/5. She has started taking PO cefadroxil following completion of IV abtx. Denies missing doses or any issues with antibiotics like nausea, vomiting, rashes or diarrhea. Her lumbar back wound has healed with no drainage. She has not been following with wound care. She has an upcoming  appointment with neurosurgery in Dec 2023. She is very happy that her wound has healed well. Back pain is very minimal if any and takes robaxin and percocet  as needed.   Review of Systems: all systems reviewed with pertinent positives and negatives as listed above  Past Medical History:  Diagnosis Date   Arthritis    osteo   Asthma    Asthma    Basal cell carcinoma    face   COPD (chronic obstructive pulmonary disease) ([place])    COVID    2022   GERD (gastroesophageal reflux disease)    Heart palpitations    History of hiatal hernia 1991   History of iron deficiency anemia    History of migraine    while taking birth control pills   Hypercholesteremia    Neuropathy    left greater than right feet   Obesity    Obstructive sleep apnea 07/09/2012   CPAP    Peripheral neuropathy    Pneumonia    as a child   PONV (postoperative nausea and vomiting)    not since 1967   Pulmonary embolism ([place])    Shortness of breath    with exertion   Stroke ([place])    TIA (transient ischemic attack) 2014   TIA (transient ischemic attack) 2014   no residual - no follow up needed   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   Past Surgical History:  Procedure Laterality Date   APPLICATION OF ROBOTIC ASSISTANCE FOR SPINAL PROCEDURE N/A 08/07/2021   Procedure: APPLICATION OF ROBOTIC ASSISTANCE FOR SPINAL PROCEDURE;  Surgeon: Kristeen Miss, MD;  Location: [place];  Service: Neurosurgery;  Laterality: N/A;   BREAST BIOPSY Right    BREAST CYST EXCISION Left    CARDIAC CATHETERIZATION  07/09/2012   cataracts Bilateral 2013   this was second one   CHOLECYSTECTOMY N/A 02/18/2017   Procedure: LAPAROSCOPIC CHOLECYSTECTOMY WITH INTRAOPERATIVE CHOLANGIOGRAM;  Surgeon: Erroll Luna, MD;  Location: [facility];  Service: General;  Laterality: N/A;   COLONOSCOPY     DILATION AND CURETTAGE OF UTERUS     EYE SURGERY Bilateral 2014   cataract removal 2013 and 2014   FINGER SURGERY Right 1971   ring finger repair    KNEE SURGERY Right    arthroscopy   LEFT HEART CATHETERIZATION WITH CORONARY ANGIOGRAM N/A 07/09/2012   Procedure: LEFT HEART CATHETERIZATION WITH CORONARY ANGIOGRAM;  Surgeon: Sinclair Grooms, MD;  [facility];  Service: Cardiovascular;  Laterality: N/A;   LUMBAR FUSION     L3-S1   LUMBAR WOUND DEBRIDEMENT N/A 09/10/2021   Procedure: LUMBAR WOUND DEBRIDEMENT;  Surgeon: Kristeen Miss, MD;  Location: [place];  Service: Neurosurgery;  Laterality: N/A;   TONSILLECTOMY     TOTAL KNEE ARTHROPLASTY Right 12/06/2019   Procedure: TOTAL KNEE ARTHROPLASTY;  Surgeon: Gaynelle Arabian, MD;  Location: [facility];  Service: Orthopedics;  Laterality: Right;  27mn   TUBAL LIGATION  Social History   Tobacco Use   Smoking status: Never   Smokeless tobacco: Never   Tobacco comments:    Had tried when younger  Vaping Use   Vaping Use: Never used  Substance Use Topics   Alcohol use: No   Drug use: No    Family History  Problem Relation Age of Onset   Diabetes Mellitus II Mother    CVA Father    Heart attack Father    Lung cancer Father    Tuberculosis Sister        bovine    Allergies  Allergen Reactions   Sulfa Antibiotics Other (See Comments) and Palpitations   Actifed [Triprolidine-Pse] Palpitations   Lipitor [Atorvastatin] Other (See Comments)    Aches and pains   Triprolidine-Pseudoephedrine     Other reaction(s): racing heart   Bupropion Hcl Itching and Rash    Health Maintenance  Topic Date Due   TETANUS/TDAP  Never done   Zoster Vaccines- Shingrix (1 of 2) Never done   DEXA SCAN  Never done   COVID-19 Vaccine (7 - Mixed Product series) 07/30/2020   INFLUENZA VACCINE  12/18/2021   Medicare Annual Wellness (AWV)  04/11/2022   Pneumonia Vaccine 64+ Years old  Completed   HPV VACCINES  Aged Out    Objective: BP 112/63   Pulse 71   Temp 98.1 F (36.7 C) (Bladder)   Wt 202 lb (91.6 kg)   SpO2 96%   BMI 34.67 kg/m    Physical Exam Constitutional:       Appearance: Normal appearance. Obese  HENT:     Head: Normocephalic and atraumatic.      Mouth: Mucous membranes are moist.  Eyes:    Conjunctiva/sclera: Conjunctivae normal.     Pupils:   Cardiovascular:     Rate and Rhythm: Normal rate and regular rhythm.     Heart sounds:   Pulmonary:     Effort: Pulmonary effort is normal.     Breath sounds: Normal breath sounds.   Abdominal:     General: Non distended     Palpations: soft.   Musculoskeletal:        General: Normal range of motion.   Back Lumbar surgical wound has closed with no active drainage or signs of surrounding infection      Skin:    General: Skin is warm and dry.     Comments:  Neurological:     General: grossly non focal     Mental Status: awake, alert and oriented to person, place, and time.   Psychiatric:        Mood and Affect: Mood normal.   Lab Results Lab Results  Component Value Date   WBC 6.6 11/27/2021   HGB 11.2 (L) 11/27/2021   HCT 34.7 (L) 11/27/2021   MCV 84.4 11/27/2021   PLT 230 11/27/2021    Lab Results  Component Value Date   CREATININE 1.00 11/27/2021   BUN 16 11/27/2021   NA 139 11/27/2021   K 3.8 11/27/2021   CL 105 11/27/2021   CO2 27 11/27/2021    Lab Results  Component Value Date   ALT 28 08/25/2021   AST 17 08/25/2021   ALKPHOS 234 (H) 08/25/2021   BILITOT 1.1 08/25/2021    No results found for: "CHOL", "HDL", "LDLCALC", "LDLDIRECT", "TRIG", "CHOLHDL" No results found for: "LABRPR", "RPRTITER" No results found for: "HIV1RNAQUANT", "HIV1RNAVL", "CD4TABS"   Assessment/Plan # Post surgical Deep lumbar wound infection involving hardware:  in  the setting of surgical decompression and instrumented stabilization from L2 to sacrum  including revision of a previous surgery with pseudoarthrosis of L5-S1l on 3/21 - S/p debridement of lumbar wound 4/24 with OR cultures E coli. OR note with purulence in the sub cut space, also involving the hardware L5 and sacrum. Bone  graft in that area was also involved ( removed), wound vac placed.  - Completed 6 weeks of IV cefazolin on 10/22/21 followed by PO cefadroxil '1000mg'$  po bid from 6/6 due to + infected hardware - Labs today - Fu in 3 months  - Fu with Neurosurgery   # Medication Monitoring  - Labs today   I have personally spent 35 minutes involved in face-to-face and non-face-to-face activities for this patient on the day of the visit including counseling of the patient and coordination of care.   Wilber Oliphant, [facility] 03/14/2022, 11:32 AM

## 2022-03-15 ENCOUNTER — Telehealth: Payer: Self-pay

## 2022-03-15 LAB — BASIC METABOLIC PANEL
BUN/Creatinine Ratio: 18 (calc) (ref 6–22)
BUN: 19 mg/dL (ref 7–25)
CO2: 27 mmol/L (ref 20–32)
Calcium: 9.3 mg/dL (ref 8.6–10.4)
Chloride: 104 mmol/L (ref 98–110)
Creat: 1.03 mg/dL — ABNORMAL HIGH (ref 0.60–0.95)
Glucose, Bld: 71 mg/dL (ref 65–99)
Potassium: 4.4 mmol/L (ref 3.5–5.3)
Sodium: 140 mmol/L (ref 135–146)

## 2022-03-15 LAB — C-REACTIVE PROTEIN: CRP: 7.8 mg/L (ref <8.0)

## 2022-03-15 LAB — CBC
HCT: 37.2 % (ref 35.0–45.0)
Hemoglobin: 12.7 g/dL (ref 11.7–15.5)
MCH: 30 pg (ref 27.0–33.0)
MCHC: 34.1 g/dL (ref 32.0–36.0)
MCV: 87.9 fL (ref 80.0–100.0)
MPV: 11.3 fL (ref 7.5–12.5)
Platelets: 189 10*3/uL (ref 140–400)
RBC: 4.23 10*6/uL (ref 3.80–5.10)
RDW: 14.1 % (ref 11.0–15.0)
WBC: 6.2 10*3/uL (ref 3.8–10.8)

## 2022-03-15 LAB — SEDIMENTATION RATE: Sed Rate: 14 mm/h (ref 0–30)

## 2022-03-15 NOTE — Telephone Encounter (Signed)
-----   Message from Rosiland Oz, MD sent at 03/15/2022  8:59 AM EDT ----- Please let her know labs have no significant abnormality with normal inflammatory markers Very slight elevation in creatinine which will monitor for now.

## 2022-03-15 NOTE — Telephone Encounter (Signed)
Patient aware of results.  [place], CMA

## 2022-03-20 ENCOUNTER — Telehealth: Payer: Self-pay

## 2022-03-20 NOTE — Telephone Encounter (Signed)
Received a call from patient stating that since Sunday she is having diarrhea and vomiting and she thinks it's the antibiotic cefadroxil. Please advise.

## 2022-03-26 DIAGNOSIS — Z1231 Encounter for screening mammogram for malignant neoplasm of breast: Secondary | ICD-10-CM | POA: Diagnosis not present

## 2022-03-26 DIAGNOSIS — Z01419 Encounter for gynecological examination (general) (routine) without abnormal findings: Secondary | ICD-10-CM | POA: Diagnosis not present

## 2022-03-26 DIAGNOSIS — Z6836 Body mass index (BMI) 36.0-36.9, adult: Secondary | ICD-10-CM | POA: Diagnosis not present

## 2022-03-29 ENCOUNTER — Other Ambulatory Visit: Payer: Self-pay | Admitting: Neurological Surgery

## 2022-03-29 DIAGNOSIS — M47816 Spondylosis without myelopathy or radiculopathy, lumbar region: Secondary | ICD-10-CM

## 2022-04-05 ENCOUNTER — Ambulatory Visit
Admission: RE | Admit: 2022-04-05 | Discharge: 2022-04-05 | Disposition: A | Discharge status: Home / Self Care | Payer: Medicare Other | Source: Ambulatory Visit | Attending: Neurological Surgery | Admitting: Neurological Surgery

## 2022-04-05 DIAGNOSIS — M545 Low back pain, unspecified: Secondary | ICD-10-CM | POA: Diagnosis not present

## 2022-04-05 DIAGNOSIS — M47816 Spondylosis without myelopathy or radiculopathy, lumbar region: Secondary | ICD-10-CM

## 2022-04-08 DIAGNOSIS — H43391 Other vitreous opacities, right eye: Secondary | ICD-10-CM | POA: Diagnosis not present

## 2022-04-08 DIAGNOSIS — H16143 Punctate keratitis, bilateral: Secondary | ICD-10-CM | POA: Diagnosis not present

## 2022-04-08 DIAGNOSIS — H43811 Vitreous degeneration, right eye: Secondary | ICD-10-CM | POA: Diagnosis not present

## 2022-04-08 DIAGNOSIS — H35363 Drusen (degenerative) of macula, bilateral: Secondary | ICD-10-CM | POA: Diagnosis not present

## 2022-04-09 DIAGNOSIS — G4733 Obstructive sleep apnea (adult) (pediatric): Secondary | ICD-10-CM | POA: Diagnosis not present

## 2022-05-03 DIAGNOSIS — S32009K Unspecified fracture of unspecified lumbar vertebra, subsequent encounter for fracture with nonunion: Secondary | ICD-10-CM | POA: Diagnosis not present

## 2022-05-03 DIAGNOSIS — Z6835 Body mass index (BMI) 35.0-35.9, adult: Secondary | ICD-10-CM | POA: Diagnosis not present

## 2022-05-09 ENCOUNTER — Ambulatory Visit: Payer: Medicare Other | Admitting: Internal Medicine

## 2022-05-09 DIAGNOSIS — M79645 Pain in left finger(s): Secondary | ICD-10-CM | POA: Diagnosis not present

## 2022-05-09 DIAGNOSIS — M1812 Unilateral primary osteoarthritis of first carpometacarpal joint, left hand: Secondary | ICD-10-CM | POA: Diagnosis not present

## 2022-05-09 DIAGNOSIS — M25532 Pain in left wrist: Secondary | ICD-10-CM | POA: Diagnosis not present

## 2022-05-14 DIAGNOSIS — H353131 Nonexudative age-related macular degeneration, bilateral, early dry stage: Secondary | ICD-10-CM | POA: Diagnosis not present

## 2022-05-14 DIAGNOSIS — H16143 Punctate keratitis, bilateral: Secondary | ICD-10-CM | POA: Diagnosis not present

## 2022-06-09 NOTE — Progress Notes (Signed)
HPI F never smoker (+ second hand), retired Therapist, sports, with hx Asthma, Emphysema, Bronchiectasis (on CT), complicated by .Granulomatous Lung Disease, OSA (Dr Maxwell Caul), Polyneuropathy, Transient Global Amnesia, Hyperlipidemia, PFT [facility] 07/23/12- mild obstructive airways disease with response to bronchodilator. Normal lung volumes, normal diffusion. FVC 2.36/79%, FEV1 1.89/83%, FEV1/FVC 0.80, FEF 25-75% 1.31/69%. TLC 96%, DLCO 95%. CT angiogram chest chest [facility] 08/04/2012: Prior granulomatous disease. 5 mm none, supplied nodule right mid lung. Bilateral lower lobe bronchiectatic change and underlying emphysema. Hiatal hernia. Fatty liver. Quant AFB assay 10/15/12- NEG ------------------------------------------------------------------------------------------  05/07/21- 80 yoF never smoker (+ second hand) with hx Asthma, Emphysema (on CT), complicated by  Granulomatous Lung Disease, OSA (Dr Maxwell Caul), Polyneuropathy, Transient Global Amnesia/ CVA,, Hyperlipidemia, GERD,  Covid infection, Oct 2022,   - Flonase, Trelegy 100,  Ventolin hfa,  Covid vax- 3 Phizer Flu vax-had Reports COVID infection 6 to 8 weeks ago.  Has had worse wheezing since then with some dry cough and fatigue.  She got no antiviral therapy and was managed at home.  More frequent use of rescue inhaler does help.  06/11/22- 27 yoF never smoker (+ second hand) with hx Asthma, Emphysema (on CT), complicated by  Granulomatous Lung Disease, OSA/ CPAP (Dr Maxwell Caul), Polyneuropathy, Transient Global Amnesia/ CVA,, Hyperlipidemia, GERD, Pulmonary embolism 2023 Covid infection, Oct 2022,   - Flonase, Trelegy 100,  Xarelto, Ventolin hfa,  Covid vax- 4 Phizer, 2 Moderna Flu vax-had -----No current respiratory issues. Pt is doing well Finished Xarelto after pulmonary  embolism last year. Wearing a bone growth stimulator after back surgery. Using Trelegy inhaler and rarely needs her albuterol rescue inhaler now. She feels she is doing well with  little cough or wheeze. CXR 08/21/21 1V- IMPRESSION: 1. Concern for developing infiltrate or streaky atelectasis versus aspiration, left lower lobe infrahilar area. 2. Stable left IJ central line placement. 3. Cardiomegaly. 4. Aortic atherosclerosis.   ROS-see HPI   + = positive Constitutional:    weight loss, night sweats, fevers, chills, +fatigue, lassitude. HEENT:    headaches, +difficulty swallowing, tooth/dental problems, sore throat,       sneezing, itching, ear ache, nasal congestion, post nasal drip, snoring CV:    chest pain, orthopnea, PND, swelling in lower extremities, anasarca,                                   dizziness, palpitations Resp:   +shortness of breath with exertion or at rest.                productive cough,   non-productive cough, coughing up of blood.              change in color of mucus.  +wheezing.   Skin:    rash or lesions. GI:  No-   heartburn, indigestion, abdominal pain, nausea, vomiting, diarrhea,                 change in bowel habits, loss of appetite GU: dysuria, change in color of urine, no urgency or frequency.   flank pain. MS:  + joint pain, stiffness, decreased range of motion, back pain. Neuro-     nothing unusual Psych:  change in mood or affect.  depression or anxiety.   memory loss.  OBJ- Physical Exam General- Alert, Oriented, Affect-appropriate, Distress- none acute, + obese Skin- rash-none, lesions- none, excoriation- none Lymphadenopathy- none Head- atraumatic  Eyes- Gross vision intact, PERRLA, conjunctivae and secretions clear            Ears- Hearing, canals-normal            Nose- Clear, no-Septal dev, mucus, polyps, erosion, perforation             Throat- Mallampati II , mucosa clear , drainage- none, tonsils- atrophic Neck- flexible , trachea midline, no stridor , thyroid nl, carotid no bruit Chest - symmetrical excursion , unlabored           Heart/CV- RRR , no murmur , no gallop  , no rub, nl s1 s2                            - JVD- none , edema- none, stasis changes- none, varices- none           Lung- clear to P&A, wheeze- none, cough- none , dullness-none, rub- none           Chest wall-  Abd-  Br/ Gen/ Rectal- Not done, not indicated Extrem- cyanosis- none, clubbing, none, atrophy- none, strength- nl,  + superficial varices Neuro- grossly intact to observation       ROS-see HPI Constitutional:   No-   weight loss, night sweats, fevers, chills, fatigue, lassitude. HEENT:   No-  headaches, difficulty swallowing, tooth/dental problems, sore throat,       No-  sneezing, itching, ear ache, nasal congestion, post nasal drip,  CV:  No-   chest pain, orthopnea, PND, swelling in lower extremities, anasarca, dizziness, palpitations Resp: +  shortness of breath with exertion or at rest.              No-   productive cough,  No non-productive cough,  No- coughing up of blood.              No-   change in color of mucus.  No- wheezing.   Skin: No-   rash or lesions. GI:  No-   heartburn, indigestion, abdominal pain, nausea, vomiting, GU: . MS:  No-   joint pain or swelling.   Neuro-     nothing unusual Psych:  No- change in mood or affect. No depression or anxiety.  No memory loss.  OBJ- Physical Exam General- Alert, Oriented, Affect-appropriate, Distress- none acute. overweight Skin- rash-none, lesions- none, excoriation- none Lymphadenopathy- none Head- atraumatic            Eyes- Gross vision intact, PERRLA, conjunctivae and secretions clear            Ears- Hearing, canals-normal            Nose- Clear, no-Septal dev, mucus, polyps, erosion, perforation             Throat- Mallampati III , mucosa clear , drainage- none, tonsils- atrophic Neck- flexible , trachea midline, no stridor , thyroid nl, carotid no bruit Chest - symmetrical excursion , unlabored           Heart/CV- RRR , no murmur , no gallop  , no rub, nl s1 s2                           - JVD- none , edema- none, stasis changes-  none, varices- none           Lung-  clear, wheeze- none, cough- none , dullness-none, rub- none  Chest wall-  Abd-  Br/ Gen/ Rectal- Not done, not indicated Extrem- cyanosis- none, clubbing, none, atrophy- none, strength- nl Neuro- grossly intact to observation

## 2022-06-11 ENCOUNTER — Ambulatory Visit (INDEPENDENT_AMBULATORY_CARE_PROVIDER_SITE_OTHER): Payer: Medicare Other | Admitting: Internal Medicine

## 2022-06-11 ENCOUNTER — Encounter: Payer: Self-pay | Admitting: Internal Medicine

## 2022-06-11 VITALS — BP 120/66 | HR 70 | Ht 64.5 in | Wt 212.2 lb

## 2022-06-11 DIAGNOSIS — J8410 Pulmonary fibrosis, unspecified: Secondary | ICD-10-CM

## 2022-06-11 DIAGNOSIS — G4733 Obstructive sleep apnea (adult) (pediatric): Secondary | ICD-10-CM | POA: Diagnosis not present

## 2022-06-11 DIAGNOSIS — J45909 Unspecified asthma, uncomplicated: Secondary | ICD-10-CM

## 2022-06-11 NOTE — Assessment & Plan Note (Addendum)
We can continue current meds  Glad you are doing well- please call if we can help

## 2022-06-11 NOTE — Patient Instructions (Signed)
We can continue current meds  Glad you are doing well- please call if we can help 

## 2022-07-05 DIAGNOSIS — R051 Acute cough: Secondary | ICD-10-CM | POA: Diagnosis not present

## 2022-07-05 DIAGNOSIS — J45909 Unspecified asthma, uncomplicated: Secondary | ICD-10-CM | POA: Diagnosis not present

## 2022-07-05 DIAGNOSIS — J209 Acute bronchitis, unspecified: Secondary | ICD-10-CM | POA: Diagnosis not present

## 2022-07-05 DIAGNOSIS — J069 Acute upper respiratory infection, unspecified: Secondary | ICD-10-CM | POA: Diagnosis not present

## 2022-07-12 ENCOUNTER — Encounter: Payer: Self-pay | Admitting: Internal Medicine

## 2022-07-12 NOTE — Assessment & Plan Note (Signed)
Watch for progression but expect this to be burned-out.

## 2022-07-12 NOTE — Assessment & Plan Note (Signed)
Has been followed by [facility].

## 2022-07-15 ENCOUNTER — Ambulatory Visit: Payer: Medicare Other | Admitting: Infectious Diseases

## 2022-07-31 ENCOUNTER — Other Ambulatory Visit: Payer: Self-pay | Admitting: Neurological Surgery

## 2022-07-31 DIAGNOSIS — Z6835 Body mass index (BMI) 35.0-35.9, adult: Secondary | ICD-10-CM | POA: Diagnosis not present

## 2022-07-31 DIAGNOSIS — S32009K Unspecified fracture of unspecified lumbar vertebra, subsequent encounter for fracture with nonunion: Secondary | ICD-10-CM | POA: Diagnosis not present

## 2022-08-05 NOTE — Pre-Procedure Instructions (Signed)
Surgical Instructions    Your procedure is scheduled on August 08, 2022.  Report to [facility] Main Entrance "A" at 10:00 A.M., then check in with the Admitting office.  Call this number if you have problems the morning of surgery:  [phone]  If you have any questions prior to your surgery date call [phone]: Open Monday-Friday 8am-4pm If you experience any cold or flu symptoms such as cough, fever, chills, shortness of breath, etc. between now and your scheduled surgery, please notify us at the above number.     Remember:  Do not eat after midnight the night before your surgery  You may drink clear liquids until 9:00 AM the morning of your surgery.   Clear liquids allowed are: Water, Non-Citrus Juices (without pulp), Carbonated Beverages, Clear Tea, Black Coffee Only (NO MILK, CREAM OR POWDERED CREAMER of any kind), and Gatorade.     Take these medicines the morning of surgery with A SIP OF WATER:  esomeprazole (NEXIUM)   Fluticasone-Umeclidin-Vilant (TRELEGY ELLIPTA)   pregabalin (LYRICA)     May take these medicines IF NEEDED:  albuterol (VENTOLIN HFA) inhaler   LORazepam (ATIVAN)   methocarbamol (ROBAXIN)   oxyCODONE-acetaminophen (PERCOCET/ROXICET)    As of today, STOP taking any Aspirin (unless otherwise instructed by your surgeon) Aleve, Naproxen, Ibuprofen, Motrin, Advil, Goody's, BC's, all herbal medications, fish oil, and all vitamins. This includes your medication: diclofenac (VOLTAREN)                      Do NOT Smoke (Tobacco/Vaping) for 24 hours prior to your procedure.  If you use a CPAP at night, you may bring your mask/headgear for your overnight stay.   Contacts, glasses, piercing's, hearing aid's, dentures or partials may not be worn into surgery, please bring cases for these belongings.    For patients admitted to the hospital, discharge time will be determined by your treatment team.   Patients discharged the day of surgery will not be allowed  to drive home, and someone needs to stay with them for 24 hours.  SURGICAL WAITING ROOM VISITATION Patients having surgery or a procedure may have no more than 2 support people in the waiting area - these visitors may rotate.   Children under the age of 45 must have an adult with them who is not the patient. If the patient needs to stay at the hospital during part of their recovery, the visitor guidelines for inpatient rooms apply. Pre-op nurse will coordinate an appropriate time for 1 support person to accompany patient in pre-op.  This support person may not rotate.   Please refer to the [facility] website for the visitor guidelines for Inpatients (after your surgery is over and you are in a regular room).    Special instructions:   [place]- Preparing For Surgery  Before surgery, you can play an important role. Because skin is not sterile, your skin needs to be as free of germs as possible. You can reduce the number of germs on your skin by washing with CHG (chlorahexidine gluconate) Soap before surgery.  CHG is an antiseptic cleaner which kills germs and bonds with the skin to continue killing germs even after washing.    Oral Hygiene is also important to reduce your risk of infection.  Remember - BRUSH YOUR TEETH THE MORNING OF SURGERY WITH YOUR REGULAR TOOTHPASTE  Please do not use if you have an allergy to CHG or antibacterial soaps. If your skin becomes reddened/irritated stop  using the CHG.  Do not shave (including legs and underarms) for at least 48 hours prior to first CHG shower. It is OK to shave your face.  Please follow these instructions carefully.   Shower the NIGHT BEFORE SURGERY and the MORNING OF SURGERY  If you chose to wash your hair, wash your hair first as usual with your normal shampoo.  After you shampoo, rinse your hair and body thoroughly to remove the shampoo.  Use CHG Soap as you would any other liquid soap. You can apply CHG directly to the skin and wash  gently with a scrungie or a clean washcloth.   Apply the CHG Soap to your body ONLY FROM THE NECK DOWN.  Do not use on open wounds or open sores. Avoid contact with your eyes, ears, mouth and genitals (private parts). Wash Face and genitals (private parts)  with your normal soap.   Wash thoroughly, paying special attention to the area where your surgery will be performed.  Thoroughly rinse your body with warm water from the neck down.  DO NOT shower/wash with your normal soap after using and rinsing off the CHG Soap.  Pat yourself dry with a CLEAN TOWEL.  Wear CLEAN PAJAMAS to bed the night before surgery  Place CLEAN SHEETS on your bed the night before your surgery  DO NOT SLEEP WITH PETS.   Day of Surgery: Take a shower with CHG soap. Do not wear jewelry or makeup Do not wear lotions, powders, perfumes/colognes, or deodorant. Do not shave 48 hours prior to surgery.  Men may shave face and neck. Do not bring valuables to the hospital.  [facility] is not responsible for any belongings or valuables. Do not wear nail polish, gel polish, artificial nails, or any other type of covering on natural nails (fingers and toes) If you have artificial nails or gel coating that need to be removed by a nail salon, please have this removed prior to surgery. Artificial nails or gel coating may interfere with anesthesia's ability to adequately monitor your vital signs.  Wear Clean/Comfortable clothing the morning of surgery Remember to brush your teeth WITH YOUR REGULAR TOOTHPASTE.   Please read over the following fact sheets that you were given.    If you received a COVID test during your pre-op visit  it is requested that you wear a mask when out in public, stay away from anyone that may not be feeling well and notify your surgeon if you develop symptoms. If you have been in contact with anyone that has tested positive in the last 10 days please notify you surgeon.

## 2022-08-06 ENCOUNTER — Other Ambulatory Visit: Payer: Self-pay

## 2022-08-06 ENCOUNTER — Encounter (HOSPITAL_COMMUNITY)
Admission: RE | Admit: 2022-08-06 | Discharge: 2022-08-06 | Disposition: A | Discharge status: Home / Self Care | Payer: Medicare Other | Source: Ambulatory Visit | Attending: Neurological Surgery | Admitting: Neurological Surgery

## 2022-08-06 ENCOUNTER — Encounter (HOSPITAL_COMMUNITY): Payer: Self-pay

## 2022-08-06 VITALS — BP 115/56 | HR 72 | Temp 98.2°F | Resp 17 | Ht 64.5 in | Wt 212.3 lb

## 2022-08-06 DIAGNOSIS — M1990 Unspecified osteoarthritis, unspecified site: Secondary | ICD-10-CM | POA: Diagnosis present

## 2022-08-06 DIAGNOSIS — Z7951 Long term (current) use of inhaled steroids: Secondary | ICD-10-CM | POA: Diagnosis not present

## 2022-08-06 DIAGNOSIS — Z01818 Encounter for other preprocedural examination: Secondary | ICD-10-CM | POA: Insufficient documentation

## 2022-08-06 DIAGNOSIS — Z882 Allergy status to sulfonamides status: Secondary | ICD-10-CM | POA: Diagnosis not present

## 2022-08-06 DIAGNOSIS — Z86711 Personal history of pulmonary embolism: Secondary | ICD-10-CM | POA: Diagnosis not present

## 2022-08-06 DIAGNOSIS — Y838 Other surgical procedures as the cause of abnormal reaction of the patient, or of later complication, without mention of misadventure at the time of the procedure: Secondary | ICD-10-CM | POA: Diagnosis present

## 2022-08-06 DIAGNOSIS — Z8673 Personal history of transient ischemic attack (TIA), and cerebral infarction without residual deficits: Secondary | ICD-10-CM | POA: Diagnosis not present

## 2022-08-06 DIAGNOSIS — Z8249 Family history of ischemic heart disease and other diseases of the circulatory system: Secondary | ICD-10-CM | POA: Diagnosis not present

## 2022-08-06 DIAGNOSIS — Z823 Family history of stroke: Secondary | ICD-10-CM | POA: Diagnosis not present

## 2022-08-06 DIAGNOSIS — G629 Polyneuropathy, unspecified: Secondary | ICD-10-CM | POA: Diagnosis present

## 2022-08-06 DIAGNOSIS — R0600 Dyspnea, unspecified: Secondary | ICD-10-CM | POA: Insufficient documentation

## 2022-08-06 DIAGNOSIS — Z8616 Personal history of COVID-19: Secondary | ICD-10-CM | POA: Insufficient documentation

## 2022-08-06 DIAGNOSIS — J449 Chronic obstructive pulmonary disease, unspecified: Secondary | ICD-10-CM | POA: Diagnosis not present

## 2022-08-06 DIAGNOSIS — G4733 Obstructive sleep apnea (adult) (pediatric): Secondary | ICD-10-CM | POA: Diagnosis present

## 2022-08-06 DIAGNOSIS — Z96651 Presence of right artificial knee joint: Secondary | ICD-10-CM | POA: Diagnosis present

## 2022-08-06 DIAGNOSIS — I509 Heart failure, unspecified: Secondary | ICD-10-CM | POA: Diagnosis not present

## 2022-08-06 DIAGNOSIS — Y798 Miscellaneous orthopedic devices associated with adverse incidents, not elsewhere classified: Secondary | ICD-10-CM | POA: Diagnosis present

## 2022-08-06 DIAGNOSIS — Z888 Allergy status to other drugs, medicaments and biological substances status: Secondary | ICD-10-CM | POA: Diagnosis not present

## 2022-08-06 DIAGNOSIS — Z85828 Personal history of other malignant neoplasm of skin: Secondary | ICD-10-CM | POA: Diagnosis not present

## 2022-08-06 DIAGNOSIS — Z8701 Personal history of pneumonia (recurrent): Secondary | ICD-10-CM | POA: Diagnosis not present

## 2022-08-06 DIAGNOSIS — Z4789 Encounter for other orthopedic aftercare: Secondary | ICD-10-CM | POA: Diagnosis not present

## 2022-08-06 DIAGNOSIS — K219 Gastro-esophageal reflux disease without esophagitis: Secondary | ICD-10-CM | POA: Diagnosis present

## 2022-08-06 DIAGNOSIS — Z79899 Other long term (current) drug therapy: Secondary | ICD-10-CM | POA: Diagnosis not present

## 2022-08-06 DIAGNOSIS — E669 Obesity, unspecified: Secondary | ICD-10-CM | POA: Diagnosis present

## 2022-08-06 DIAGNOSIS — J4489 Other specified chronic obstructive pulmonary disease: Secondary | ICD-10-CM | POA: Diagnosis present

## 2022-08-06 DIAGNOSIS — H16223 Keratoconjunctivitis sicca, not specified as Sjogren's, bilateral: Secondary | ICD-10-CM | POA: Diagnosis not present

## 2022-08-06 DIAGNOSIS — M960 Pseudarthrosis after fusion or arthrodesis: Secondary | ICD-10-CM | POA: Diagnosis not present

## 2022-08-06 DIAGNOSIS — T8484XA Pain due to internal orthopedic prosthetic devices, implants and grafts, initial encounter: Secondary | ICD-10-CM | POA: Diagnosis not present

## 2022-08-06 DIAGNOSIS — I2510 Atherosclerotic heart disease of native coronary artery without angina pectoris: Secondary | ICD-10-CM

## 2022-08-06 DIAGNOSIS — G4730 Sleep apnea, unspecified: Secondary | ICD-10-CM | POA: Diagnosis not present

## 2022-08-06 DIAGNOSIS — E7800 Pure hypercholesterolemia, unspecified: Secondary | ICD-10-CM | POA: Diagnosis present

## 2022-08-06 DIAGNOSIS — M549 Dorsalgia, unspecified: Secondary | ICD-10-CM | POA: Diagnosis present

## 2022-08-06 DIAGNOSIS — Z6835 Body mass index (BMI) 35.0-35.9, adult: Secondary | ICD-10-CM | POA: Diagnosis not present

## 2022-08-06 LAB — BASIC METABOLIC PANEL
Anion gap: 11 (ref 5–15)
BUN: 18 mg/dL (ref 8–23)
CO2: 27 mmol/L (ref 22–32)
Calcium: 9.3 mg/dL (ref 8.9–10.3)
Chloride: 100 mmol/L (ref 98–111)
Creatinine, Ser: 1.06 mg/dL — ABNORMAL HIGH (ref 0.44–1.00)
GFR, Estimated: 53 mL/min — ABNORMAL LOW (ref >60)
Glucose, Bld: 103 mg/dL — ABNORMAL HIGH (ref 70–99)
Potassium: 4.2 mmol/L (ref 3.5–5.1)
Sodium: 138 mmol/L (ref 135–145)

## 2022-08-06 LAB — CBC
HCT: 39.5 % (ref 36.0–46.0)
Hemoglobin: 12.5 g/dL (ref 12.0–15.0)
MCH: 29 pg (ref 26.0–34.0)
MCHC: 31.6 g/dL (ref 30.0–36.0)
MCV: 91.6 fL (ref 80.0–100.0)
Platelets: 269 10*3/uL (ref 150–400)
RBC: 4.31 MIL/uL (ref 3.87–5.11)
RDW: 13.7 % (ref 11.5–15.5)
WBC: 10.1 10*3/uL (ref 4.0–10.5)
nRBC: 0 % (ref 0.0–0.2)

## 2022-08-06 LAB — SURGICAL PCR SCREEN
MRSA, PCR: NEGATIVE
Staphylococcus aureus: NEGATIVE

## 2022-08-06 NOTE — Progress Notes (Signed)
PCP - Dr. Seward Carol Cardiologist - Dr. Quay Burow  PPM/ICD - Denies Device Orders - n/a Rep Notified - n/a  Chest x-ray - n/a EKG - 10/16/2021 Stress Test - 07/09/2012 ECHO - 02/11/2022 (EF 45-50%) (08/21/2021 EF 30-35%) Cardiac Cath - 2014 - normal to per pt  Sleep Study - +OSA. Pt wears CPAP nightly. Pt does not know pressure setting.  Pt DM  Last dose of GLP1 agonist- n/a GLP1 instructions: n/a  Blood Thinner Instructions: n/a Aspirin Instructions: n/a  ERAS Protcol - Clear liquids until 0900 morning of surgery PRE-SURGERY Ensure or G2- n/a  COVID TEST- n/a   Anesthesia review: Yes. Discussed with Karoline Caldwell, PA-C. Admitted after last back surgery for multiple day with Sepsis, renal failure, and heart failure. Recent echocardiograms show improvement of EF, but not resolution.  Patient denies shortness of breath, fever, cough and chest pain at PAT appointment. Pt denies any respiratory illness/infection in the last two months.   All instructions explained to the patient, with a verbal understanding of the material. Patient agrees to go over the instructions while at home for a better understanding. Patient also instructed to self quarantine after being tested for COVID-19. The opportunity to ask questions was provided.

## 2022-08-07 ENCOUNTER — Telehealth: Payer: Self-pay | Admitting: *Deleted

## 2022-08-07 NOTE — Anesthesia Preprocedure Evaluation (Signed)
Anesthesia Evaluation  Patient identified by MRN, date of birth, ID band Patient awake    Reviewed: Allergy & Precautions, H&P , NPO status , Patient's Chart, lab work & pertinent test results  History of Anesthesia Complications (+) PONV and history of anesthetic complications  Airway Mallampati: II   Neck ROM: full    Dental   Pulmonary asthma , sleep apnea , COPD   breath sounds clear to auscultation       Cardiovascular +CHF   Rhythm:regular Rate:Normal     Neuro/Psych TIA Neuromuscular disease    GI/Hepatic hiatal hernia,GERD  ,,  Endo/Other    Renal/GU      Musculoskeletal  (+) Arthritis ,    Abdominal   Peds  Hematology   Anesthesia Other Findings   Reproductive/Obstetrics                             Anesthesia Physical Anesthesia Plan  ASA: 3  Anesthesia Plan: General   Post-op Pain Management:    Induction: Intravenous  PONV Risk Score and Plan: 4 or greater and Ondansetron, Dexamethasone and Treatment may vary due to age or medical condition  Airway Management Planned: Oral ETT  Additional Equipment:   Intra-op Plan:   Post-operative Plan: Extubation in OR  Informed Consent: I have reviewed the patients History and Physical, chart, labs and discussed the procedure including the risks, benefits and alternatives for the proposed anesthesia with the patient or authorized representative who has indicated his/her understanding and acceptance.     Dental advisory given  Plan Discussed with: CRNA, Anesthesiologist and Surgeon  Anesthesia Plan Comments: (PAT note by Karoline Caldwell, PA-C:  82 year old female with pertinent history that includes TIA (2014), OSA on CPAP, COPD, PE (08/2021), combined heart failure.    She underwent lumbar decompression surgery 08/07/21 and discharged the following day. Unfortunately, she returned to the ER 08/21/21 with weakness and SOB found  to have a PE and AKI - also E coli bacteremia felt related to UTI.  She was treated with eliquis. Mild wound dehiscence was observed and treated with wound care. Echocardiogram with LVEF 30-35%, grade 1 DD and global hypokinesis with mild LVH and mildly reduced RV function. Moderately elevated PASP, mild to moderate MR and mild to moderate TR. Cardiology was consulted. She has chronic dyspnea at baseline exacerbate by COVID 03/2021. GDMT included toprol (was subsequently discontinued due to hypotension even at low-dose). Renal function precluded use of ACEI/ARB/ARNI.  She ultimately required readmission 09/10/21 for surgical debridement found to have extensive infection requiring wound vac. Wound cultures and ID consulted for 6 weeks of IV ABX.   She last followed up with cardiologist Dr. Gwenlyn Found on 01/25/2022 and at that time was noted to be recovering well.  Chest pain or shortness of breath.  Repeat echo 02/11/2022 showed improvement in EF to 45 to 50%, grade 1 DD, normal RV function, no significant valve stenosis or regurgitation.  Recommended follow-up echo in 1 year.  Follows with pulmonology for history of asthma, OSA on CPAP, bronchiectasis (on CT), granulomatous lung disease.  Last seen by Dr. Annamaria Boots 06/11/2022, noted to be doing well at that time, continued on Trelegy Ellipta and as needed albuterol.  Preop labs reviewed, creatinine mildly elevated 1.06, otherwise WNL.  Reviewed history with anesthesiologist Dr. Fransisco Beau.  Advised patient to proceed as planned barring acute status change.  Will be evaluated day of surgery by assigned anesthesiologist.  EKG 10/16/2021: NSR.  Rate 81.  Low voltage QRS.  Cannot rule out anterior infarct, age undetermined.  TTE 02/11/2022: 1. Left ventricular ejection fraction, by estimation, is 45 to 50%. The  left ventricle has mildly decreased function. The left ventricle  demonstrates global hypokinesis. Left ventricular diastolic parameters are  consistent with Grade  I diastolic  dysfunction (impaired relaxation).  2. Right ventricular systolic function is normal. The right ventricular  size is normal.  3. The mitral valve is normal in structure. Trivial mitral valve  regurgitation. No evidence of mitral stenosis.  4. The aortic valve was not well visualized. There is mild calcification  of the aortic valve. There is mild thickening of the aortic valve. Aortic  valve regurgitation is not visualized.   Comparison(s): Prior images reviewed side by side. LVEF has improved, both  studies use contrast imaging.    )        Anesthesia Quick Evaluation

## 2022-08-07 NOTE — Telephone Encounter (Signed)
   Name: Christy Stephenson  DOB: 12-28-1940  MRN: [id number]  Primary Cardiologist: Quay Burow, MD   Preoperative team, please contact this patient and set up a phone call appointment for further preoperative risk assessment. Please obtain consent and complete medication review. Thank you for your help.  It appears patient is scheduled for surgery tomorrow. She has not been seen since September 2023.    Mayra Reel, NP 08/07/2022, 5:56 PM [place]

## 2022-08-07 NOTE — Progress Notes (Signed)
Anesthesia Chart Review:  82 year old female with pertinent history that includes TIA (2014), OSA on CPAP, COPD, PE (08/2021), combined heart failure.    She underwent lumbar decompression surgery 08/07/21 and discharged the following day. Unfortunately, she returned to the ER 08/21/21 with weakness and SOB found to have a PE and AKI - also E coli bacteremia felt related to UTI.  She was treated with eliquis. Mild wound dehiscence was observed and treated with wound care. Echocardiogram with LVEF 30-35%, grade 1 DD and global hypokinesis with mild LVH and mildly reduced RV function. Moderately elevated PASP, mild to moderate MR and mild to moderate TR. Cardiology was consulted. She has chronic dyspnea at baseline exacerbate by COVID 03/2021. GDMT included toprol (was subsequently discontinued due to hypotension even at low-dose). Renal function precluded use of ACEI/ARB/ARNI.  She ultimately required readmission 09/10/21 for surgical debridement found to have extensive infection requiring wound vac. Wound cultures and ID consulted for 6 weeks of IV ABX.   She last followed up with cardiologist Dr. Gwenlyn Found on 01/25/2022 and at that time was noted to be recovering well.  Chest pain or shortness of breath.  Repeat echo 02/11/2022 showed improvement in EF to 45 to 50%, grade 1 DD, normal RV function, no significant valve stenosis or regurgitation.  Recommended follow-up echo in 1 year.  Follows with pulmonology for history of asthma, OSA on CPAP, bronchiectasis (on CT), granulomatous lung disease.  Last seen by Dr. Annamaria Boots 06/11/2022, noted to be doing well at that time, continued on Trelegy Ellipta and as needed albuterol.  Preop labs reviewed, creatinine mildly elevated 1.06, otherwise WNL.  Reviewed history with anesthesiologist Dr. Fransisco Beau.  Advised patient to proceed as planned barring acute status change.  Will be evaluated day of surgery by assigned anesthesiologist.  EKG 10/16/2021: NSR.  Rate 81.  Low voltage  QRS.  Cannot rule out anterior infarct, age undetermined.  TTE 02/11/2022:  1. Left ventricular ejection fraction, by estimation, is 45 to 50%. The  left ventricle has mildly decreased function. The left ventricle  demonstrates global hypokinesis. Left ventricular diastolic parameters are  consistent with Grade I diastolic  dysfunction (impaired relaxation).   2. Right ventricular systolic function is normal. The right ventricular  size is normal.   3. The mitral valve is normal in structure. Trivial mitral valve  regurgitation. No evidence of mitral stenosis.   4. The aortic valve was not well visualized. There is mild calcification  of the aortic valve. There is mild thickening of the aortic valve. Aortic  valve regurgitation is not visualized.   Comparison(s): Prior images reviewed side by side. LVEF has improved, both  studies use contrast imaging.     Christy Stephenson [facility] Short Stay Center/Anesthesiology Phone [phone] 08/07/2022 4:15 PM

## 2022-08-07 NOTE — Telephone Encounter (Signed)
   [facility] Pre-operative Risk Assessment    Patient Name: Christy Stephenson  DOB: 05-10-1941 MRN: [id number]  HEARTCARE STAFF:  - IMPORTANT!!!!!! Under Visit Info/Reason for Call, type in Other and utilize the format Clearance MM/DD/YY or Clearance TBD. Do not use dashes or single digits. - Please review there is not already an duplicate clearance open for this procedure. - If request is for dental extraction, please clarify the # of teeth to be extracted. - If the patient is currently at the dentist's office, call Pre-Op Callback Staff (MA/nurse) to input urgent request.  - If the patient is not currently in the dentist office, please route to the Pre-Op pool.  Request for surgical clearance:  What type of surgery is being performed? Removal of S1, S2 AI screws  When is this surgery scheduled? 08/08/22  What type of clearance is required (medical clearance vs. Pharmacy clearance to hold med vs. Both)? Medical  Are there any medications that need to be held prior to surgery and how long? None listed  Practice name and name of physician performing surgery? [facility], Dr Ellene Route  What is the office phone number? [phone]   7.   What is the office fax number? [phone]  8.   Anesthesia type (None, local, MAC, general) ? General   Lia Hopping, Lashandra Arauz L 08/07/2022, 4:49 PM  _________________________________________________________________   (provider comments below)

## 2022-08-08 ENCOUNTER — Inpatient Hospital Stay (HOSPITAL_COMMUNITY): Payer: Medicare Other | Admitting: Physician Assistant

## 2022-08-08 ENCOUNTER — Inpatient Hospital Stay (HOSPITAL_COMMUNITY)
Admission: RE | Admit: 2022-08-08 | Discharge: 2022-08-09 | DRG: 516 | Disposition: A | Discharge status: Home / Self Care | Payer: Medicare Other | Attending: Neurological Surgery | Admitting: Neurological Surgery

## 2022-08-08 ENCOUNTER — Other Ambulatory Visit: Payer: Self-pay

## 2022-08-08 ENCOUNTER — Encounter (HOSPITAL_COMMUNITY): Payer: Self-pay | Admitting: Neurological Surgery

## 2022-08-08 ENCOUNTER — Inpatient Hospital Stay (HOSPITAL_COMMUNITY): Payer: Medicare Other

## 2022-08-08 ENCOUNTER — Inpatient Hospital Stay (HOSPITAL_BASED_OUTPATIENT_CLINIC_OR_DEPARTMENT_OTHER): Payer: Medicare Other | Admitting: Anesthesiology

## 2022-08-08 ENCOUNTER — Encounter (HOSPITAL_COMMUNITY)
Admission: RE | Disposition: A | Discharge status: Home / Self Care | Payer: Self-pay | Source: Home / Self Care | Attending: Neurological Surgery

## 2022-08-08 DIAGNOSIS — G4730 Sleep apnea, unspecified: Secondary | ICD-10-CM

## 2022-08-08 DIAGNOSIS — M1990 Unspecified osteoarthritis, unspecified site: Secondary | ICD-10-CM | POA: Diagnosis present

## 2022-08-08 DIAGNOSIS — J4489 Other specified chronic obstructive pulmonary disease: Secondary | ICD-10-CM | POA: Diagnosis present

## 2022-08-08 DIAGNOSIS — T8484XA Pain due to internal orthopedic prosthetic devices, implants and grafts, initial encounter: Secondary | ICD-10-CM | POA: Diagnosis not present

## 2022-08-08 DIAGNOSIS — M960 Pseudarthrosis after fusion or arthrodesis: Secondary | ICD-10-CM | POA: Diagnosis not present

## 2022-08-08 DIAGNOSIS — G629 Polyneuropathy, unspecified: Secondary | ICD-10-CM | POA: Diagnosis present

## 2022-08-08 DIAGNOSIS — Z79899 Other long term (current) drug therapy: Secondary | ICD-10-CM

## 2022-08-08 DIAGNOSIS — I509 Heart failure, unspecified: Secondary | ICD-10-CM | POA: Diagnosis not present

## 2022-08-08 DIAGNOSIS — Z85828 Personal history of other malignant neoplasm of skin: Secondary | ICD-10-CM

## 2022-08-08 DIAGNOSIS — Z6835 Body mass index (BMI) 35.0-35.9, adult: Secondary | ICD-10-CM

## 2022-08-08 DIAGNOSIS — Z8701 Personal history of pneumonia (recurrent): Secondary | ICD-10-CM

## 2022-08-08 DIAGNOSIS — G4733 Obstructive sleep apnea (adult) (pediatric): Secondary | ICD-10-CM | POA: Diagnosis present

## 2022-08-08 DIAGNOSIS — Z86711 Personal history of pulmonary embolism: Secondary | ICD-10-CM

## 2022-08-08 DIAGNOSIS — Z7951 Long term (current) use of inhaled steroids: Secondary | ICD-10-CM

## 2022-08-08 DIAGNOSIS — Y838 Other surgical procedures as the cause of abnormal reaction of the patient, or of later complication, without mention of misadventure at the time of the procedure: Secondary | ICD-10-CM | POA: Diagnosis present

## 2022-08-08 DIAGNOSIS — Z96651 Presence of right artificial knee joint: Secondary | ICD-10-CM | POA: Diagnosis present

## 2022-08-08 DIAGNOSIS — Z823 Family history of stroke: Secondary | ICD-10-CM

## 2022-08-08 DIAGNOSIS — K219 Gastro-esophageal reflux disease without esophagitis: Secondary | ICD-10-CM | POA: Diagnosis present

## 2022-08-08 DIAGNOSIS — Z4789 Encounter for other orthopedic aftercare: Secondary | ICD-10-CM | POA: Diagnosis not present

## 2022-08-08 DIAGNOSIS — J449 Chronic obstructive pulmonary disease, unspecified: Secondary | ICD-10-CM | POA: Diagnosis not present

## 2022-08-08 DIAGNOSIS — Z8673 Personal history of transient ischemic attack (TIA), and cerebral infarction without residual deficits: Secondary | ICD-10-CM

## 2022-08-08 DIAGNOSIS — E669 Obesity, unspecified: Secondary | ICD-10-CM | POA: Diagnosis present

## 2022-08-08 DIAGNOSIS — Y798 Miscellaneous orthopedic devices associated with adverse incidents, not elsewhere classified: Secondary | ICD-10-CM | POA: Diagnosis present

## 2022-08-08 DIAGNOSIS — Z8249 Family history of ischemic heart disease and other diseases of the circulatory system: Secondary | ICD-10-CM

## 2022-08-08 DIAGNOSIS — E7800 Pure hypercholesterolemia, unspecified: Secondary | ICD-10-CM | POA: Diagnosis present

## 2022-08-08 DIAGNOSIS — Z888 Allergy status to other drugs, medicaments and biological substances status: Secondary | ICD-10-CM

## 2022-08-08 DIAGNOSIS — Z882 Allergy status to sulfonamides status: Secondary | ICD-10-CM

## 2022-08-08 HISTORY — PX: HARDWARE REMOVAL: SHX979

## 2022-08-08 SURGERY — REMOVAL, HARDWARE
Anesthesia: General

## 2022-08-08 MED ORDER — ROCURONIUM BROMIDE 10 MG/ML (PF) SYRINGE
PREFILLED_SYRINGE | INTRAVENOUS | Status: AC
  Filled 2022-08-08: qty 50

## 2022-08-08 MED ORDER — PREGABALIN 100 MG PO CAPS
200.0000 mg | ORAL_CAPSULE | Freq: Every day | ORAL | Status: DC
  Administered 2022-08-08: 200 mg via ORAL
  Filled 2022-08-08: qty 2

## 2022-08-08 MED ORDER — CEFAZOLIN SODIUM-DEXTROSE 2-4 GM/100ML-% IV SOLN
2.0000 g | INTRAVENOUS | Status: AC
  Administered 2022-08-08: 2 g via INTRAVENOUS

## 2022-08-08 MED ORDER — OXYCODONE HCL 5 MG/5ML PO SOLN: 5.0000 mg | Freq: Once | ORAL | Status: AC | PRN

## 2022-08-08 MED ORDER — ACETAMINOPHEN 650 MG RE SUPP: 650.0000 mg | RECTAL | Status: DC | PRN

## 2022-08-08 MED ORDER — ACETAMINOPHEN 325 MG PO TABS: 650.0000 mg | ORAL_TABLET | ORAL | Status: DC | PRN

## 2022-08-08 MED ORDER — CHLORHEXIDINE GLUCONATE 0.12 % MT SOLN
OROMUCOSAL | Status: AC
  Administered 2022-08-08: 15 mL via OROMUCOSAL
  Filled 2022-08-08: qty 15

## 2022-08-08 MED ORDER — THROMBIN 5000 UNITS EX SOLR
CUTANEOUS | Status: AC
  Filled 2022-08-08: qty 10000

## 2022-08-08 MED ORDER — CEFAZOLIN SODIUM-DEXTROSE 2-4 GM/100ML-% IV SOLN
INTRAVENOUS | Status: AC
  Filled 2022-08-08: qty 100

## 2022-08-08 MED ORDER — DICLOFENAC SODIUM 75 MG PO TBEC
75.0000 mg | DELAYED_RELEASE_TABLET | Freq: Two times a day (BID) | ORAL | Status: DC
  Administered 2022-08-09: 75 mg via ORAL
  Filled 2022-08-08: qty 1

## 2022-08-08 MED ORDER — ALUM & MAG HYDROXIDE-SIMETH 200-200-20 MG/5ML PO SUSP: 30.0000 mL | Freq: Four times a day (QID) | ORAL | Status: DC | PRN

## 2022-08-08 MED ORDER — METHOCARBAMOL 500 MG PO TABS: 1000.0000 mg | ORAL_TABLET | Freq: Four times a day (QID) | ORAL | Status: DC | PRN

## 2022-08-08 MED ORDER — THROMBIN 5000 UNITS EX SOLR
OROMUCOSAL | Status: DC | PRN
  Administered 2022-08-08: 5 mL via TOPICAL

## 2022-08-08 MED ORDER — 0.9 % SODIUM CHLORIDE (POUR BTL) OPTIME
TOPICAL | Status: DC | PRN
  Administered 2022-08-08: 1000 mL

## 2022-08-08 MED ORDER — LIDOCAINE-EPINEPHRINE 1 %-1:100000 IJ SOLN
INTRAMUSCULAR | Status: DC | PRN
  Administered 2022-08-08: 10 mL

## 2022-08-08 MED ORDER — LIDOCAINE-EPINEPHRINE 1 %-1:100000 IJ SOLN
INTRAMUSCULAR | Status: AC
  Filled 2022-08-08: qty 1

## 2022-08-08 MED ORDER — ALBUTEROL SULFATE (2.5 MG/3ML) 0.083% IN NEBU: 3.0000 mL | INHALATION_SOLUTION | Freq: Four times a day (QID) | RESPIRATORY_TRACT | Status: DC | PRN

## 2022-08-08 MED ORDER — PANTOPRAZOLE SODIUM 40 MG PO TBEC
40.0000 mg | DELAYED_RELEASE_TABLET | Freq: Every day | ORAL | Status: DC
  Administered 2022-08-09: 40 mg via ORAL
  Filled 2022-08-08: qty 1

## 2022-08-08 MED ORDER — PREGABALIN 100 MG PO CAPS
100.0000 mg | ORAL_CAPSULE | Freq: Every day | ORAL | Status: DC
  Administered 2022-08-09: 100 mg via ORAL
  Filled 2022-08-08: qty 1

## 2022-08-08 MED ORDER — ROCURONIUM BROMIDE 10 MG/ML (PF) SYRINGE
PREFILLED_SYRINGE | INTRAVENOUS | Status: DC | PRN
  Administered 2022-08-08 (×2): 50 mg via INTRAVENOUS

## 2022-08-08 MED ORDER — FENTANYL CITRATE (PF) 250 MCG/5ML IJ SOLN
INTRAMUSCULAR | Status: AC
  Filled 2022-08-08: qty 5

## 2022-08-08 MED ORDER — OXYCODONE-ACETAMINOPHEN 5-325 MG PO TABS
1.0000 | ORAL_TABLET | Freq: Four times a day (QID) | ORAL | Status: DC | PRN
  Administered 2022-08-08 – 2022-08-09 (×5): 1 via ORAL
  Filled 2022-08-08 (×5): qty 1

## 2022-08-08 MED ORDER — EPHEDRINE 5 MG/ML INJ
INTRAVENOUS | Status: AC
  Filled 2022-08-08: qty 5

## 2022-08-08 MED ORDER — POLYETHYLENE GLYCOL 3350 17 G PO PACK: 17.0000 g | PACK | Freq: Every day | ORAL | Status: DC | PRN

## 2022-08-08 MED ORDER — VANCOMYCIN HCL 1000 MG IV SOLR
INTRAVENOUS | Status: AC
  Filled 2022-08-08: qty 20

## 2022-08-08 MED ORDER — FENTANYL CITRATE (PF) 250 MCG/5ML IJ SOLN
INTRAMUSCULAR | Status: DC | PRN
  Administered 2022-08-08: 100 ug via INTRAVENOUS

## 2022-08-08 MED ORDER — PHENOL 1.4 % MT LIQD: 1.0000 | OROMUCOSAL | Status: DC | PRN

## 2022-08-08 MED ORDER — OXYCODONE HCL 5 MG PO TABS
5.0000 mg | ORAL_TABLET | Freq: Once | ORAL | Status: AC | PRN
  Administered 2022-08-08: 5 mg via ORAL

## 2022-08-08 MED ORDER — FLUTICASONE FUROATE-VILANTEROL 100-25 MCG/ACT IN AEPB
1.0000 | INHALATION_SPRAY | Freq: Every day | RESPIRATORY_TRACT | Status: DC
  Filled 2022-08-08: qty 28

## 2022-08-08 MED ORDER — ONDANSETRON HCL 4 MG/2ML IJ SOLN: 4.0000 mg | Freq: Four times a day (QID) | INTRAMUSCULAR | Status: DC | PRN

## 2022-08-08 MED ORDER — SUGAMMADEX SODIUM 200 MG/2ML IV SOLN
INTRAVENOUS | Status: DC | PRN
  Administered 2022-08-08: 190.6 mg via INTRAVENOUS

## 2022-08-08 MED ORDER — DEXAMETHASONE SODIUM PHOSPHATE 10 MG/ML IJ SOLN
INTRAMUSCULAR | Status: DC | PRN
  Administered 2022-08-08: 10 mg via INTRAVENOUS

## 2022-08-08 MED ORDER — LORAZEPAM 0.5 MG PO TABS: 0.5000 mg | ORAL_TABLET | Freq: Three times a day (TID) | ORAL | Status: DC | PRN

## 2022-08-08 MED ORDER — PROPOFOL 10 MG/ML IV BOLUS
INTRAVENOUS | Status: DC | PRN
  Administered 2022-08-08: 100 mg via INTRAVENOUS

## 2022-08-08 MED ORDER — PROPOFOL 500 MG/50ML IV EMUL
INTRAVENOUS | Status: DC | PRN
  Administered 2022-08-08: 50 ug/kg/min via INTRAVENOUS

## 2022-08-08 MED ORDER — DEXAMETHASONE SODIUM PHOSPHATE 10 MG/ML IJ SOLN
INTRAMUSCULAR | Status: AC
  Filled 2022-08-08: qty 5

## 2022-08-08 MED ORDER — DOCUSATE SODIUM 100 MG PO CAPS
100.0000 mg | ORAL_CAPSULE | Freq: Two times a day (BID) | ORAL | Status: DC
  Administered 2022-08-08 – 2022-08-09 (×2): 100 mg via ORAL
  Filled 2022-08-08 (×2): qty 1

## 2022-08-08 MED ORDER — SODIUM CHLORIDE 0.9% FLUSH
3.0000 mL | Freq: Two times a day (BID) | INTRAVENOUS | Status: DC
  Administered 2022-08-08: 3 mL via INTRAVENOUS

## 2022-08-08 MED ORDER — OXYCODONE HCL 5 MG PO TABS
ORAL_TABLET | ORAL | Status: AC
  Filled 2022-08-08: qty 1

## 2022-08-08 MED ORDER — CHOLESTYRAMINE 4 G PO PACK: 4.0000 g | PACK | Freq: Every day | ORAL | Status: DC

## 2022-08-08 MED ORDER — CEFAZOLIN SODIUM-DEXTROSE 2-4 GM/100ML-% IV SOLN
2.0000 g | Freq: Three times a day (TID) | INTRAVENOUS | Status: AC
  Administered 2022-08-08 – 2022-08-09 (×2): 2 g via INTRAVENOUS
  Filled 2022-08-08 (×2): qty 100

## 2022-08-08 MED ORDER — LIDOCAINE 2% (20 MG/ML) 5 ML SYRINGE
INTRAMUSCULAR | Status: AC
  Filled 2022-08-08: qty 25

## 2022-08-08 MED ORDER — BUPIVACAINE HCL (PF) 0.5 % IJ SOLN
INTRAMUSCULAR | Status: DC | PRN
  Administered 2022-08-08 (×2): 10 mL

## 2022-08-08 MED ORDER — FLEET ENEMA 7-19 GM/118ML RE ENEM: 1.0000 | ENEMA | Freq: Once | RECTAL | Status: DC | PRN

## 2022-08-08 MED ORDER — SODIUM CHLORIDE 0.9% FLUSH: 3.0000 mL | INTRAVENOUS | Status: DC | PRN

## 2022-08-08 MED ORDER — CHLORHEXIDINE GLUCONATE CLOTH 2 % EX PADS: 6.0000 | MEDICATED_PAD | Freq: Once | CUTANEOUS | Status: DC

## 2022-08-08 MED ORDER — LACTATED RINGERS IV SOLN: INTRAVENOUS | Status: DC

## 2022-08-08 MED ORDER — CHLORHEXIDINE GLUCONATE 0.12 % MT SOLN: 15.0000 mL | Freq: Once | OROMUCOSAL | Status: AC

## 2022-08-08 MED ORDER — SENNA 8.6 MG PO TABS
1.0000 | ORAL_TABLET | Freq: Two times a day (BID) | ORAL | Status: DC
  Administered 2022-08-08 – 2022-08-09 (×2): 8.6 mg via ORAL
  Filled 2022-08-08 (×2): qty 1

## 2022-08-08 MED ORDER — METHOCARBAMOL 500 MG PO TABS
500.0000 mg | ORAL_TABLET | Freq: Four times a day (QID) | ORAL | Status: DC | PRN
  Administered 2022-08-08 – 2022-08-09 (×3): 500 mg via ORAL
  Filled 2022-08-08 (×3): qty 1

## 2022-08-08 MED ORDER — ONDANSETRON HCL 4 MG PO TABS: 4.0000 mg | ORAL_TABLET | Freq: Four times a day (QID) | ORAL | Status: DC | PRN

## 2022-08-08 MED ORDER — UMECLIDINIUM BROMIDE 62.5 MCG/ACT IN AEPB
1.0000 | INHALATION_SPRAY | Freq: Every day | RESPIRATORY_TRACT | Status: DC
  Filled 2022-08-08: qty 7

## 2022-08-08 MED ORDER — PHENYLEPHRINE HCL-NACL 20-0.9 MG/250ML-% IV SOLN
INTRAVENOUS | Status: DC | PRN
  Administered 2022-08-08: 25 ug/min via INTRAVENOUS

## 2022-08-08 MED ORDER — BISACODYL 10 MG RE SUPP: 10.0000 mg | Freq: Every day | RECTAL | Status: DC | PRN

## 2022-08-08 MED ORDER — PROPOFOL 10 MG/ML IV BOLUS
INTRAVENOUS | Status: AC
  Filled 2022-08-08: qty 20

## 2022-08-08 MED ORDER — MORPHINE SULFATE (PF) 2 MG/ML IV SOLN: 2.0000 mg | INTRAVENOUS | Status: DC | PRN

## 2022-08-08 MED ORDER — MENTHOL 3 MG MT LOZG: 1.0000 | LOZENGE | OROMUCOSAL | Status: DC | PRN

## 2022-08-08 MED ORDER — FENTANYL CITRATE (PF) 100 MCG/2ML IJ SOLN
INTRAMUSCULAR | Status: AC
  Filled 2022-08-08: qty 2

## 2022-08-08 MED ORDER — SODIUM CHLORIDE 0.9 % IV SOLN: 250.0000 mL | INTRAVENOUS | Status: DC

## 2022-08-08 MED ORDER — METHOCARBAMOL 1000 MG/10ML IJ SOLN: 500.0000 mg | Freq: Four times a day (QID) | INTRAVENOUS | Status: DC | PRN

## 2022-08-08 MED ORDER — LIDOCAINE 2% (20 MG/ML) 5 ML SYRINGE
INTRAMUSCULAR | Status: DC | PRN
  Administered 2022-08-08: 50 mg via INTRAVENOUS

## 2022-08-08 MED ORDER — ONDANSETRON HCL 4 MG/2ML IJ SOLN
INTRAMUSCULAR | Status: DC | PRN
  Administered 2022-08-08: 4 mg via INTRAVENOUS

## 2022-08-08 MED ORDER — ONDANSETRON HCL 4 MG/2ML IJ SOLN
INTRAMUSCULAR | Status: AC
  Filled 2022-08-08: qty 10

## 2022-08-08 MED ORDER — BACITRACIN ZINC 500 UNIT/GM EX OINT
TOPICAL_OINTMENT | CUTANEOUS | Status: DC | PRN
  Administered 2022-08-08: 1 via TOPICAL

## 2022-08-08 MED ORDER — ORAL CARE MOUTH RINSE: 15.0000 mL | Freq: Once | OROMUCOSAL | Status: AC

## 2022-08-08 MED ORDER — FENTANYL CITRATE (PF) 100 MCG/2ML IJ SOLN
25.0000 ug | INTRAMUSCULAR | Status: DC | PRN
  Administered 2022-08-08 (×4): 25 ug via INTRAVENOUS

## 2022-08-08 MED ORDER — PREGABALIN 100 MG PO CAPS: 100.0000 mg | ORAL_CAPSULE | ORAL | Status: DC

## 2022-08-08 MED ORDER — BACITRACIN ZINC 500 UNIT/GM EX OINT
TOPICAL_OINTMENT | CUTANEOUS | Status: AC
  Filled 2022-08-08: qty 28.35

## 2022-08-08 MED ORDER — PHENYLEPHRINE 80 MCG/ML (10ML) SYRINGE FOR IV PUSH (FOR BLOOD PRESSURE SUPPORT)
PREFILLED_SYRINGE | INTRAVENOUS | Status: AC
  Filled 2022-08-08: qty 10

## 2022-08-08 MED ORDER — BUPIVACAINE HCL (PF) 0.5 % IJ SOLN
INTRAMUSCULAR | Status: AC
  Filled 2022-08-08: qty 30

## 2022-08-08 SURGICAL SUPPLY — 51 items
APL SKNCLS STERI-STRIP NONHPOA (GAUZE/BANDAGES/DRESSINGS)
BAG COUNTER SPONGE SURGICOUNT (BAG) ×1 IMPLANT
BAG SPNG CNTER NS LX DISP (BAG) ×2
BENZOIN TINCTURE PRP APPL 2/3 (GAUZE/BANDAGES/DRESSINGS) IMPLANT
BLADE CLIPPER SURG (BLADE) IMPLANT
CANISTER SUCT 3000ML PPV (MISCELLANEOUS) ×1 IMPLANT
DRAPE C-ARM 42X72 X-RAY (DRAPES) IMPLANT
DRAPE LAPAROTOMY 100X72 PEDS (DRAPES) IMPLANT
DRAPE LAPAROTOMY 100X72X124 (DRAPES) IMPLANT
DURAPREP 26ML APPLICATOR (WOUND CARE) ×1 IMPLANT
ELECT BLADE 4.0 EZ CLEAN MEGAD (MISCELLANEOUS) ×1
ELECT REM PT RETURN 9FT ADLT (ELECTROSURGICAL) ×1
ELECTRODE BLDE 4.0 EZ CLN MEGD (MISCELLANEOUS) ×1 IMPLANT
ELECTRODE REM PT RTRN 9FT ADLT (ELECTROSURGICAL) ×1 IMPLANT
GAUZE 4X4 16PLY ~~LOC~~+RFID DBL (SPONGE) IMPLANT
GAUZE SPONGE 4X4 12PLY STRL (GAUZE/BANDAGES/DRESSINGS) ×1 IMPLANT
GLOVE BIOGEL PI IND STRL 7.0 (GLOVE) IMPLANT
GLOVE BIOGEL PI IND STRL 7.5 (GLOVE) IMPLANT
GLOVE BIOGEL PI IND STRL 8.5 (GLOVE) ×1 IMPLANT
GLOVE ECLIPSE 8.5 STRL (GLOVE) ×1 IMPLANT
GLOVE SURG SS PI 7.0 STRL IVOR (GLOVE) IMPLANT
GOWN STRL REUS W/ TWL LRG LVL3 (GOWN DISPOSABLE) ×1 IMPLANT
GOWN STRL REUS W/ TWL XL LVL3 (GOWN DISPOSABLE) ×1 IMPLANT
GOWN STRL REUS W/TWL 2XL LVL3 (GOWN DISPOSABLE) ×1 IMPLANT
GOWN STRL REUS W/TWL LRG LVL3 (GOWN DISPOSABLE) ×1
GOWN STRL REUS W/TWL XL LVL3 (GOWN DISPOSABLE) ×1
KIT BASIN OR (CUSTOM PROCEDURE TRAY) ×1 IMPLANT
KIT TURNOVER KIT B (KITS) ×1 IMPLANT
MARKER SKIN DUAL TIP RULER LAB (MISCELLANEOUS) ×1 IMPLANT
NEEDLE HYPO 22GX1.5 SAFETY (NEEDLE) ×1 IMPLANT
NS IRRIG 1000ML POUR BTL (IV SOLUTION) ×1 IMPLANT
PACK LAMINECTOMY NEURO (CUSTOM PROCEDURE TRAY) ×1 IMPLANT
PAD ARMBOARD 7.5X6 YLW CONV (MISCELLANEOUS) ×1 IMPLANT
RASP 3.0MM (RASP) IMPLANT
SOL ELECTROSURG ANTI STICK (MISCELLANEOUS) ×1
SOLUTION ELECTROSURG ANTI STCK (MISCELLANEOUS) ×1 IMPLANT
SPIKE FLUID TRANSFER (MISCELLANEOUS) ×1 IMPLANT
SPONGE SURGIFOAM ABS GEL SZ50 (HEMOSTASIS) ×1 IMPLANT
SPONGE T-LAP 4X18 ~~LOC~~+RFID (SPONGE) IMPLANT
STAPLER SKIN PROX WIDE 3.9 (STAPLE) IMPLANT
STRIP CLOSURE SKIN 1/2X4 (GAUZE/BANDAGES/DRESSINGS) IMPLANT
SUT VIC AB 0 CT1 18XCR BRD8 (SUTURE) ×1 IMPLANT
SUT VIC AB 0 CT1 8-18 (SUTURE) ×2
SUT VIC AB 2-0 CP2 18 (SUTURE) ×1 IMPLANT
SUT VIC AB 3-0 SH 8-18 (SUTURE) ×1 IMPLANT
SUT VIC AB 4-0 RB1 18 (SUTURE) ×1 IMPLANT
SWAB COLLECTION DEVICE MRSA (MISCELLANEOUS) IMPLANT
SWAB CULTURE ESWAB REG 1ML (MISCELLANEOUS) IMPLANT
TOWEL GREEN STERILE (TOWEL DISPOSABLE) ×1 IMPLANT
TOWEL GREEN STERILE FF (TOWEL DISPOSABLE) ×1 IMPLANT
WATER STERILE IRR 1000ML POUR (IV SOLUTION) ×1 IMPLANT

## 2022-08-08 NOTE — H&P (Signed)
Christy Stephenson is an 82 y.o. female.   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  She underwent revision little over a year ago.  She ended up having a severe wound infection and is gradually healed.  Nonetheless over that time she has persisted with the pseudoarthrosis and has developed looseness of the hardware at the S1 and pelvic levels.  After careful consideration of her options I have advised surgical removal of the hardware from the sacrum and S1.  Past Medical History:  Diagnosis Date   Arthritis    osteo   Asthma    Asthma    Basal cell carcinoma    face   COPD (chronic obstructive pulmonary disease) ([place])    COVID    2022   GERD (gastroesophageal reflux disease)    Heart palpitations    History of hiatal hernia 1991   History of iron deficiency anemia    History of migraine    while taking birth control pills   Hypercholesteremia    Neuropathy    left greater than right feet   Obesity    Obstructive sleep apnea 07/09/2012   CPAP    Peripheral neuropathy    Pneumonia    as a child   PONV (postoperative nausea and vomiting)    not since 1967   Pulmonary embolism ([facility])    Shortness of breath    with exertion   Stroke ([place]) 08/25/2012   TIA. 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    Past Surgical History:  Procedure Laterality Date   APPLICATION OF ROBOTIC ASSISTANCE FOR SPINAL PROCEDURE N/A 08/07/2021   Procedure: APPLICATION OF ROBOTIC ASSISTANCE FOR SPINAL PROCEDURE;  Surgeon: Kristeen Miss, MD;  Location: [facility];  Service: Neurosurgery;  Laterality: N/A;   BREAST BIOPSY Right    BREAST CYST EXCISION Left    CARDIAC CATHETERIZATION  07/09/2012   cataracts Bilateral 2013   this was second one    CHOLECYSTECTOMY N/A 02/18/2017   Procedure: LAPAROSCOPIC CHOLECYSTECTOMY WITH INTRAOPERATIVE CHOLANGIOGRAM;  Surgeon: Erroll Luna, MD;  Location: [facility];  Service: General;  Laterality: N/A;   COLONOSCOPY     DILATION AND CURETTAGE OF UTERUS     EYE SURGERY Bilateral 2014   cataract removal 2013 and 2014   FINGER SURGERY Right 1971   ring finger repair   KNEE SURGERY Right    arthroscopy   LEFT HEART CATHETERIZATION WITH CORONARY ANGIOGRAM N/A 07/09/2012   Procedure: LEFT HEART CATHETERIZATION WITH CORONARY ANGIOGRAM;  Surgeon: Sinclair Grooms, MD;  Location: [facility];  Service: Cardiovascular;  Laterality: N/A;   LUMBAR FUSION     L3-S1   LUMBAR WOUND DEBRIDEMENT N/A 09/10/2021   Procedure: LUMBAR WOUND DEBRIDEMENT;  Surgeon: Kristeen Miss, MD;  Location: [facility];  Service: Neurosurgery;  Laterality: N/A;   TONSILLECTOMY     TOTAL KNEE ARTHROPLASTY Right 12/06/2019   Procedure: TOTAL KNEE ARTHROPLASTY;  Surgeon: Gaynelle Arabian, MD;  Location: [facility];  Service: Orthopedics;  Laterality: Right;  24min   TUBAL LIGATION      Family History  Problem Relation Age of Onset   Diabetes Mellitus II Mother    CVA Father    Heart attack Father    Lung  cancer Father    Tuberculosis Sister        bovine   Social History:  reports that she has never smoked. She has never used smokeless tobacco. She reports that she does not drink alcohol and does not use drugs.  Allergies:  Allergies  Allergen Reactions   Sulfa Antibiotics Palpitations   Actifed [Triprolidine-Pse] Palpitations   Lipitor [Atorvastatin] Other (See Comments)    Aches and pains   Triprolidine-Pseudoephedrine     Other reaction(s): racing heart   Bupropion Hcl Itching and Rash    Medications Prior to Admission  Medication Sig Dispense Refill   albuterol (VENTOLIN HFA) 108 (90 Base) MCG/ACT inhaler Inhale 2 puffs into the lungs every 6 (six) hours as needed for wheezing or shortness of breath. 18 g 12   Ascorbic Acid  (VITAMIN C) 1000 MG tablet Take 1,000 mg by mouth daily.     CALCIUM-VITAMIN D PO Take 1 tablet by mouth in the morning and at bedtime.     Cholecalciferol (VITAMIN D) 125 MCG (5000 UT) CAPS Take 5,000 Units by mouth every evening.     diclofenac (VOLTAREN) 75 MG EC tablet Take 75 mg by mouth 2 (two) times daily.     esomeprazole (NEXIUM) 20 MG capsule Take 22.3 mg by mouth in the morning and at bedtime.     Fluticasone-Umeclidin-Vilant (TRELEGY ELLIPTA) 100-62.5-25 MCG/ACT AEPB INHALE 1 PUFF ONCE DAILY 60 each 11   lidocaine 4 % Place 1 patch onto the skin every 12 (twelve) hours.     MAGNESIUM GLYCINATE PO Take 500 mg by mouth every evening.     Melatonin 10 MG TABS Take 10 mg by mouth at bedtime.     methocarbamol (ROBAXIN) 500 MG tablet Take 1,000 mg by mouth every 6 (six) hours as needed for muscle spasms.     Multiple Vitamin (MULTIVITAMIN WITH MINERALS) TABS tablet Take 1 tablet by mouth daily.     oxyCODONE-acetaminophen (PERCOCET/ROXICET) 5-325 MG tablet Take 1-2 tablets by mouth every 6 (six) hours as needed for severe pain.     pregabalin (LYRICA) 100 MG capsule Take 100-200 mg by mouth See admin instructions. Take 100 mg by mouth in the morning and 200 mg by mouth in the evening     zinc gluconate 50 MG tablet Take 50 mg by mouth daily.     cholestyramine (QUESTRAN) 4 g packet Take 4 g by mouth daily.     LORazepam (ATIVAN) 0.5 MG tablet Take 0.5-1 mg by mouth every 8 (eight) hours as needed for anxiety.      No results found for this or any previous visit (from the past 48 hour(s)). No results found.  Review of Systems  Constitutional:  Positive for activity change.  Musculoskeletal:  Positive for back pain and gait problem.  All other systems reviewed and are negative.   Blood pressure (!) 144/63, pulse 67, temperature 97.7 F (36.5 C), temperature source Oral, resp. rate 18, height 5' 4.5" (1.638 m), weight 95.3 kg, SpO2 97 %. Physical Exam Constitutional:       Appearance: She is obese.  HENT:     Head: Normocephalic and atraumatic.     Right Ear: Tympanic membrane, ear canal and external ear normal.     Left Ear: Tympanic membrane, ear canal and external ear normal.     Mouth/Throat:     Mouth: Mucous membranes are moist.     Pharynx: Oropharynx is clear.  Eyes:     Extraocular Movements:  Extraocular movements intact.     Conjunctiva/sclera: Conjunctivae normal.     Pupils: Pupils are equal, round, and reactive to light.  Cardiovascular:     Rate and Rhythm: Normal rate and regular rhythm.  Pulmonary:     Effort: Pulmonary effort is normal.     Breath sounds: Normal breath sounds.  Abdominal:     General: Abdomen is flat. Bowel sounds are normal.     Palpations: Abdomen is soft.  Musculoskeletal:        General: Normal range of motion.     Cervical back: Normal range of motion and neck supple.  Skin:    General: Skin is warm.  Neurological:     General: No focal deficit present.     Mental Status: She is alert. Mental status is at baseline. She is disoriented.  Psychiatric:        Mood and Affect: Mood normal.        Behavior: Behavior normal.        Thought Content: Thought content normal.        Judgment: Judgment normal.      Assessment/Plan Pseudoarthrosis L5-S1.  Plan: Removal of hardware S1 and pelvis.  Earleen Newport, MD 08/08/2022, 12:10 PM

## 2022-08-08 NOTE — Telephone Encounter (Signed)
Patient and surgeon will assume the risks of surgery without cardiac clearance.   Unfortunately, surgical request was submitted too late for any telephone conversation with the patient.  I will also send this note to Dr. Gwenlyn Found to make him aware.  Elgie Collard, PA-C

## 2022-08-08 NOTE — Telephone Encounter (Signed)
Janett Billow, surgery scheduler for Dr. Ellene Route left a vm today calling back from my call earlier today. Per Janett Billow, since they had not heard back from cardiology yesterday, Dr. Ellene Route felt ol to proceed with surgery as pt is in a lot of pain. Per Jessica's vm anesthesia also felt ok to proceed and that they will d/w the pt today before surgery. I will update all parties involved in this matter.   Surgery still going forward w/o cardiac clearance.

## 2022-08-08 NOTE — Telephone Encounter (Signed)
I d/w the pre op APP this morning in regard to this clearance.Request was received yesterday for surgery today 08/08/22. Pt is needing a tele pre op appt per pre op APP. I reviewed the chart and saw that it looks to be that surgery is still planned for today as I do not see that it has been cancelled. I asked pre op APP did she want me to reach out to the surgeon's office to see if they are proceeding with the surgery today. Discretion will be up to the surgeon if they choose to proceed.   I left message for the surgery scheduler Jessica with Dr. Ellene Route office to call back and let us know if they are proceeding with surgery today though pt has not been given cardiac clearance yet at this time.   I will update all parties involved in this matter today.

## 2022-08-08 NOTE — Transfer of Care (Signed)
Immediate Anesthesia Transfer of Care Note  Patient: Christy Stephenson  Procedure(s) Performed: Removal of Sacral one1 and Sacral two AIar illiac screws  Patient Location: PACU  Anesthesia Type:General  Level of Consciousness: awake, alert , and oriented  Airway & Oxygen Therapy: Patient Spontanous Breathing  Post-op Assessment: Report given to RN and Post -op Vital signs reviewed and stable  Post vital signs: Reviewed and stable  Last Vitals:  Vitals Value Taken Time  BP 138/64 08/08/22 1400  Temp 98   Pulse 74 08/08/22 1403  Resp 13 08/08/22 1403  SpO2 92 % 08/08/22 1403  Vitals shown include unvalidated device data.  Last Pain:  Vitals:   08/08/22 1037  TempSrc:   PainSc: 0-No pain         Complications: No notable events documented.

## 2022-08-08 NOTE — Anesthesia Procedure Notes (Signed)
Procedure Name: Intubation Date/Time: 08/08/2022 12:30 PM  Performed by: Minerva Ends, CRNAPre-anesthesia Checklist: Patient identified, Emergency Drugs available, Suction available and Patient being monitored Patient Re-evaluated:Patient Re-evaluated prior to induction Oxygen Delivery Method: Circle system utilized Preoxygenation: Pre-oxygenation with 100% oxygen Induction Type: IV induction Ventilation: Mask ventilation without difficulty Laryngoscope Size: Mac and 3 Grade View: Grade I Tube type: Oral Tube size: 7.0 mm Number of attempts: 1 Airway Equipment and Method: Stylet and Oral airway Placement Confirmation: ETT inserted through vocal cords under direct vision, positive ETCO2 and breath sounds checked- equal and bilateral Secured at: 22 cm Tube secured with: Tape Dental Injury: Teeth and Oropharynx as per pre-operative assessment

## 2022-08-08 NOTE — Op Note (Signed)
Date of surgery: 08/08/2022 Preoperative diagnosis: Painful orthopedic hardware in sacrum and pelvis, pseudoarthrosis L5-S1 and pelvis Postoperative diagnosis: Same Procedure: Removal of painful orthopedic hardware S1 and pelvis Surgeon: Kristeen Miss Anesthesia: General endotracheal Indications: Christy Stephenson is an 82 year old individual who has had a revision arthrodesis for the second time of her lumbar spine with fixation to the pelvis.  This was done over a year ago however her surgery was complicated by wound infection she gradually healed this however she persisted with her pseudoarthrosis with loosening of the hardware at the S1 and S2 AI screws.  She notes that the pain has gotten progressively worse there is no sign of any active infection and she has normal white count normal sed rate her CRP has been modestly elevated.  She has been advised regarding removal of the hardware.  Procedure: Patient was brought to the operating room supine on the stretcher.  After the smooth induction of general endotracheal anesthesia, she was carefully turned prone.  The back was prepped with alcohol DuraPrep and marked at 2 weeks Boze the area of the hardware in the S2 AI and S1 screws.  From her previous infection there is a thick widened scar nascent skin and it was decided to excise this area and revise the scar to provide a Nityr closure of her lumbar spine.  Incision was made in elliptical fashion around the area of secondary healing and the skin here was excised.  The dissection was carried down to the lumbodorsal fascia and the deep subcutaneous tissue was released laterally to allow closure of the subcutaneous tissue and the skin.  Then the dissection was carried inferiorly to expose the region of the hardware.  The hardware was exposed and the S1 and S2 AI regions.  Proximal to the S1 screw the rod was exposed.  On both sides then we cut the rod using a metal cutting bit.  Ample irrigation was performed  along with blocking of the soft tissues to minimize the scatter of any shavings.  Once the rod was cut the screws were loosened and then the construct was removed from the S1 and S2 AI region.  Soft tissues were then cleansed using a rongeur to remove any bits of tissue with any metal shavings.  Once this was verified and completed the further subcutaneous dissection was performed to allow for closure the fascia was closed with #1 Vicryl in the deep layers and then 0 and 2-0 Vicryl was used to close the more subcutaneous layers and 3-0 and 4-0 Vicryl was used to close the subcuticular skin in line of surgical staples was placed to assure minimal tension on the edges of the skin.  Bacitracin ointment was then placed along with dry sponges and a dry dressing was applied.  Patient was returned to recovery room in stable condition with blood loss less than 50 cc.

## 2022-08-09 ENCOUNTER — Encounter (HOSPITAL_COMMUNITY): Payer: Self-pay | Admitting: Neurological Surgery

## 2022-08-09 DIAGNOSIS — Z86711 Personal history of pulmonary embolism: Secondary | ICD-10-CM | POA: Diagnosis not present

## 2022-08-09 DIAGNOSIS — M549 Dorsalgia, unspecified: Secondary | ICD-10-CM | POA: Diagnosis present

## 2022-08-09 DIAGNOSIS — Z823 Family history of stroke: Secondary | ICD-10-CM | POA: Diagnosis not present

## 2022-08-09 DIAGNOSIS — K219 Gastro-esophageal reflux disease without esophagitis: Secondary | ICD-10-CM | POA: Diagnosis present

## 2022-08-09 DIAGNOSIS — E7800 Pure hypercholesterolemia, unspecified: Secondary | ICD-10-CM | POA: Diagnosis present

## 2022-08-09 DIAGNOSIS — Z8673 Personal history of transient ischemic attack (TIA), and cerebral infarction without residual deficits: Secondary | ICD-10-CM | POA: Diagnosis not present

## 2022-08-09 DIAGNOSIS — G629 Polyneuropathy, unspecified: Secondary | ICD-10-CM | POA: Diagnosis present

## 2022-08-09 DIAGNOSIS — M1990 Unspecified osteoarthritis, unspecified site: Secondary | ICD-10-CM | POA: Diagnosis present

## 2022-08-09 DIAGNOSIS — J4489 Other specified chronic obstructive pulmonary disease: Secondary | ICD-10-CM | POA: Diagnosis present

## 2022-08-09 DIAGNOSIS — Z882 Allergy status to sulfonamides status: Secondary | ICD-10-CM | POA: Diagnosis not present

## 2022-08-09 DIAGNOSIS — M960 Pseudarthrosis after fusion or arthrodesis: Secondary | ICD-10-CM | POA: Diagnosis present

## 2022-08-09 DIAGNOSIS — G4733 Obstructive sleep apnea (adult) (pediatric): Secondary | ICD-10-CM | POA: Diagnosis present

## 2022-08-09 DIAGNOSIS — Z96651 Presence of right artificial knee joint: Secondary | ICD-10-CM | POA: Diagnosis present

## 2022-08-09 DIAGNOSIS — E669 Obesity, unspecified: Secondary | ICD-10-CM | POA: Diagnosis present

## 2022-08-09 DIAGNOSIS — T8484XA Pain due to internal orthopedic prosthetic devices, implants and grafts, initial encounter: Secondary | ICD-10-CM | POA: Diagnosis present

## 2022-08-09 DIAGNOSIS — Z79899 Other long term (current) drug therapy: Secondary | ICD-10-CM | POA: Diagnosis not present

## 2022-08-09 DIAGNOSIS — Z8249 Family history of ischemic heart disease and other diseases of the circulatory system: Secondary | ICD-10-CM | POA: Diagnosis not present

## 2022-08-09 DIAGNOSIS — Y838 Other surgical procedures as the cause of abnormal reaction of the patient, or of later complication, without mention of misadventure at the time of the procedure: Secondary | ICD-10-CM | POA: Diagnosis present

## 2022-08-09 DIAGNOSIS — Z85828 Personal history of other malignant neoplasm of skin: Secondary | ICD-10-CM | POA: Diagnosis not present

## 2022-08-09 DIAGNOSIS — Z8701 Personal history of pneumonia (recurrent): Secondary | ICD-10-CM | POA: Diagnosis not present

## 2022-08-09 DIAGNOSIS — Z7951 Long term (current) use of inhaled steroids: Secondary | ICD-10-CM | POA: Diagnosis not present

## 2022-08-09 DIAGNOSIS — Z6835 Body mass index (BMI) 35.0-35.9, adult: Secondary | ICD-10-CM | POA: Diagnosis not present

## 2022-08-09 DIAGNOSIS — Y798 Miscellaneous orthopedic devices associated with adverse incidents, not elsewhere classified: Secondary | ICD-10-CM | POA: Diagnosis present

## 2022-08-09 DIAGNOSIS — Z888 Allergy status to other drugs, medicaments and biological substances status: Secondary | ICD-10-CM | POA: Diagnosis not present

## 2022-08-09 MED ORDER — CEPHALEXIN 500 MG PO CAPS: 500.0000 mg | ORAL_CAPSULE | Freq: Three times a day (TID) | ORAL | 0 refills | Status: AC

## 2022-08-09 MED ORDER — OXYCODONE-ACETAMINOPHEN 5-325 MG PO TABS: 1.0000 | ORAL_TABLET | Freq: Four times a day (QID) | ORAL | 0 refills | Status: AC | PRN

## 2022-08-09 NOTE — Anesthesia Postprocedure Evaluation (Signed)
Anesthesia Post Note  Patient: Christy Stephenson  Procedure(s) Performed: Removal of Sacral one1 and Sacral two AIar illiac screws     Patient location during evaluation: PACU Anesthesia Type: General Level of consciousness: awake and alert Pain management: pain level controlled Vital Signs Assessment: post-procedure vital signs reviewed and stable Respiratory status: spontaneous breathing, nonlabored ventilation, respiratory function stable and patient connected to nasal cannula oxygen Cardiovascular status: blood pressure returned to baseline and stable Postop Assessment: no apparent nausea or vomiting Anesthetic complications: no   No notable events documented.  Last Vitals:  Vitals:   08/08/22 2322 08/09/22 0422  BP: 126/70 (!) 149/82  Pulse: 72 69  Resp: 20 20  Temp: 36.5 C 36.6 C  SpO2: 98% 98%    Last Pain:  Vitals:   08/09/22 0422  TempSrc: Oral  PainSc: 8                  [name]

## 2022-08-09 NOTE — Evaluation (Signed)
Occupational Therapy Evaluation and Discharge Summary Patient Details Name: Christy Stephenson MRN: [id number] DOB: September 05, 1940 Today's Date: 08/09/2022   History of Present Illness 82 yo female presenting 3/21 for removal of hardware from sacrum and S1. PMH includes: fusion of L3-S1 s/p revision ~1 year ago, R TKA, arthritis, COPD, HCL, neuropathy, OSA, obesity, PE, and multiple TIAs.   Clinical Impression   Pt admitted for above diagnosis. PTA, patient independent to Mod I in bADLs and only using RW to ambulate to manage pain with mobility. Patient currently completing room level ambulation, transfers, and lower body dressing independently but given supervision assist for safety. Pt functioning at or near baseline and reports her pain to be tolerable during functional activity. Pt has no further skilled acute OT needs at this time. No follow-up OT recommended for patient at this time.     Recommendations for follow up therapy are one component of a multi-disciplinary discharge planning process, led by the attending physician.  Recommendations may be updated based on patient status, additional functional criteria and insurance authorization.   Follow Up Recommendations  No OT follow up     Assistance Recommended at Discharge PRN  Patient can return home with the following      Functional Status Assessment  Patient has not had a recent decline in their functional status  Equipment Recommendations  None recommended by OT    Recommendations for Other Services       Precautions / Restrictions Precautions Precautions: Fall;Back Precaution Booklet Issued: No Precaution Comments: no brace or restrictions per MD Restrictions Weight Bearing Restrictions: No      Mobility Bed Mobility               General bed mobility comments: Pt seated EOB at beginning of OT session    Transfers Overall transfer level: Independent Equipment used: None               General transfer  comment: No balance impairments noted, Supervision assist provided for safety      Balance Overall balance assessment: No apparent balance deficits (not formally assessed)                                         ADL either performed or assessed with clinical judgement   ADL Overall ADL's : Modified independent;Independent                                       General ADL Comments: Patient completing functional ambulation, donning LEs with pants and socks, completing transfers independently     Vision         Perception     Praxis      Pertinent Vitals/Pain Pain Assessment Pain Assessment: 0-10 Pain Score: 3  Pain Location: Back, Patient reports it may go up to 8/10 during position changes but feels fine afterwards Pain Descriptors / Indicators: Aching Pain Intervention(s): Monitored during session, Limited activity within patient's tolerance     Hand Dominance Right   Extremity/Trunk Assessment Upper Extremity Assessment Upper Extremity Assessment: Overall WFL for tasks assessed   Lower Extremity Assessment Lower Extremity Assessment: Overall WFL for tasks assessed   Cervical / Trunk Assessment Cervical / Trunk Assessment: Back Surgery   Communication Communication Communication: No difficulties   Cognition Arousal/Alertness: Awake/alert Behavior During  Therapy: WFL for tasks assessed/performed Overall Cognitive Status: Within Functional Limits for tasks assessed                                       General Comments  VSS on RA    Exercises     Shoulder Instructions      Home Living Family/patient expects to be discharged to:: Private residence Living Arrangements: Spouse/significant other Available Help at Discharge: Family;Available 24 hours/day Type of Home: House Home Access: Stairs to enter [employer] of Steps: 20 from garage with stair lift Entrance Stairs-Rails: Left Home Layout:  One level     Bathroom Shower/Tub: Occupational psychologist: Standard Bathroom Accessibility: Yes How Accessible: Accessible via walker Home Equipment: [place] (2 wheels);Shower seat;BSC/3in1;Hand held shower head;Adaptive equipment;Wheelchair - Higher education careers adviser: Reacher;Long-handled sponge Additional Comments: pt using RW due to pain, has stair lift but doesnt use      Prior Functioning/Environment Prior Level of Function : Independent/Modified Independent             Mobility Comments: independent until recently due to pain, no DME typically. drives for herself and spouse ADLs Comments: independent, does IADLs for spouse        OT Problem List: Pain      OT Treatment/Interventions:      OT Goals(Current goals can be found in the care plan section) Acute Rehab OT Goals Patient Stated Goal: Go home OT Goal Formulation: With patient Time For Goal Achievement: 08/23/22 Potential to Achieve Goals: Good  OT Frequency:      Co-evaluation              AM-PAC OT "6 Clicks" Daily Activity     Outcome Measure Help from another person eating meals?: None Help from another person taking care of personal grooming?: None Help from another person toileting, which includes using toliet, bedpan, or urinal?: None Help from another person bathing (including washing, rinsing, drying)?: None Help from another person to put on and taking off regular upper body clothing?: None Help from another person to put on and taking off regular lower body clothing?: None 6 Click Score: 24   End of Session Nurse Communication: Mobility status  Activity Tolerance: Patient tolerated treatment well Patient left: in bed;with call bell/phone within reach  OT Visit Diagnosis: Pain Pain - part of body:  (back)                Time: [id number] OT Time Calculation (min): 8 min Charges:  OT General Charges $OT Visit: 1 Visit OT Evaluation $OT Eval Low Complexity: 1  Low  08/09/2022  AB, OTR/L  Acute Rehabilitation Services  Office: [phone]   [name] 08/09/2022, 10:04 AM

## 2022-08-09 NOTE — Progress Notes (Addendum)
Patient alert and oriented, voiding adequately, MAE well with no difficulty. Incision area cdi with no s/s of infection. Patient discharged home per order. Patient and husband stated understanding of discharge instructions given, and extra supplies given to husband for dressing change. Patient has an appointment with Dr. Ellene Route next week

## 2022-08-09 NOTE — Discharge Instructions (Signed)
Change dressing every day after shower.  Clean with Betadine and replace dressing Call MD if drainage continues

## 2022-08-09 NOTE — Evaluation (Signed)
Physical Therapy Evaluation Patient Details Name: Christy Stephenson MRN: [id number] DOB: 09-02-1940 Today's Date: 08/09/2022  History of Present Illness  82 yo female presenting 3/21 for removal of hardware from sacrum and S1. PMH includes: fusion of L3-S1 s/p revision ~1 year ago, R TKA, arthritis, COPD, HCL, neuropathy, OSA, obesity, PE, and multiple TIAs.   Clinical Impression  Pt in bed upon arrival of PT, agreeable to evaluation at this time. Prior to admission the pt was using a RW for pain control, but was otherwise independent, able to ambulate in community, climb stairs, and complete IADLs for herself and spouse without assist. The pt now presents with minor limitations in functional mobility, endurance, and dynamic stability, but was able to complete good distance of hallway ambulation, stairs, and sit-stand transfers without physical assist or DME. The pt was able to demo good understanding of mobility and safety, has all DME needed, and no further concerns regarding mobility and recovery at home. Safe to return home with family when medically cleared for d/c.  Gait Speed: 0.40m/s (Gait speed < 1.52m/s indicates increased risk of falls)       Recommendations for follow up therapy are one component of a multi-disciplinary discharge planning process, led by the attending physician.  Recommendations may be updated based on patient status, additional functional criteria and insurance authorization.  Follow Up Recommendations No PT follow up      Assistance Recommended at Discharge None  Patient can return home with the following       Equipment Recommendations None recommended by PT  Recommendations for Other Services       Functional Status Assessment Patient has not had a recent decline in their functional status     Precautions / Restrictions Precautions Precautions: Fall;Back Precaution Booklet Issued: No Precaution Comments: no brace or restrictions per  MD Restrictions Weight Bearing Restrictions: No      Mobility  Bed Mobility               General bed mobility comments: pt OOB at start and end of session    Transfers Overall transfer level: Independent Equipment used: None               General transfer comment: supervision for safety in session, no instability or assist    Ambulation/Gait Ambulation/Gait assistance: Supervision Gait Distance (Feet): 300 Feet Assistive device: None Gait Pattern/deviations: Step-through pattern Gait velocity: 0.71 m/s Gait velocity interpretation: 1.31 - 2.62 ft/sec, indicative of limited community ambulator   General Gait Details: intermittent drifting to L, no overt LOB.  Stairs Stairs: Yes Stairs assistance: Supervision Stair Management: One rail Left, Alternating pattern, Forwards Number of Stairs: 10 General stair comments: VSS, no focal weakness or buckling      Balance Overall balance assessment: Mild deficits observed, not formally tested                                           Pertinent Vitals/Pain Pain Assessment Pain Assessment: No/denies pain    Home Living Family/patient expects to be discharged to:: Private residence Living Arrangements: Spouse/significant other Available Help at Discharge: Family;Available 24 hours/day Type of Home: House Home Access: Stairs to enter Entrance Stairs-Rails: Left Entrance Stairs-Number of Steps: 20 from garage with stair lift   Home Layout: One level Home Equipment: Conservation officer, nature (2 wheels);Shower seat;BSC/3in1;Hand held shower head;Adaptive equipment;Wheelchair - manual Additional Comments:  pt using RW due to pain, has stair lift but doesnt use    Prior Function Prior Level of Function : Independent/Modified Independent             Mobility Comments: independent until recently due to pain, no DME typically. drives for herself and spouse ADLs Comments: independent, does IADLs for  spouse     Hand Dominance   Dominant Hand: Right    Extremity/Trunk Assessment   Upper Extremity Assessment Upper Extremity Assessment: Overall WFL for tasks assessed    Lower Extremity Assessment Lower Extremity Assessment: Overall WFL for tasks assessed    Cervical / Trunk Assessment Cervical / Trunk Assessment: Back Surgery  Communication   Communication: No difficulties  Cognition Arousal/Alertness: Awake/alert Behavior During Therapy: WFL for tasks assessed/performed Overall Cognitive Status: Within Functional Limits for tasks assessed                                          General Comments General comments (skin integrity, edema, etc.): VSS on RA    Exercises     Assessment/Plan    PT Assessment Patient does not need any further PT services         PT Goals (Current goals can be found in the Care Plan section)  Acute Rehab PT Goals Patient Stated Goal: return home PT Goal Formulation: All assessment and education complete, DC therapy Time For Goal Achievement: 08/23/22 Potential to Achieve Goals: Good     AM-PAC PT "6 Clicks" Mobility  Outcome Measure Help needed turning from your back to your side while in a flat bed without using bedrails?: None Help needed moving from lying on your back to sitting on the side of a flat bed without using bedrails?: None Help needed moving to and from a bed to a chair (including a wheelchair)?: None Help needed standing up from a chair using your arms (e.g., wheelchair or bedside chair)?: None Help needed to walk in hospital room?: A Little Help needed climbing 3-5 steps with a railing? : A Little 6 Click Score: 22    End of Session   Activity Tolerance: Patient tolerated treatment well Patient left: in bed Nurse Communication: Mobility status PT Visit Diagnosis: Other abnormalities of gait and mobility (R26.89);Pain Pain - part of body:  (back)    Time: 0810-0825 PT Time Calculation (min)  (ACUTE ONLY): 15 min   Charges:   PT Evaluation $PT Eval Low Complexity: 1 Low          West Carbo, PT, DPT   Acute Rehabilitation Department Office [phone] Secure Chat Communication Preferred  Christy Stephenson 08/09/2022, 9:34 AM

## 2022-08-09 NOTE — Discharge Summary (Signed)
Physician Discharge Summary  Patient ID: Christy Stephenson MRN: [id number] DOB/AGE: 12/14/1940 82 y.o.  Admit date: 08/08/2022 Discharge date: 08/09/2022  Admission Diagnoses: Pseudoarthrosis L5-S1.  Painful orthopedic hardware  Discharge Diagnoses: Pseudoarthrosis L5-S1.  Painful orthopedic hardware. Principal Problem:   Painful orthopaedic hardware [facility])   Discharged Condition: good  Hospital Course: Patient was admitted to undergo surgical removal of hardware placed at the sacrum and the pelvis.  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  The hardware is now being removed.  Consults: None  Significant Diagnostic Studies: None  Treatments: surgery: See op note  Discharge Exam: Blood pressure (!) 149/82, pulse 69, temperature 97.9 F (36.6 C), temperature source Oral, resp. rate 20, height 5' 4.5" (1.638 m), weight 95.3 kg, SpO2 98 %. Incision is clean and dry Station and gait are intact.  Disposition: Discharge disposition: 01-Home or Self Care       Discharge Instructions     Call MD for:  redness, tenderness, or signs of infection (pain, swelling, redness, odor or green/yellow discharge around incision site)   Complete by: As directed    Call MD for:  severe uncontrolled pain   Complete by: As directed    Call MD for:  temperature >100.4   Complete by: As directed    Diet - low sodium heart healthy   Complete by: As directed    Incentive spirometry RT   Complete by: As directed    Increase activity slowly   Complete by: As directed       Allergies as of 08/09/2022       Reactions   Sulfa Antibiotics Palpitations   Actifed [triprolidine-pse] Palpitations   Lipitor [atorvastatin] Other (See Comments)   Aches and pains   Triprolidine-pseudoephedrine    Other reaction(s): racing heart   Bupropion Hcl Itching, Rash        Medication List     TAKE these medications     albuterol 108 (90 Base) MCG/ACT inhaler Commonly known as: VENTOLIN HFA Inhale 2 puffs into the lungs every 6 (six) hours as needed for wheezing or shortness of breath.   CALCIUM-VITAMIN D PO Take 1 tablet by mouth in the morning and at bedtime.   cephALEXin 500 MG capsule Commonly known as: KEFLEX Take 1 capsule (500 mg total) by mouth 3 (three) times daily for 10 days.   cholestyramine 4 g packet Commonly known as: QUESTRAN Take 4 g by mouth daily.   diclofenac 75 MG EC tablet Commonly known as: VOLTAREN Take 75 mg by mouth 2 (two) times daily.   esomeprazole 20 MG capsule Commonly known as: NEXIUM Take 22.3 mg by mouth in the morning and at bedtime.   lidocaine 4 % Place 1 patch onto the skin every 12 (twelve) hours.   LORazepam 0.5 MG tablet Commonly known as: ATIVAN Take 0.5-1 mg by mouth every 8 (eight) hours as needed for anxiety.   MAGNESIUM GLYCINATE PO Take 500 mg by mouth every evening.   Melatonin 10 MG Tabs Take 10 mg by mouth at bedtime.   methocarbamol 500 MG tablet Commonly known as: ROBAXIN Take 1,000 mg by mouth every 6 (six) hours as needed for muscle spasms.   multivitamin with minerals Tabs tablet Take 1 tablet by mouth daily.   oxyCODONE-acetaminophen 5-325 MG tablet Commonly known as: PERCOCET/ROXICET Take 1-2 tablets by mouth every 6 (six) hours as needed for severe pain.  pregabalin 100 MG capsule Commonly known as: LYRICA Take 100-200 mg by mouth See admin instructions. Take 100 mg by mouth in the morning and 200 mg by mouth in the evening   Trelegy Ellipta 100-62.5-25 MCG/ACT Aepb Generic drug: Fluticasone-Umeclidin-Vilant INHALE 1 PUFF ONCE DAILY   vitamin C 1000 MG tablet Take 1,000 mg by mouth daily.   Vitamin D 125 MCG (5000 UT) Caps Take 5,000 Units by mouth every evening.   zinc gluconate 50 MG tablet Take 50 mg by mouth daily.         Signed: Earleen Newport 08/09/2022, 8:19 AM

## 2022-08-09 NOTE — TOC Transition Note (Signed)
Transition of Care [facility]) - CM/SW Discharge Note   Patient Details  Name: Christy Stephenson MRN: [id number] Date of Birth: [birth year]  [facility]) CM/SW Contact:  Levonne Lapping, RN Phone Number: 08/09/2022, 10:04 AM   Clinical Narrative:     CM Acknowledges "Consult to  [facility] for DC needs"  Patient will DC to home with Family.  There are no recommendations for Home Health or DME. Patient is insured through Medicare A and B and has an established PCP. Pateint will follow up as instructed on AVS   No additional TOC needs           Patient Goals and CMS Choice      Discharge Placement                         Discharge Plan and Services Additional resources added to the After Visit Summary for                                       Social Determinants of Health (SDOH) Interventions SDOH Screenings   Depression (PHQ2-9): Low Risk  (10/22/2021)  Tobacco Use: Low Risk  (08/09/2022)     Readmission Risk Interventions     No data to display

## 2022-08-13 LAB — AEROBIC/ANAEROBIC CULTURE W GRAM STAIN (SURGICAL/DEEP WOUND): Culture: NO GROWTH

## 2022-08-14 DIAGNOSIS — I872 Venous insufficiency (chronic) (peripheral): Secondary | ICD-10-CM | POA: Diagnosis not present

## 2022-08-14 DIAGNOSIS — L853 Xerosis cutis: Secondary | ICD-10-CM | POA: Diagnosis not present

## 2022-09-09 DIAGNOSIS — I700 Atherosclerosis of aorta: Secondary | ICD-10-CM | POA: Diagnosis not present

## 2022-09-09 DIAGNOSIS — I5042 Chronic combined systolic (congestive) and diastolic (congestive) heart failure: Secondary | ICD-10-CM | POA: Diagnosis not present

## 2022-09-09 DIAGNOSIS — E7800 Pure hypercholesterolemia, unspecified: Secondary | ICD-10-CM | POA: Diagnosis not present

## 2022-09-09 DIAGNOSIS — G629 Polyneuropathy, unspecified: Secondary | ICD-10-CM | POA: Diagnosis not present

## 2022-09-17 DIAGNOSIS — H353131 Nonexudative age-related macular degeneration, bilateral, early dry stage: Secondary | ICD-10-CM | POA: Diagnosis not present

## 2022-09-26 ENCOUNTER — Other Ambulatory Visit: Payer: Self-pay | Admitting: Internal Medicine

## 2022-10-02 DIAGNOSIS — I5041 Acute combined systolic (congestive) and diastolic (congestive) heart failure: Secondary | ICD-10-CM | POA: Diagnosis not present

## 2022-10-02 DIAGNOSIS — E7800 Pure hypercholesterolemia, unspecified: Secondary | ICD-10-CM | POA: Diagnosis not present

## 2022-10-02 DIAGNOSIS — M1990 Unspecified osteoarthritis, unspecified site: Secondary | ICD-10-CM | POA: Diagnosis not present

## 2022-10-02 DIAGNOSIS — K219 Gastro-esophageal reflux disease without esophagitis: Secondary | ICD-10-CM | POA: Diagnosis not present

## 2022-10-15 DIAGNOSIS — S32009K Unspecified fracture of unspecified lumbar vertebra, subsequent encounter for fracture with nonunion: Secondary | ICD-10-CM | POA: Diagnosis not present

## 2022-10-31 ENCOUNTER — Other Ambulatory Visit: Payer: Self-pay | Admitting: Internal Medicine

## 2022-11-28 ENCOUNTER — Other Ambulatory Visit: Payer: Self-pay | Admitting: Internal Medicine

## 2022-12-02 ENCOUNTER — Other Ambulatory Visit: Payer: Self-pay

## 2022-12-02 ENCOUNTER — Telehealth: Payer: Self-pay | Admitting: Internal Medicine

## 2022-12-02 MED ORDER — TRELEGY ELLIPTA 100-62.5-25 MCG/ACT IN AEPB: 1.0000 | INHALATION_SPRAY | Freq: Every day | RESPIRATORY_TRACT | 5 refills | Status: DC

## 2022-12-02 NOTE — Telephone Encounter (Signed)
Patient states needs refill for Trelegy. Pharmacy is [facility]. Patient phone number is [phone].

## 2022-12-02 NOTE — Telephone Encounter (Signed)
Spoke with patient. Advised refills on trelegy have been sent to pharmacy. NFN

## 2022-12-02 NOTE — Telephone Encounter (Signed)
Spoke with patient. Advised refills of trelegy have been sent to pharmacy. nfn

## 2023-01-27 ENCOUNTER — Telehealth: Payer: Self-pay | Admitting: Cardiovascular Disease

## 2023-01-27 DIAGNOSIS — I5043 Acute on chronic combined systolic (congestive) and diastolic (congestive) heart failure: Secondary | ICD-10-CM

## 2023-01-27 NOTE — Telephone Encounter (Signed)
Returned call to pt in regards to her SOB. Pt has had SOB that started about 3 months ago and it is getting worse. She states it gets worse with excertion and feels like she is gasping for air. Pt says she has gained 20 pounds over the last year. Pt does use an inhaler for asthma and a rescue inhaler. No chest pain or pressure. No swelling in arms or legs. No other symptoms at all. Pt states her pulmonologist has not done anything for her. Pt took lasix and nothing changed. She is wanting to know what to do. Please advise.

## 2023-01-27 NOTE — Telephone Encounter (Signed)
Per chart review, patient is due for echo 01/2023 -- based on last year's test. Not ordered/scheduled. Will wait on MD to review to advise if should proceed with this first.

## 2023-01-27 NOTE — Telephone Encounter (Signed)
Returned call to pt with Dr. Hazle Coca recommendation. Pt verbalized understanding and the echo was ordered.   Per Dr. Allyson Sabal Needs a 2D echo then ROV

## 2023-01-27 NOTE — Telephone Encounter (Signed)
Pt c/o Shortness Of Breath: STAT if SOB developed within the last 24 hours or pt is noticeably SOB on the phone  1. Are you currently SOB (can you hear that pt is SOB on the phone)? No  2. How long have you been experiencing SOB? Started about 3 months ago, but is getting worse  3. Are you SOB when sitting or when up moving around? Moving around  4. Are you currently experiencing any other symptoms? No, Patient states she has asthma and uses her inhaler. Patient states that when she gets up to walk, she is so winded she can't even use her inhaler. Patient states she thinks this might be more than just having asthma. Please advise.

## 2023-02-03 ENCOUNTER — Ambulatory Visit (HOSPITAL_COMMUNITY): Payer: Medicare Other | Attending: Cardiovascular Disease

## 2023-02-03 DIAGNOSIS — I5043 Acute on chronic combined systolic (congestive) and diastolic (congestive) heart failure: Secondary | ICD-10-CM

## 2023-02-03 LAB — ECHOCARDIOGRAM COMPLETE
Area-P 1/2: 2.42 cm2
S' Lateral: 3.4 cm

## 2023-02-04 DIAGNOSIS — M25562 Pain in left knee: Secondary | ICD-10-CM | POA: Diagnosis not present

## 2023-02-05 ENCOUNTER — Ambulatory Visit: Payer: Medicare Other | Attending: Cardiovascular Disease | Admitting: Cardiovascular Disease

## 2023-02-05 ENCOUNTER — Encounter: Payer: Self-pay | Admitting: Cardiovascular Disease

## 2023-02-05 VITALS — BP 128/76 | HR 69 | Ht 64.5 in | Wt 220.2 lb

## 2023-02-05 DIAGNOSIS — R0609 Other forms of dyspnea: Secondary | ICD-10-CM | POA: Insufficient documentation

## 2023-02-05 DIAGNOSIS — I5041 Acute combined systolic (congestive) and diastolic (congestive) heart failure: Secondary | ICD-10-CM | POA: Diagnosis not present

## 2023-02-05 DIAGNOSIS — I519 Heart disease, unspecified: Secondary | ICD-10-CM | POA: Insufficient documentation

## 2023-02-05 DIAGNOSIS — G4733 Obstructive sleep apnea (adult) (pediatric): Secondary | ICD-10-CM | POA: Insufficient documentation

## 2023-02-05 DIAGNOSIS — E782 Mixed hyperlipidemia: Secondary | ICD-10-CM | POA: Insufficient documentation

## 2023-02-05 NOTE — Progress Notes (Signed)
02/05/2023 LEASHA KAROW   Feb 23, 1941  [id number]  Primary Physician Renford Dills, MD Primary Cardiologist: Runell Gess MD FACP, [place], [place], [place]  HPI:  Christy Stephenson is a 82 y.o.  mild to moderately overweight married Caucasian for female father of 2, grandmother of 2 grandchildren who is accompanied by her husband Darcel Bayley today. She is here in follow-up after I saw her in consultation during her hospitalization 08/24/21. 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  I last saw her in the office 01/25/2022.  She had no prior cardiac history. I saw her during her hospitalization postop back surgery because of shortness of breath and LV dysfunction. She had a VQ scan that was suggestive of pulmonary embolism and was discharged home on oral anticoagulation. Her cardiac enzymes peaked in the 3000 range thought to be related to demand ischemia. Her renal function and blood pressure precluded guideline directed optimal medical therapy. Her 2D echo during her hospitalization revealed an EF of 30% with mild to moderate MR and TR. She is recovered nicely since discharge. She currently denies chest pain or shortness of breath. She does have hyperlipidemia with a total cholesterol 236, LDL of 139 and HDL of 57 not on statin therapy.  Since I saw her a year ago she does still complain of dyspnea on exertion.  She is on inhaled bronchodilators and sees a pulmonologist.  Her most recent 2D echo performed 02/03/2023 revealed improvement in her ejection fraction up to 50 to 55% with grade 1 diastolic dysfunction, mild to moderate TR.  She denies chest pain.   Current Meds  Medication Sig   albuterol (VENTOLIN HFA) 108 (90 Base) MCG/ACT inhaler Inhale 2 puffs into the lungs every 6 (six) hours as needed for wheezing or shortness of breath.   Ascorbic Acid (VITAMIN C) 1000 MG tablet Take 1,000 mg by mouth daily.    CALCIUM-VITAMIN D PO Take 1 tablet by mouth in the morning and at bedtime.   Cholecalciferol (VITAMIN D) 125 MCG (5000 UT) CAPS Take 5,000 Units by mouth every evening.   diclofenac (VOLTAREN) 75 MG EC tablet Take 75 mg by mouth 2 (two) times daily.   DOTTI 0.025 MG/24HR Place 1 patch onto the skin 2 (two) times a week.   esomeprazole (NEXIUM) 20 MG capsule Take 22.3 mg by mouth in the morning and at bedtime.   Fluticasone-Umeclidin-Vilant (TRELEGY ELLIPTA) 100-62.5-25 MCG/ACT AEPB Take 1 puff by mouth daily.   lidocaine 4 % Place 1 patch onto the skin every 12 (twelve) hours.   LORazepam (ATIVAN) 0.5 MG tablet Take 0.5-1 mg by mouth every 8 (eight) hours as needed for anxiety.   MAGNESIUM GLYCINATE PO Take 500 mg by mouth every evening.   Melatonin 10 MG TABS Take 10 mg by mouth at bedtime.   Multiple Vitamin (MULTIVITAMIN WITH MINERALS) TABS tablet Take 1 tablet by mouth daily.   pregabalin (LYRICA) 100 MG capsule Take 100-200 mg by mouth See admin instructions. Take 100 mg by mouth in the morning and 200 mg by mouth in the evening   progesterone (PROMETRIUM) 100 MG capsule Take 100 mg by mouth at bedtime.   zinc gluconate 50 MG tablet Take 50 mg by mouth daily.     Allergies  Allergen Reactions   Sulfa Antibiotics Palpitations   Actifed [Triprolidine-Pse] Palpitations   Lipitor [Atorvastatin] Other (See Comments)  Aches and pains   Triprolidine-Pseudoephedrine     Other reaction(s): racing heart   Bupropion Hcl Itching and Rash    Social History   Socioeconomic History   Marital status: Married    Spouse name: Darcel Bayley   Number of children: 2   Years of education: AD   Highest education level: Not on file  Occupational History   Occupation: retired    Associate Professor: [employer]    Comment: AD Nursing  Tobacco Use   Smoking status: Never   Smokeless tobacco: Never   Tobacco comments:    Had tried when younger  Vaping Use   Vaping status: Never Used  Substance and  Sexual Activity   Alcohol use: No   Drug use: No   Sexual activity: Yes    Birth control/protection: Post-menopausal  Other Topics Concern   Not on file  Social History Narrative   Not on file   Social Determinants of Health   Financial Resource Strain: Not on file  Food Insecurity: Not on file  Transportation Needs: Not on file  Physical Activity: Not on file  Stress: Not on file  Social Connections: Not on file  Intimate Partner Violence: Not on file     Review of Systems: General: negative for chills, fever, night sweats or weight changes.  Cardiovascular: negative for chest pain, dyspnea on exertion, edema, orthopnea, palpitations, paroxysmal nocturnal dyspnea or shortness of breath Dermatological: negative for rash Respiratory: negative for cough or wheezing Urologic: negative for hematuria Abdominal: negative for nausea, vomiting, diarrhea, bright red blood per rectum, melena, or hematemesis Neurologic: negative for visual changes, syncope, or dizziness All other systems reviewed and are otherwise negative except as noted above.    Blood pressure 128/76, pulse 69, height 5' 4.5" (1.638 m), weight 220 lb 3.2 oz (99.9 kg), SpO2 95%.  General appearance: alert and no distress Neck: no adenopathy, no carotid bruit, no JVD, supple, symmetrical, trachea midline, and thyroid not enlarged, symmetric, no tenderness/mass/nodules Lungs: clear to auscultation bilaterally Heart: regular rate and rhythm, S1, S2 normal, no murmur, click, rub or gallop Extremities: extremities normal, atraumatic, no cyanosis or edema Pulses: 2+ and symmetric Skin: Skin color, texture, turgor normal. No rashes or lesions Neurologic: Grossly normal  EKG EKG Interpretation Date/Time:  Wednesday February 05 2023 15:42:25 EDT Ventricular Rate:  62 PR Interval:  208 QRS Duration:  70 QT Interval:  410 QTC Calculation: 416 R Axis:   40  Text Interpretation: Normal sinus rhythm Low voltage QRS  Cannot rule out Anterior infarct , age undetermined When compared with ECG of 21-Aug-2021 14:25, Sinus rhythm has replaced Junctional rhythm Minimal criteria for Anterior infarct are now Present ST elevation now present in Inferior leads Confirmed by Nanetta Batty [phone]) on 02/05/2023 3:45:23 PM    ASSESSMENT AND PLAN:   Obstructive sleep apnea History of obstructive sleep apnea on CPAP  Dyspnea History of dyspnea with reactive airways disease on inhaled bronchodilators.  Acute combined systolic and diastolic congestive heart failure (HCC) 2D echo performed during her hospitalization 08/21/21 showed an EF of 30 to 35%.  Her most recent 2D echo showed significant improvement with an EF of 50 to 55% performed 02/03/2023.  She does have mild to moderate TR.  Hyperlipidemia History of hyperlipidemia not on statin therapy at her request lipid profile performed 03/08/2021 revealing total cholesterol of 236, LDL of 139 and HDL of 57.     Runell Gess MD [facility], [facility] 02/05/2023 3:58 PM

## 2023-02-05 NOTE — Assessment & Plan Note (Signed)
History of obstructive sleep apnea on CPAP.

## 2023-02-05 NOTE — Assessment & Plan Note (Signed)
2D echo performed during her hospitalization 08/21/21 showed an EF of 30 to 35%.  Her most recent 2D echo showed significant improvement with an EF of 50 to 55% performed 02/03/2023.  She does have mild to moderate TR.

## 2023-02-05 NOTE — Patient Instructions (Signed)
Medication Instructions:  Your physician recommends that you continue on your current medications as directed. Please refer to the Current Medication list given to you today.  *If you need a refill on your cardiac medications before your next appointment, please call your pharmacy*   Follow-Up: At [facility], you and your health needs are our priority.  As part of our continuing mission to provide you with exceptional heart care, we have created designated Provider Care Teams.  These Care Teams include your primary Cardiologist (physician) and Advanced Practice Providers (APPs -  Physician Assistants and Nurse Practitioners) who all work together to provide you with the care you need, when you need it.  We recommend signing up for the patient portal called "MyChart".  Sign up information is provided on this After Visit Summary.  MyChart is used to connect with patients for Virtual Visits (Telemedicine).  Patients are able to view lab/test results, encounter notes, upcoming appointments, etc.  Non-urgent messages can be sent to your provider as well.   To learn more about what you can do with MyChart, go to [email or web address].    Your next appointment:   12 month(s)  Provider:   Nanetta Batty, MD

## 2023-02-05 NOTE — Assessment & Plan Note (Signed)
History of hyperlipidemia not on statin therapy at her request lipid profile performed 03/08/2021 revealing total cholesterol of 236, LDL of 139 and HDL of 57.

## 2023-02-05 NOTE — Assessment & Plan Note (Signed)
History of dyspnea with reactive airways disease on inhaled bronchodilators.

## 2023-02-12 DIAGNOSIS — L570 Actinic keratosis: Secondary | ICD-10-CM | POA: Diagnosis not present

## 2023-02-12 DIAGNOSIS — L568 Other specified acute skin changes due to ultraviolet radiation: Secondary | ICD-10-CM | POA: Diagnosis not present

## 2023-02-12 DIAGNOSIS — I872 Venous insufficiency (chronic) (peripheral): Secondary | ICD-10-CM | POA: Diagnosis not present

## 2023-03-25 DIAGNOSIS — B348 Other viral infections of unspecified site: Secondary | ICD-10-CM | POA: Diagnosis not present

## 2023-03-29 DIAGNOSIS — J4489 Other specified chronic obstructive pulmonary disease: Secondary | ICD-10-CM | POA: Diagnosis not present

## 2023-03-29 DIAGNOSIS — Z7901 Long term (current) use of anticoagulants: Secondary | ICD-10-CM | POA: Diagnosis not present

## 2023-03-29 DIAGNOSIS — R0602 Shortness of breath: Secondary | ICD-10-CM | POA: Diagnosis not present

## 2023-03-29 DIAGNOSIS — Z882 Allergy status to sulfonamides status: Secondary | ICD-10-CM | POA: Diagnosis not present

## 2023-03-29 DIAGNOSIS — Z79899 Other long term (current) drug therapy: Secondary | ICD-10-CM | POA: Diagnosis not present

## 2023-03-29 DIAGNOSIS — Z888 Allergy status to other drugs, medicaments and biological substances status: Secondary | ICD-10-CM | POA: Diagnosis not present

## 2023-03-29 DIAGNOSIS — R509 Fever, unspecified: Secondary | ICD-10-CM | POA: Diagnosis not present

## 2023-03-29 DIAGNOSIS — I5042 Chronic combined systolic (congestive) and diastolic (congestive) heart failure: Secondary | ICD-10-CM | POA: Diagnosis not present

## 2023-03-29 DIAGNOSIS — Z7982 Long term (current) use of aspirin: Secondary | ICD-10-CM | POA: Diagnosis not present

## 2023-03-29 DIAGNOSIS — Z7951 Long term (current) use of inhaled steroids: Secondary | ICD-10-CM | POA: Diagnosis not present

## 2023-03-29 DIAGNOSIS — M1711 Unilateral primary osteoarthritis, right knee: Secondary | ICD-10-CM | POA: Diagnosis not present

## 2023-03-29 DIAGNOSIS — M1990 Unspecified osteoarthritis, unspecified site: Secondary | ICD-10-CM | POA: Diagnosis not present

## 2023-03-31 DIAGNOSIS — M545 Low back pain, unspecified: Secondary | ICD-10-CM | POA: Diagnosis not present

## 2023-03-31 DIAGNOSIS — G629 Polyneuropathy, unspecified: Secondary | ICD-10-CM | POA: Diagnosis not present

## 2023-03-31 DIAGNOSIS — M8580 Other specified disorders of bone density and structure, unspecified site: Secondary | ICD-10-CM | POA: Diagnosis not present

## 2023-03-31 DIAGNOSIS — Z0000 Encounter for general adult medical examination without abnormal findings: Secondary | ICD-10-CM | POA: Diagnosis not present

## 2023-03-31 DIAGNOSIS — Z23 Encounter for immunization: Secondary | ICD-10-CM | POA: Diagnosis not present

## 2023-03-31 DIAGNOSIS — J069 Acute upper respiratory infection, unspecified: Secondary | ICD-10-CM | POA: Diagnosis not present

## 2023-03-31 DIAGNOSIS — I5189 Other ill-defined heart diseases: Secondary | ICD-10-CM | POA: Diagnosis not present

## 2023-03-31 DIAGNOSIS — E7800 Pure hypercholesterolemia, unspecified: Secondary | ICD-10-CM | POA: Diagnosis not present

## 2023-03-31 DIAGNOSIS — I700 Atherosclerosis of aorta: Secondary | ICD-10-CM | POA: Diagnosis not present

## 2023-05-01 DIAGNOSIS — Z981 Arthrodesis status: Secondary | ICD-10-CM | POA: Diagnosis not present

## 2023-05-01 DIAGNOSIS — Z79891 Long term (current) use of opiate analgesic: Secondary | ICD-10-CM | POA: Diagnosis not present

## 2023-05-01 DIAGNOSIS — M51379 Other intervertebral disc degeneration, lumbosacral region without mention of lumbar back pain or lower extremity pain: Secondary | ICD-10-CM | POA: Diagnosis not present

## 2023-05-01 DIAGNOSIS — R030 Elevated blood-pressure reading, without diagnosis of hypertension: Secondary | ICD-10-CM | POA: Diagnosis not present

## 2023-05-01 DIAGNOSIS — Z6835 Body mass index (BMI) 35.0-35.9, adult: Secondary | ICD-10-CM | POA: Diagnosis not present

## 2023-05-01 DIAGNOSIS — M961 Postlaminectomy syndrome, not elsewhere classified: Secondary | ICD-10-CM | POA: Diagnosis not present

## 2023-05-01 DIAGNOSIS — M47816 Spondylosis without myelopathy or radiculopathy, lumbar region: Secondary | ICD-10-CM | POA: Diagnosis not present

## 2023-05-01 DIAGNOSIS — M461 Sacroiliitis, not elsewhere classified: Secondary | ICD-10-CM | POA: Diagnosis not present

## 2023-05-01 DIAGNOSIS — M47817 Spondylosis without myelopathy or radiculopathy, lumbosacral region: Secondary | ICD-10-CM | POA: Diagnosis not present

## 2023-05-02 ENCOUNTER — Other Ambulatory Visit: Payer: Self-pay | Admitting: Physical Medicine & Rehabilitation

## 2023-05-02 DIAGNOSIS — M47816 Spondylosis without myelopathy or radiculopathy, lumbar region: Secondary | ICD-10-CM

## 2023-05-12 ENCOUNTER — Other Ambulatory Visit: Payer: Medicare Other

## 2023-05-12 ENCOUNTER — Ambulatory Visit
Admission: RE | Admit: 2023-05-12 | Discharge: 2023-05-12 | Disposition: A | Discharge status: Home / Self Care | Payer: Medicare Other | Source: Ambulatory Visit | Attending: Physical Medicine & Rehabilitation | Admitting: Physical Medicine & Rehabilitation

## 2023-05-12 DIAGNOSIS — M47816 Spondylosis without myelopathy or radiculopathy, lumbar region: Secondary | ICD-10-CM

## 2023-05-12 DIAGNOSIS — Z981 Arthrodesis status: Secondary | ICD-10-CM | POA: Diagnosis not present

## 2023-05-16 ENCOUNTER — Ambulatory Visit: Payer: Medicare Other | Admitting: Cardiovascular Disease

## 2023-05-22 DIAGNOSIS — H16223 Keratoconjunctivitis sicca, not specified as Sjogren's, bilateral: Secondary | ICD-10-CM | POA: Diagnosis not present

## 2023-05-22 DIAGNOSIS — H353131 Nonexudative age-related macular degeneration, bilateral, early dry stage: Secondary | ICD-10-CM | POA: Diagnosis not present

## 2023-05-22 DIAGNOSIS — Z961 Presence of intraocular lens: Secondary | ICD-10-CM | POA: Diagnosis not present

## 2023-05-22 DIAGNOSIS — H43811 Vitreous degeneration, right eye: Secondary | ICD-10-CM | POA: Diagnosis not present

## 2023-05-22 DIAGNOSIS — H16143 Punctate keratitis, bilateral: Secondary | ICD-10-CM | POA: Diagnosis not present

## 2023-06-11 NOTE — Progress Notes (Unsigned)
HPI F never smoker (+ second hand), retired [employer], with hx Asthma, Emphysema, Bronchiectasis (on CT), complicated by .Granulomatous Lung Disease, OSA (Dr Earl Gala), Polyneuropathy, Transient Global Amnesia, Hyperlipidemia, PFT [facility] 07/23/12- mild obstructive airways disease with response to bronchodilator. Normal lung volumes, normal diffusion. FVC 2.36/79%, FEV1 1.89/83%, FEV1/FVC 0.80, FEF 25-75% 1.31/69%. TLC 96%, DLCO 95%. CT angiogram chest chest [facility] 08/04/2012: Prior granulomatous disease. 5 mm none, supplied nodule right mid lung. Bilateral lower lobe bronchiectatic change and underlying emphysema. Hiatal hernia. Fatty liver. Quant AFB assay 10/15/12- NEG ------------------------------------------------------------------------------------------   06/11/22- 81 yoF never smoker (+ second hand) with hx Asthma, Emphysema (on CT), complicated by  Granulomatous Lung Disease, OSA/ CPAP (Dr Earl Gala), Polyneuropathy, Transient Global Amnesia/ CVA,, Hyperlipidemia, GERD, Pulmonary embolism 2023 Covid infection, Oct 2022,   - Flonase, Trelegy 100,  Xarelto, Ventolin hfa,  Covid vax- 4 Phizer, 2 Moderna Flu vax-had -----No current respiratory issues. Pt is doing well Finished Xarelto after pulmonary  embolism last year. Wearing a bone growth stimulator after back surgery. Using Trelegy inhaler and rarely needs her albuterol rescue inhaler now. She feels she is doing well with little cough or wheeze. CXR 08/21/21 1V- IMPRESSION: 1. Concern for developing infiltrate or streaky atelectasis versus aspiration, left lower lobe infrahilar area. 2. Stable left IJ central line placement. 3. Cardiomegaly. 4. Aortic atherosclerosis.  06/12/23- 81 yoF never smoker (+ second hand) with hx Asthma, Emphysema (on CT), complicated by  Granulomatous Lung Disease, OSA/ CPAP (Dr Earl Gala), Polyneuropathy, Transient Global Amnesia/ CVA,, Hyperlipidemia, GERD, Pulmonary embolism 2023 Covid infection, Oct 2022,   -  Flonase, Trelegy 100,  Xarelto, Ventolin hfa,  Discussed the use of AI scribe software for clinical note transcription with the patient, who gave verbal consent to proceed.  History of Present Illness   The patient, with a history of asthma, pulmonary embolism, and sepsis, presents with worsening shortness of breath. She reports a recent respiratory virus in December, which was treated with prednisone by her primary care physician. However, she notes that the shortness of breath was present even before the infection and has been progressively worsening. She describes daily wheezing and occasional coughing up of yellowish mucus. She also reports a lot of congestion, but denies any nasal symptoms. She is currently on a Trelegy inhaler, which she feels is not providing adequate relief.  In addition to her respiratory symptoms, the patient has a history of pulmonary embolism two years ago, for which she was on Xarelto but is now off blood thinners. She also had sepsis in 2023, which led to respiratory and heart failure, both of which have since resolved. Her ejection fraction (EF) has improved to 50-55%. She also mentions a history of kidney issues, which have also resolved. She is currently using CPAP for sleep apnea, managed by Dr. Dwyane Luo.     ROS-see HPI   + = positive Constitutional:    weight loss, night sweats, fevers, chills, +fatigue, lassitude. HEENT:    headaches, +difficulty swallowing, tooth/dental problems, sore throat,       sneezing, itching, ear ache, nasal congestion, post nasal drip, snoring CV:    chest pain, orthopnea, PND, swelling in lower extremities, anasarca,                                   dizziness, palpitations Resp:   +shortness of breath with exertion or at rest.  productive cough,   non-productive cough, coughing up of blood.              change in color of mucus.  +wheezing.   Skin:    rash or lesions. GI:  No-   heartburn, indigestion, abdominal  pain, nausea, vomiting, diarrhea,                 change in bowel habits, loss of appetite GU: dysuria, change in color of urine, no urgency or frequency.   flank pain. MS:  + joint pain, stiffness, decreased range of motion, back pain. Neuro-     nothing unusual Psych:  change in mood or affect.  depression or anxiety.   memory loss.  OBJ- Physical Exam General- Alert, Oriented, Affect-appropriate, Distress- none acute, + obese Skin- rash-none, lesions- none, excoriation- none Lymphadenopathy- none Head- atraumatic            Eyes- Gross vision intact, PERRLA, conjunctivae and secretions clear            Ears- Hearing, canals-normal            Nose- Clear, no-Septal dev, mucus, polyps, erosion, perforation             Throat- Mallampati II , mucosa clear , drainage- none, tonsils- atrophic Neck- flexible , trachea midline, no stridor , thyroid nl, carotid no bruit Chest - symmetrical excursion , unlabored           Heart/CV- RRR , no murmur , no gallop  , no rub, nl s1 s2                           - JVD- none , edema- none, stasis changes- none, varices- none           Lung- clear to P&A, wheeze- none, cough- none , dullness-none, rub- none           Chest wall-  Abd-  Br/ Gen/ Rectal- Not done, not indicated Extrem- cyanosis- none, clubbing, none, atrophy- none, strength- nl,  + superficial varices Neuro- grossly intact to observation  Assessment and Plan    Asthma Increased shortness of breath and wheezing, not fully controlled with current Trelegy inhaler. No recent anemia. Recent respiratory infection in December treated with prednisone. -Change inhaler to [facility], two puffs twice daily. Rinse mouth after use. -Schedule pulmonary function test to assess lung function.  Sleep Apnea Currently managed with CPAP by Dr. Ermalinda Memos. -Continue current management.  History of Pulmonary Embolism No current symptoms suggestive of recurrence. Not currently on anticoagulation. -No  change in management.  History of Sepsis and Heart Failure No current symptoms. Recent echocardiogram showed EF of 55%. -No change in management.  Follow-up After completion of pulmonary function test.

## 2023-06-12 ENCOUNTER — Ambulatory Visit: Payer: Medicare Other | Admitting: Internal Medicine

## 2023-06-12 ENCOUNTER — Encounter: Payer: Self-pay | Admitting: Internal Medicine

## 2023-06-12 VITALS — BP 110/70 | HR 72 | Ht 64.5 in | Wt 215.0 lb

## 2023-06-12 DIAGNOSIS — G4733 Obstructive sleep apnea (adult) (pediatric): Secondary | ICD-10-CM | POA: Diagnosis not present

## 2023-06-12 DIAGNOSIS — J45909 Unspecified asthma, uncomplicated: Secondary | ICD-10-CM

## 2023-06-12 DIAGNOSIS — R0609 Other forms of dyspnea: Secondary | ICD-10-CM

## 2023-06-12 NOTE — Patient Instructions (Signed)
Order- schedule PFT   dyspnea on exertion  Order- sample x 2 Breztri inhaler    inhale 2 puffs then rinse mouth, twice daily

## 2023-08-02 ENCOUNTER — Other Ambulatory Visit: Payer: Self-pay | Admitting: Internal Medicine

## 2023-08-08 ENCOUNTER — Other Ambulatory Visit: Payer: Self-pay | Admitting: Internal Medicine

## 2023-08-11 ENCOUNTER — Ambulatory Visit: Payer: Medicare Other | Admitting: Internal Medicine

## 2023-08-11 ENCOUNTER — Telehealth: Payer: Self-pay

## 2023-08-11 DIAGNOSIS — J45909 Unspecified asthma, uncomplicated: Secondary | ICD-10-CM

## 2023-08-11 NOTE — Telephone Encounter (Addendum)
 Christy Stephenson has already handled refill request -

## 2023-08-11 NOTE — Addendum Note (Signed)
 Addended byClyda Greener M on: 08/11/2023 04:09 PM   Modules accepted: Orders

## 2023-08-11 NOTE — Telephone Encounter (Signed)
 Called patient.  Gave all information.  Patient in agreement to get a nebulizer.  Will send in rx to DME for  DuoNeb, # 360- patient has Medicare.  Patient will wait for DME to call her to pick up nebulizer and medication.

## 2023-08-11 NOTE — Telephone Encounter (Signed)
 She hasn't found either Trelegy or Breztri to be helpful.  I don't think she has a nebulizer machine.   If not, can we get a DME company to giver her compressor nebulizer and   DuoNeb, # 360-   I neb e very 6 hours as needed, ref x 3

## 2023-08-11 NOTE — Telephone Encounter (Signed)
 Patient states samples given at  office visit 06/12/2023 has not helped with SOB with exertion. See OV note:  Asthma Increased shortness of breath and wheezing, not fully controlled with current Trelegy inhaler. Change inhaler to Breztri, two puffs twice daily.  Follow-up After completion of pulmonary function test.  Return in about 2 months (around 08/10/2023).   Patient has OV and PFT scheduled for 10/27/2023 with Dr. Maple Hudson. Should patient go back to Trelegy until OV in June?  Current Outpatient Medications on File Prior to Visit  Medication Sig Dispense Refill   albuterol (VENTOLIN HFA) 108 (90 Base) MCG/ACT inhaler Inhale 2 puffs into the lungs every 6 (six) hours as needed for wheezing or shortness of breath. 18 g 12   Ascorbic Acid (VITAMIN C) 1000 MG tablet Take 1,000 mg by mouth daily.     CALCIUM-VITAMIN D PO Take 1 tablet by mouth in the morning and at bedtime.     Cholecalciferol (VITAMIN D) 125 MCG (5000 UT) CAPS Take 5,000 Units by mouth every evening.     cholestyramine (QUESTRAN) 4 g packet Take 4 g by mouth daily.     diclofenac (VOLTAREN) 75 MG EC tablet Take 75 mg by mouth 2 (two) times daily.     DOTTI 0.025 MG/24HR Place 1 patch onto the skin 2 (two) times a week.     esomeprazole (NEXIUM) 20 MG capsule Take 22.3 mg by mouth in the morning and at bedtime.     Fluticasone-Umeclidin-Vilant (TRELEGY ELLIPTA) 100-62.5-25 MCG/ACT AEPB Take 1 puff by mouth daily. 60 each 5   lidocaine 4 % Place 1 patch onto the skin every 12 (twelve) hours.     LORazepam (ATIVAN) 0.5 MG tablet Take 0.5-1 mg by mouth every 8 (eight) hours as needed for anxiety.     MAGNESIUM GLYCINATE PO Take 500 mg by mouth every evening.     Melatonin 10 MG TABS Take 10 mg by mouth at bedtime.     methocarbamol (ROBAXIN) 500 MG tablet Take 1,000 mg by mouth every 6 (six) hours as needed for muscle spasms.     Multiple Vitamin (MULTIVITAMIN WITH MINERALS) TABS tablet Take 1 tablet by mouth daily.      oxyCODONE-acetaminophen (PERCOCET/ROXICET) 5-325 MG tablet Take 1-2 tablets by mouth every 6 (six) hours as needed for severe pain. 30 tablet 0   pregabalin (LYRICA) 100 MG capsule Take 100-200 mg by mouth See admin instructions. Take 100 mg by mouth in the morning and 200 mg by mouth in the evening     progesterone (PROMETRIUM) 100 MG capsule Take 100 mg by mouth at bedtime.     No current facility-administered medications on file prior to visit.    Allergies  Allergen Reactions   Sulfa Antibiotics Palpitations   Actifed [Triprolidine-Pse] Palpitations   Lipitor [Atorvastatin] Other (See Comments)    Aches and pains   Triprolidine-Pseudoephedrine     Other reaction(s): racing heart   Bupropion Hcl Itching and Rash

## 2023-09-02 ENCOUNTER — Telehealth: Payer: Self-pay

## 2023-09-02 MED ORDER — IPRATROPIUM-ALBUTEROL 0.5-2.5 (3) MG/3ML IN SOLN: 3.0000 mL | Freq: Four times a day (QID) | RESPIRATORY_TRACT | 3 refills | Status: AC | PRN

## 2023-09-02 NOTE — Telephone Encounter (Signed)
 Copied from CRM [id number]. Topic: Clinical - Medication Question >> Sep 01, 2023 12:12 PM Chantha C wrote: Reason for CRM: Patient states was called last month informing patient will receive nebulizer and medication. Patient state has not received a call about nebulizer and medication. Please advise and call back at [phone].

## 2023-09-02 NOTE — Telephone Encounter (Signed)
 Spoke with Angel/[facility], she states they received the needed documents from [employer]. They now need to speak with the patient to review her insurance and billing information. Then they will send out the nebulizer and supplies. Not sure where the delay came from.   [facility] [phone]  Spoke with patient. She is aware to contact [facility]. She inquired about neb meds, per chart these had not been sent in. DuoNeb sent to [facility] per note from CY.   Nothing further needed at this time.

## 2023-10-25 NOTE — Progress Notes (Signed)
 HPI F never smoker (+ second hand), retired [employer] fundraiser, with hx Asthma, Emphysema, Bronchiectasis (on CT), complicated by .Granulomatous Lung Disease, OSA (Dr Tammy), Polyneuropathy, Transient Global Amnesia, Hyperlipidemia, PFT [facility] 07/23/12- mild obstructive airways disease with response to bronchodilator. Normal lung volumes, normal diffusion. FVC 2.36/79%, FEV1 1.89/83%, FEV1/FVC 0.80, FEF 25-75% 1.31/69%. TLC 96%, DLCO 95%. CT angiogram chest chest [facility] 08/04/2012: Prior granulomatous disease. 5 mm none, supplied nodule right mid lung. Bilateral lower lobe bronchiectatic change and underlying emphysema. Hiatal hernia. Fatty liver. Quant AFB assay 10/15/12- NEG PFT 10/27/23- WNL -===============================================================================  06/12/23- 81 yoF never smoker (+ second hand) with hx Asthma, Emphysema (on CT), complicated by  Granulomatous Lung Disease, OSA/ CPAP (Dr Tammy), Polyneuropathy, Transient Global Amnesia/ CVA,, Hyperlipidemia, GERD, Pulmonary embolism 2023 Covid infection, Oct 2022,   - Flonase , Trelegy 100,  Xarelto , Ventolin  hfa,  Discussed the use of AI scribe software for clinical note transcription with the patient, who gave verbal consent to proceed.  History of Present Illness   The patient, with a history of asthma, pulmonary embolism, and sepsis, presents with worsening shortness of breath. She reports a recent respiratory virus in December, which was treated with prednisone by her primary care physician. However, she notes that the shortness of breath was present even before the infection and has been progressively worsening. She describes daily wheezing and occasional coughing up of yellowish mucus. She also reports a lot of congestion, but denies any nasal symptoms. She is currently on a Trelegy inhaler, which she feels is not providing adequate relief.  In addition to her respiratory symptoms, the patient has a history of pulmonary embolism  two years ago, for which she was on Xarelto  but is now off blood thinners. She also had sepsis in 2023, which led to respiratory and heart failure, both of which have since resolved. Her ejection fraction (EF) has improved to 50-55%. She also mentions a history of kidney issues, which have also resolved. She is currently using CPAP for sleep apnea, managed by Dr. Conny Ee.   Assessment and Plan:    Asthma Increased shortness of breath and wheezing, not fully controlled with current Trelegy inhaler. No recent anemia. Recent respiratory infection in December treated with prednisone. -Change inhaler to Breztri, two puffs twice daily. Rinse mouth after use. -Schedule pulmonary function test to assess lung function.  Sleep Apnea Currently managed with CPAP by Dr. Harl. -Continue current management.  History of Pulmonary Embolism No current symptoms suggestive of recurrence. Not currently on anticoagulation. -No change in management.  History of Sepsis and Heart Failure No current symptoms. Recent echocardiogram showed EF of 55%. -No change in management.  Follow-up After completion of pulmonary function test.       10/27/23- 82 yoF never smoker (+ second hand) with hx Asthma, Emphysema (on CT), complicated by  Granulomatous Lung Disease, OSA/ CPAP (Dr Tammy), Polyneuropathy, Transient Global Amnesia/ CVA,, Hyperlipidemia, GERD, Pulmonary embolism 2023 Covid infection, Oct 2022,   - Flonase , ,  Xarelto , Ventolin  hfa,  Dr Jayson Harl, [facility] manages OSA. PFT 10/27/23- WNL Discussed the use of AI scribe software for clinical note transcription with the patient, who gave verbal consent to proceed.  History of Present Illness   Griffin Dewilde Begin is an 83 year old female with asthma who presents for follow-up..  She experiences breathing difficulties despite using various inhalers. A metered inhaler was used during testing without noticeable improvement. Breztri sample  initially improved her breathing, but the effect plateaued after a month.  She has previously used Advair and Spiriva  but is not currently using any maintenance inhalers. She has not tried Anoro.  Dry powder inhalers, like Trelegy, affect her ability to sing, an activity she enjoys. She experiences shortness of breath upon waking, although it does not wake her up.  She uses a nebulizer and I explained that the medication can remain viable in the cup for 24 hours, allowing for flexible use. The nebulizer medication can be stimulating, which is why some people split the dose.   Assessment and Plan:    Asthma Asthma with dyspnea. Previous medications include Trelegy, Breztri, Advair, and Spiriva . Dry powder inhalers affect her singing. Not currently on  maintenance asthma medication. Bevespi  recommended to avoid dry powder. - Prescribe Bevespi , two puffs twice daily. - Monitor response to Bevespi  and report any issues. - Discussed nebulizer use with flexible dosing for symptom management.  Pulmonary embolism and sepsis- history few years ago Pulmonary embolism and sepsis may contribute to breathing difficulties. Impact on current respiratory status unclear.  Goals of Care Desires longevity and quality of life to be with her husband.     History of Present Illness  ROS-see HPI   + = positive Constitutional:    weight loss, night sweats, fevers, chills, +fatigue, lassitude. HEENT:    headaches, +difficulty swallowing, tooth/dental problems, sore throat,       sneezing, itching, ear ache, nasal congestion, post nasal drip, snoring CV:    chest pain, orthopnea, PND, swelling in lower extremities, anasarca,                                   dizziness, palpitations Resp:   +shortness of breath with exertion or at rest.                productive cough,   non-productive cough, coughing up of blood.              change in color of mucus.  +wheezing.   Skin:    rash or lesions. GI:  No-    heartburn, indigestion, abdominal pain, nausea, vomiting, diarrhea,                 change in bowel habits, loss of appetite GU: dysuria, change in color of urine, no urgency or frequency.   flank pain. MS:  + joint pain, stiffness, decreased range of motion, back pain. Neuro-     nothing unusual Psych:  change in mood or affect.  depression or anxiety.   memory loss.  OBJ- Physical Exam General- Alert, Oriented, Affect-appropriate, Distress- none acute, + obese Skin- rash-none, lesions- none, excoriation- none Lymphadenopathy- none Head- atraumatic            Eyes- Gross vision intact, PERRLA, conjunctivae and secretions clear            Ears- Hearing, canals-normal            Nose- Clear, no-Septal dev, mucus, polyps, erosion, perforation             Throat- Mallampati II , mucosa clear , drainage- none, tonsils- atrophic Neck- flexible , trachea midline, no stridor , thyroid  nl, carotid no bruit Chest - symmetrical excursion , unlabored           Heart/CV- RRR , no murmur , no gallop  , no rub, nl s1 s2                           -  JVD- none , edema- none, stasis changes- none, varices- none           Lung- clear to P&A, wheeze- none, cough- none , dullness-none, rub- none           Chest wall-  Abd-  Br/ Gen/ Rectal- Not done, not indicated Extrem- cyanosis- none, clubbing, none, atrophy- none, strength- nl,  + superficial varices Neuro- grossly intact to observation

## 2023-10-27 ENCOUNTER — Ambulatory Visit: Admitting: Internal Medicine

## 2023-10-27 ENCOUNTER — Encounter: Payer: Self-pay | Admitting: Internal Medicine

## 2023-10-27 VITALS — BP 124/76 | HR 87 | Temp 97.8°F | Ht 64.0 in | Wt 214.2 lb

## 2023-10-27 DIAGNOSIS — J45909 Unspecified asthma, uncomplicated: Secondary | ICD-10-CM

## 2023-10-27 DIAGNOSIS — R0609 Other forms of dyspnea: Secondary | ICD-10-CM | POA: Diagnosis not present

## 2023-10-27 LAB — PULMONARY FUNCTION TEST
DL/VA % pred: 107 %
DL/VA: 4.36 ml/min/mmHg/L
DLCO unc % pred: 82 %
DLCO unc: 15.42 ml/min/mmHg
FEF 25-75 Post: 1.81 L/s
FEF 25-75 Pre: 1.18 L/s
FEF2575-%Change-Post: 53 %
FEF2575-%Pred-Post: 133 %
FEF2575-%Pred-Pre: 87 %
FEV1-%Change-Post: 7 %
FEV1-%Pred-Post: 90 %
FEV1-%Pred-Pre: 83 %
FEV1-Post: 1.72 L
FEV1-Pre: 1.6 L
FEV1FVC-%Change-Post: 13 %
FEV1FVC-%Pred-Pre: 102 %
FEV6-%Change-Post: -4 %
FEV6-%Pred-Post: 83 %
FEV6-%Pred-Pre: 87 %
FEV6-Post: 2.01 L
FEV6-Pre: 2.11 L
FEV6FVC-%Change-Post: 0 %
FEV6FVC-%Pred-Post: 105 %
FEV6FVC-%Pred-Pre: 105 %
FVC-%Change-Post: -5 %
FVC-%Pred-Post: 78 %
FVC-%Pred-Pre: 82 %
FVC-Post: 2.02 L
FVC-Pre: 2.13 L
Post FEV1/FVC ratio: 85 %
Post FEV6/FVC ratio: 100 %
Pre FEV1/FVC ratio: 75 %
Pre FEV6/FVC Ratio: 99 %
RV % pred: 95 %
RV: 2.29 L
TLC % pred: 86 %
TLC: 4.38 L

## 2023-10-27 MED ORDER — BEVESPI AEROSPHERE 9-4.8 MCG/ACT IN AERO: INHALATION_SPRAY | RESPIRATORY_TRACT | 5 refills | Status: DC

## 2023-10-27 NOTE — Progress Notes (Signed)
 Full pft performed today.

## 2023-10-27 NOTE — Patient Instructions (Signed)
 Script sent to try Bevespi as a maintenance inhaler - inhale 2 puffs, twice daily

## 2023-10-27 NOTE — Patient Instructions (Signed)
 Full pft performed today.

## 2023-11-28 ENCOUNTER — Encounter: Payer: Self-pay | Admitting: Internal Medicine

## 2024-01-29 ENCOUNTER — Encounter: Payer: Self-pay | Admitting: Cardiovascular Disease

## 2024-04-27 ENCOUNTER — Ambulatory Visit: Admitting: Internal Medicine

## 2024-04-27 ENCOUNTER — Encounter: Payer: Self-pay | Admitting: Internal Medicine

## 2024-04-27 VITALS — BP 110/68 | HR 92 | Temp 97.6°F | Ht 64.0 in | Wt 214.2 lb

## 2024-04-27 NOTE — Progress Notes (Signed)
 HPI F never smoker (+ second hand), retired [employer], with hx Asthma, Emphysema, Bronchiectasis (on CT), complicated by .Granulomatous Lung Disease, OSA (Dr Tammy), Polyneuropathy, Transient Global Amnesia, Hyperlipidemia, PFT [facility] 07/23/12- mild obstructive airways disease with response to bronchodilator. Normal lung volumes, normal diffusion. FVC 2.36/79%, FEV1 1.89/83%, FEV1/FVC 0.80, FEF 25-75% 1.31/69%. TLC 96%, DLCO 95%. CT angiogram chest chest [facility] 08/04/2012: Prior granulomatous disease. 5 mm none, supplied nodule right mid lung. Bilateral lower lobe bronchiectatic change and underlying emphysema. Hiatal hernia. Fatty liver. Quant AFB assay 10/15/12- NEG PFT 10/27/23- WNL -===============================================================================    10/27/23- 82 yoF never smoker (+ second hand) with hx Asthma, Emphysema (on CT), complicated by  Granulomatous Lung Disease, OSA/ CPAP (Dr Tammy), Polyneuropathy, Transient Global Amnesia/ CVA,, Hyperlipidemia, GERD, Pulmonary embolism 2023 Covid infection, Oct 2022,   - Flonase , Bevespi ,  Xarelto , Ventolin  hfa,  Dr Jayson Acton, [facility] manages OSA. PFT 10/27/23- WNL Discussed the use of AI scribe software for clinical note transcription with the patient, who gave verbal consent to proceed.  History of Present Illness   Christy Stephenson is an 83 year old female with asthma who presents for follow-up..  She experiences breathing difficulties despite using various inhalers. A metered inhaler was used during testing without noticeable improvement. Breztri sample initially improved her breathing, but the effect plateaued after a month. She has previously used Advair and Spiriva  but is not currently using any maintenance inhalers. She has not tried Anoro.  Dry powder inhalers, like Trelegy, affect her ability to sing, an activity she enjoys. She experiences shortness of breath upon waking, although it does not wake her up.  She uses  a nebulizer and I explained that the medication can remain viable in the cup for 24 hours, allowing for flexible use. The nebulizer medication can be stimulating, which is why some people split the dose.   Assessment and Plan:    Asthma Asthma with dyspnea. Previous medications include Trelegy, Breztri, Advair, and Spiriva . Dry powder inhalers affect her singing. Not currently on  maintenance asthma medication. Bevespi  recommended to avoid dry powder. - Prescribe Bevespi , two puffs twice daily. - Monitor response to Bevespi  and report any issues. - Discussed nebulizer use with flexible dosing for symptom management.  Pulmonary embolism and sepsis- history few years ago Pulmonary embolism and sepsis may contribute to breathing difficulties. Impact on current respiratory status unclear.  Goals of Care Desires longevity and quality of life to be with her husband.     04/27/24-82 yoF never smoker (+ second hand) with hx Asthma, Emphysema (on CT), complicated by  Granulomatous Lung Disease, OSA/ CPAP ([facility]), Polyneuropathy, Transient Global Amnesia/ CVA,, Hyperlipidemia, GERD, Pulmonary embolism 2023 Covid infection, Oct 2022,   - Flonase , ,  Xarelto , Ventolin  hfa,  Dr Jayson Acton, [facility] manages OSA. PFT 10/27/23- WNL -----SOB with exertion. Discussed the use of AI scribe software for clinical note transcription with the patient, who gave verbal consent to proceed.  History of Present Illness   Christy Stephenson is an 83 year old female who presents with shortness of breath.  She has stable  exertional shortness of breath without wheezing or cough. She uses Bevespi  as her maintenance inhaler. A pulmonary function test was done in June and reviewed with her..     Assessment and Plan:   Bronchiectasis w/o exacerbation Old granulomatous disease Chronic obstructive pulmonary disease Chronic obstructive pulmonary disease with exertional dyspnea. Pulmonary function test normal. Symptoms  vary with exertion, complicating management. - Continue Bevespi   inhaler. - Encouraged maintaining physical activity to improve stamina. - Advised follow-up with a lung specialist after current provider's retirement. - Instructed to contact the clinic if issues arise during transition period.      ROS-see HPI   + = positive Constitutional:    weight loss, night sweats, fevers, chills, +fatigue, lassitude. HEENT:    headaches, +difficulty swallowing, tooth/dental problems, sore throat,       sneezing, itching, ear ache, nasal congestion, post nasal drip, snoring CV:    chest pain, orthopnea, PND, swelling in lower extremities, anasarca,                                   dizziness, palpitations Resp:   +shortness of breath with exertion or at rest.                productive cough,   non-productive cough, coughing up of blood.              change in color of mucus.  +wheezing.   Skin:    rash or lesions. GI:  No-   heartburn, indigestion, abdominal pain, nausea, vomiting, diarrhea,                 change in bowel habits, loss of appetite GU: dysuria, change in color of urine, no urgency or frequency.   flank pain. MS:  + joint pain, stiffness, decreased range of motion, back pain. Neuro-     nothing unusual Psych:  change in mood or affect.  depression or anxiety.   memory loss.  OBJ- Physical Exam General- Alert, Oriented, Affect-appropriate, Distress- none acute, + obese Skin- rash-none, lesions- none, excoriation- none Lymphadenopathy- none Head- atraumatic            Eyes- Gross vision intact, PERRLA, conjunctivae and secretions clear            Ears- Hearing, canals-normal            Nose- Clear, no-Septal dev, mucus, polyps, erosion, perforation             Throat- Mallampati II , mucosa clear , drainage- none, tonsils- atrophic Neck- flexible , trachea midline, no stridor , thyroid  nl, carotid no bruit Chest - symmetrical excursion , unlabored           Heart/CV- RRR , no murmur  , no gallop  , no rub, nl s1 s2                           - JVD- none , edema- none, stasis changes- none, varices- none           Lung- clear to P&A, wheeze- none, cough- none , dullness-none, rub- none           Chest wall-  Abd-  Br/ Gen/ Rectal- Not done, not indicated Extrem- cyanosis- none, clubbing, none, atrophy- none, strength- nl,  + superficial varices Neuro- grossly intact to observation

## 2024-04-27 NOTE — Patient Instructions (Signed)
 Glad you are doing well! Please call if we can help or need refills.  At checkout, ask the front desk to bring you back with a lung doctor

## 2024-05-05 ENCOUNTER — Encounter: Payer: Self-pay | Admitting: Internal Medicine

## 2024-05-10 ENCOUNTER — Other Ambulatory Visit: Payer: Self-pay | Admitting: Internal Medicine

## 2024-08-09 ENCOUNTER — Ambulatory Visit (HOSPITAL_COMMUNITY): Admit: 2024-08-09 | Admitting: Orthopedic Surgery

## 2024-08-09 SURGERY — ARTHROPLASTY, KNEE, TOTAL
Anesthesia: Choice | Site: Knee | Laterality: Left
